# Patient Record
Sex: Female | Born: 1995 | Race: Black or African American | Hispanic: No | Marital: Single | Smoking: Never smoker
Health system: Southern US, Community
[De-identification: ages and names within clinical notes are randomized; demographics above are authoritative.]

## PROBLEM LIST (undated history)

## (undated) DIAGNOSIS — D649 Anemia, unspecified: Secondary | ICD-10-CM

## (undated) DIAGNOSIS — L732 Hidradenitis suppurativa: Secondary | ICD-10-CM

## (undated) DIAGNOSIS — R7303 Prediabetes: Secondary | ICD-10-CM

## (undated) DIAGNOSIS — J45909 Unspecified asthma, uncomplicated: Secondary | ICD-10-CM

## (undated) DIAGNOSIS — R011 Cardiac murmur, unspecified: Secondary | ICD-10-CM

## (undated) DIAGNOSIS — Z789 Other specified health status: Secondary | ICD-10-CM

## (undated) DIAGNOSIS — E7800 Pure hypercholesterolemia, unspecified: Secondary | ICD-10-CM

## (undated) HISTORY — DX: Cardiac murmur, unspecified: R01.1

## (undated) HISTORY — DX: Anemia, unspecified: D64.9

## (undated) HISTORY — PX: TONSILLECTOMY: SUR1361

## (undated) HISTORY — PX: DENTAL SURGERY: SHX609

---
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
## 2014-02-09 ENCOUNTER — Encounter (HOSPITAL_COMMUNITY): Payer: Self-pay | Admitting: *Deleted

## 2014-02-09 ENCOUNTER — Inpatient Hospital Stay (HOSPITAL_COMMUNITY)
Admission: AD | Admit: 2014-02-09 | Discharge: 2014-02-09 | Disposition: A | Discharge status: Home / Self Care | Payer: 59 | Source: Ambulatory Visit | Attending: Obstetrics & Gynecology | Admitting: Obstetrics & Gynecology

## 2014-02-09 DIAGNOSIS — R197 Diarrhea, unspecified: Secondary | ICD-10-CM | POA: Diagnosis not present

## 2014-02-09 DIAGNOSIS — R109 Unspecified abdominal pain: Secondary | ICD-10-CM

## 2014-02-09 DIAGNOSIS — Z87891 Personal history of nicotine dependence: Secondary | ICD-10-CM | POA: Insufficient documentation

## 2014-02-09 HISTORY — DX: Other specified health status: Z78.9

## 2014-02-09 LAB — URINE MICROSCOPIC-ADD ON

## 2014-02-09 LAB — CBC WITH DIFFERENTIAL/PLATELET
BASOS PCT: 0 % (ref 0–1)
Basophils Absolute: 0 10*3/uL (ref 0.0–0.1)
Eosinophils Absolute: 0 10*3/uL (ref 0.0–0.7)
Eosinophils Relative: 0 % (ref 0–5)
HCT: 35.5 % — ABNORMAL LOW (ref 36.0–46.0)
HEMOGLOBIN: 11.3 g/dL — AB (ref 12.0–15.0)
Lymphocytes Relative: 15 % (ref 12–46)
Lymphs Abs: 1.6 10*3/uL (ref 0.7–4.0)
MCH: 22.5 pg — ABNORMAL LOW (ref 26.0–34.0)
MCHC: 31.8 g/dL (ref 30.0–36.0)
MCV: 70.7 fL — ABNORMAL LOW (ref 78.0–100.0)
MONO ABS: 0.7 10*3/uL (ref 0.1–1.0)
MONOS PCT: 6 % (ref 3–12)
NEUTROS ABS: 8.3 10*3/uL — AB (ref 1.7–7.7)
Neutrophils Relative %: 79 % — ABNORMAL HIGH (ref 43–77)
Platelets: 324 10*3/uL (ref 150–400)
RBC: 5.02 MIL/uL (ref 3.87–5.11)
RDW: 16.9 % — ABNORMAL HIGH (ref 11.5–15.5)
WBC: 10.6 10*3/uL — ABNORMAL HIGH (ref 4.0–10.5)

## 2014-02-09 LAB — URINALYSIS, ROUTINE W REFLEX MICROSCOPIC
Bilirubin Urine: NEGATIVE
Glucose, UA: NEGATIVE mg/dL
Ketones, ur: NEGATIVE mg/dL
Nitrite: NEGATIVE
Protein, ur: NEGATIVE mg/dL
Specific Gravity, Urine: 1.025 (ref 1.005–1.030)
Urobilinogen, UA: 0.2 mg/dL (ref 0.0–1.0)
pH: 6 (ref 5.0–8.0)

## 2014-02-09 LAB — POCT PREGNANCY, URINE: Preg Test, Ur: NEGATIVE

## 2014-02-09 MED ORDER — KETOROLAC TROMETHAMINE 60 MG/2ML IM SOLN
60.0000 mg | Freq: Once | INTRAMUSCULAR | Status: AC
  Administered 2014-02-09: 60 mg via INTRAMUSCULAR
  Filled 2014-02-09: qty 2

## 2014-02-09 NOTE — MAU Provider Note (Signed)
History     CSN: [id number]  Arrival date and time: 02/09/14 0707   First Provider Initiated Contact with Patient 02/09/14 0759      Chief Complaint  Patient presents with  . Abdominal Pain   HPI Rebecca Nicholson 18 y.o. Client comes to MAU with abdominal pain since 10 pm yesterday.  Had 2 burgers yesterday and after eating at 9 pm had gas and then vomiting.  Has had continues lower abdominal pain since last night and has had several soft but not watery diarrhea stools.  Is not in pain while sitting up.  Did not sleep last night due to the abdominal pain.  Has had some vaginal spotting this week.  Is current on her Depo.  Denies any urinary urgency, frequency or pain with urination.  Is accompanied by her mother.  OB History   Grav Para Term Preterm Abortions TAB SAB Ect Mult Living                  Past Medical History  Diagnosis Date  . Medical history non-contributory     Past Surgical History  Procedure Laterality Date  . Tonsillectomy      Family History  Problem Relation Age of Onset  . Hypertension Mother   . Diabetes Mother     History  Substance Use Topics  . Smoking status: Former Games developer  . Smokeless tobacco: Never Used  . Alcohol Use: No    Allergies: No Known Allergies  Prescriptions prior to admission  Medication Sig Dispense Refill  . albuterol (PROVENTIL HFA;VENTOLIN HFA) 108 (90 BASE) MCG/ACT inhaler Inhale 2 puffs into the lungs every 6 (six) hours as needed for wheezing.      Marland Kitchen estradiol cypionate (DEPO-ESTRADIOL) 5 MG/ML injection Inject into the muscle every 28 (twenty-eight) days.        Review of Systems  Constitutional: Negative for fever.  Gastrointestinal: Positive for nausea, vomiting, abdominal pain and diarrhea.  Genitourinary: Negative for urgency and frequency.       No vaginal discharge. Small amount of vaginal bleeding. No dysuria.   Physical Exam   Blood pressure 131/71, pulse 85, temperature 99.1 F (37.3 C),  temperature source Oral, resp. rate 18.  Physical Exam  Nursing note and vitals reviewed. Constitutional: She is oriented to person, place, and time. She appears well-developed and well-nourished. No distress.  HENT:  Head: Normocephalic.  Eyes: EOM are normal.  Neck: Neck supple.  GI: Soft. There is no tenderness. There is no rebound and no guarding.  Musculoskeletal: Normal range of motion.  Neurological: She is alert and oriented to person, place, and time.  Skin: Skin is warm and dry.  Psychiatric: She has a normal mood and affect.    MAU Course  Procedures Results for orders placed during the hospital encounter of 02/09/14 (from the past 24 hour(s))  URINALYSIS, ROUTINE W REFLEX MICROSCOPIC     Status: Abnormal   Collection Time    02/09/14  7:15 AM      Result Value Ref Range   Color, Urine YELLOW  YELLOW   APPearance HAZY (*) CLEAR   Specific Gravity, Urine 1.025  1.005 - 1.030   pH 6.0  5.0 - 8.0   Glucose, UA NEGATIVE  NEGATIVE mg/dL   Hgb urine dipstick LARGE (*) NEGATIVE   Bilirubin Urine NEGATIVE  NEGATIVE   Ketones, ur NEGATIVE  NEGATIVE mg/dL   Protein, ur NEGATIVE  NEGATIVE mg/dL   Urobilinogen, UA 0.2  0.0 -  1.0 mg/dL   Nitrite NEGATIVE  NEGATIVE   Leukocytes, UA SMALL (*) NEGATIVE  URINE MICROSCOPIC-ADD ON     Status: Abnormal   Collection Time    02/09/14  7:15 AM      Result Value Ref Range   Squamous Epithelial / LPF MANY (*) RARE   WBC, UA 11-20  <3 WBC/hpf   RBC / HPF 0-2  <3 RBC/hpf   Bacteria, UA FEW (*) RARE   Urine-Other MUCOUS PRESENT    POCT PREGNANCY, URINE     Status: None   Collection Time    02/09/14  7:22 AM      Result Value Ref Range   Preg Test, Ur NEGATIVE  NEGATIVE  CBC WITH DIFFERENTIAL     Status: Abnormal   Collection Time    02/09/14  8:56 AM      Result Value Ref Range   WBC 10.6 (*) 4.0 - 10.5 K/uL   RBC 5.02  3.87 - 5.11 MIL/uL   Hemoglobin 11.3 (*) 12.0 - 15.0 g/dL   HCT 16.1 (*) 09.6 - 04.5 %   MCV 70.7 (*) 78.0 -  100.0 fL   MCH 22.5 (*) 26.0 - 34.0 pg   MCHC 31.8  30.0 - 36.0 g/dL   RDW 40.9 (*) 81.1 - 91.4 %   Platelets 324  150 - 400 K/uL   Neutrophils Relative % 79 (*) 43 - 77 %   Neutro Abs 8.3 (*) 1.7 - 7.7 K/uL   Lymphocytes Relative 15  12 - 46 %   Lymphs Abs 1.6  0.7 - 4.0 K/uL   Monocytes Relative 6  3 - 12 %   Monocytes Absolute 0.7  0.1 - 1.0 K/uL   Eosinophils Relative 0  0 - 5 %   Eosinophils Absolute 0.0  0.0 - 0.7 K/uL   Basophils Relative 0  0 - 1 %   Basophils Absolute 0.0  0.0 - 0.1 K/uL    MDM Discussed at length symptoms of concern - continued vomiting, body aches, fever - and BRAT diet.  Discussed seeking additional medical care with worsening symptoms.  Will give Toradol IM for pain and send client home.  Will not do pelvic today, but if symptoms continue, would then advise pelvic exam.  With no symptoms of UTI will do urine culture based on urinalysis results.    0945  Toradol is helping her pain.  Note for work given.  Assessment and Plan  Abdominal pain  Diarrhea Possible GI viral illness  Plan BRAT diet Keep well hydrated with fluids May try OTC Immodium by the package directions if diarrhea continues. Return if your symptoms worsen or if you develop fever. Urine culture pending.  Rebecca Nicholson 02/09/2014, 9:40 AM

## 2014-02-09 NOTE — MAU Note (Signed)
Pt scanned and medication scanned. Scanner beeped like it accepted medication scanned, however system did not acknowledge med was scanned.

## 2014-02-09 NOTE — MAU Note (Signed)
PT presents to MAU with complaints of pain in her lower abdomen since last night. Denies any vaginal bleeding or discharge. Reports depo shot for birth control

## 2014-02-09 NOTE — Discharge Instructions (Signed)
BRAT diet Keep well hydrated with fluids May try OTC Immodium by the package directions if diarrhea continues. Return if your symptoms worsen or if you develop fever.

## 2014-02-11 LAB — URINE CULTURE: Special Requests: NORMAL

## 2014-04-11 ENCOUNTER — Ambulatory Visit (INDEPENDENT_AMBULATORY_CARE_PROVIDER_SITE_OTHER): Payer: Self-pay | Admitting: Surgery

## 2014-04-11 NOTE — H&P (Signed)
History of Present Illness Wilmon Arms(Aleksia Freiman K. Nhu Glasby MD; 04/11/2014 10:14 AM) Patient words: eval axillary cyst.  The patient is a 18 year old female who presents with a complaint of Mass. Referred by [name], PA at [facility] for evaluation of right axillary hidradenitis This is a healthy 18 yo female who presents with several months of intermitent swelling, tenderness, and spontaneous drainage of purulent fluid from her right axilla. Currently, she can feel a couple of masses in this area, but no inflammation or drainage. She was referred for surgical evaluation. Other Problems [name]([name], [place]; 04/11/2014 9:41 AM) Asthma  Past Surgical History [name]([name], [place]; 04/11/2014 9:41 AM) Tonsillectomy  Diagnostic Studies History [name](Kendall [place], [place]; 04/11/2014 9:41 AM) Colonoscopy never Mammogram never  Allergies [name]([name], [place]; 04/11/2014 9:42 AM) No Known Drug Allergies 04/11/2014  Medication History [name]([name], [place]; 04/11/2014 9:42 AM) MedroxyPROGESTERone Acetate (150MG /ML Suspension, Intramuscular as direct) Active. Albuterol Sulfate (108 (90 Base)MCG/ACT Aero Pow Br Act, Inhalation prn) Active.  Social History [name](Kendall [place], [place]; 04/11/2014 9:41 AM) Caffeine use Carbonated beverages, Tea. No alcohol use No drug use Tobacco use Never smoker.  Family History [name](Kendall [place], [place]; 04/11/2014 9:41 AM) Breast Cancer Family Members In General. Colon Cancer Family Members In General. Diabetes Mellitus Family Members In General, Mother.  Pregnancy / Birth History [name](Kendall [place], [place]; 04/11/2014 9:41 AM) Age at menarche 12 years. Contraceptive History Depo-provera. Gravida 0 Irregular periods Para 0     Review of Systems [name](Kendall [place]; 04/11/2014 9:41 AM) General Not Present- Appetite Loss, Chills, Fatigue, Fever, Night Sweats, Weight Gain and Weight Loss. Skin Present- New Lesions. Not Present- Change in Wart/Mole, Dryness, Hives,  Jaundice, Non-Healing Wounds, Rash and Ulcer. HEENT Not Present- Earache, Hearing Loss, Hoarseness, Nose Bleed, Oral Ulcers, Ringing in the Ears, Seasonal Allergies, Sinus Pain, Sore Throat, Visual Disturbances, Wears glasses/contact lenses and Yellow Eyes. Respiratory Not Present- Bloody sputum, Chronic Cough, Difficulty Breathing, Snoring and Wheezing. Breast Not Present- Breast Mass, Breast Pain, Nipple Discharge and Skin Changes. Cardiovascular Not Present- Chest Pain, Difficulty Breathing Lying Down, Leg Cramps, Palpitations, Rapid Heart Rate, Shortness of Breath and Swelling of Extremities. Gastrointestinal Not Present- Abdominal Pain, Bloating, Bloody Stool, Change in Bowel Habits, Chronic diarrhea, Constipation, Difficulty Swallowing, Excessive gas, Gets full quickly at meals, Hemorrhoids, Indigestion, Nausea, Rectal Pain and Vomiting. Female Genitourinary Not Present- Frequency, Nocturia, Painful Urination, Pelvic Pain and Urgency. Musculoskeletal Not Present- Back Pain, Joint Pain, Joint Stiffness, Muscle Pain, Muscle Weakness and Swelling of Extremities. Neurological Not Present- Decreased Memory, Fainting, Headaches, Numbness, Seizures, Tingling, Tremor, Trouble walking and Weakness. Psychiatric Not Present- Anxiety, Bipolar, Change in Sleep Pattern, Depression, Fearful and Frequent crying. Endocrine Not Present- Cold Intolerance, Excessive Hunger, Hair Changes, Heat Intolerance, Hot flashes and New Diabetes. Hematology Not Present- Easy Bruising, Excessive bleeding, Gland problems, HIV and Persistent Infections.  Vitals [name](Kendall [place]; 04/11/2014 9:42 AM) 04/11/2014 9:42 AM Weight: 227 lb Height: 67in Body Surface Area: 2.21 m Body Mass Index: 35.55 kg/m Temp.: 98.77F(Oral)  Pulse: 60 (Regular)  Resp.: 14 (Unlabored)  BP: 116/78 (Sitting, Left Arm, Standard)     Physical Exam [name](Katoya Amato K. Syncere Eble MD; 04/11/2014 10:15 AM)  The physical exam findings are as  follows: Note:WDWN in NAD HEENT: EOMI, sclera anicteric Neck: No masses, no thyromegaly Right axilla - lipoma vs. accessory breast tissue - right axilla - 4 cm; anterior to this fatty tissue, there are three palpable subcutaneous masses, each about 1 cm. No inflammation or drainage. No tenderness. One of them  shows signs of rupture, and subsequent healing. Lungs: CTA bilaterally; normal respiratory effort CV: Regular rate and rhythm; no murmurs Abd: +bowel sounds, soft, non-tender, no masses Ext: Well-perfused; no edema Skin: Warm, dry; no sign of jaundice    Assessment & Plan [name](Iain Sawchuk K. 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 The surgical procedure has been discussed with the patient. Potential risks, benefits, alternative treatments, and expected outcomes have been explained. All of the patient's questions at this time have been answered. The likelihood of reaching the patient's treatment goal is good. The patient understand the proposed surgical procedure and wishes to proceed. Note:Currently not infected, but palpable cysts in axilla  Wilmon ArmsMatthew K. Corliss Skainssuei, MD, [facility]  General/ Trauma Surgery  04/11/2014 10:16 AM

## 2014-06-04 ENCOUNTER — Encounter (HOSPITAL_COMMUNITY): Payer: Self-pay | Admitting: *Deleted

## 2014-06-10 ENCOUNTER — Ambulatory Visit (HOSPITAL_BASED_OUTPATIENT_CLINIC_OR_DEPARTMENT_OTHER): Payer: 59 | Admitting: Anesthesiology

## 2014-06-10 ENCOUNTER — Ambulatory Visit (HOSPITAL_COMMUNITY)
Admission: RE | Admit: 2014-06-10 | Discharge: 2014-06-10 | Disposition: A | Discharge status: Home / Self Care | Payer: 59 | Source: Ambulatory Visit | Attending: Surgery | Admitting: Surgery

## 2014-06-10 ENCOUNTER — Encounter (HOSPITAL_BASED_OUTPATIENT_CLINIC_OR_DEPARTMENT_OTHER): Payer: Self-pay | Admitting: Anesthesiology

## 2014-06-10 ENCOUNTER — Encounter (HOSPITAL_BASED_OUTPATIENT_CLINIC_OR_DEPARTMENT_OTHER)
Admission: RE | Disposition: A | Discharge status: Home / Self Care | Payer: Self-pay | Source: Ambulatory Visit | Attending: Surgery

## 2014-06-10 DIAGNOSIS — Z79899 Other long term (current) drug therapy: Secondary | ICD-10-CM | POA: Diagnosis not present

## 2014-06-10 DIAGNOSIS — J45909 Unspecified asthma, uncomplicated: Secondary | ICD-10-CM | POA: Diagnosis not present

## 2014-06-10 DIAGNOSIS — L732 Hidradenitis suppurativa: Secondary | ICD-10-CM | POA: Insufficient documentation

## 2014-06-10 DIAGNOSIS — R2231 Localized swelling, mass and lump, right upper limb: Secondary | ICD-10-CM | POA: Diagnosis present

## 2014-06-10 HISTORY — DX: Hidradenitis suppurativa: L73.2

## 2014-06-10 HISTORY — DX: Unspecified asthma, uncomplicated: J45.909

## 2014-06-10 HISTORY — PX: HYDRADENITIS EXCISION: SHX5243

## 2014-06-10 LAB — POCT HEMOGLOBIN-HEMACUE: Hemoglobin: 11.7 g/dL — ABNORMAL LOW (ref 12.0–15.0)

## 2014-06-10 SURGERY — EXCISION, HIDRADENITIS, AXILLA
Anesthesia: General | Site: Axilla | Laterality: Right

## 2014-06-10 MED ORDER — CEFAZOLIN SODIUM-DEXTROSE 2-3 GM-% IV SOLR: 2.0000 g | INTRAVENOUS | Status: DC

## 2014-06-10 MED ORDER — SCOPOLAMINE 1 MG/3DAYS TD PT72
MEDICATED_PATCH | TRANSDERMAL | Status: AC
  Filled 2014-06-10: qty 1

## 2014-06-10 MED ORDER — SCOPOLAMINE 1 MG/3DAYS TD PT72
1.0000 | MEDICATED_PATCH | TRANSDERMAL | Status: DC
  Administered 2014-06-10: 1.5 mg via TRANSDERMAL

## 2014-06-10 MED ORDER — ONDANSETRON HCL 4 MG/2ML IJ SOLN: 4.0000 mg | INTRAMUSCULAR | Status: DC | PRN

## 2014-06-10 MED ORDER — OXYCODONE HCL 5 MG/5ML PO SOLN: 5.0000 mg | ORAL | Status: DC | PRN

## 2014-06-10 MED ORDER — FENTANYL CITRATE 0.05 MG/ML IJ SOLN: 50.0000 ug | INTRAMUSCULAR | Status: DC | PRN

## 2014-06-10 MED ORDER — MORPHINE SULFATE 2 MG/ML IJ SOLN: 2.0000 mg | INTRAMUSCULAR | Status: DC | PRN

## 2014-06-10 MED ORDER — HYDROMORPHONE HCL 1 MG/ML IJ SOLN: 0.2500 mg | INTRAMUSCULAR | Status: DC | PRN

## 2014-06-10 MED ORDER — LACTATED RINGERS IV SOLN
INTRAVENOUS | Status: DC
  Administered 2014-06-10 (×2): via INTRAVENOUS

## 2014-06-10 MED ORDER — CEFAZOLIN SODIUM-DEXTROSE 2-3 GM-% IV SOLR
INTRAVENOUS | Status: DC | PRN
  Administered 2014-06-10: 2 g via INTRAVENOUS

## 2014-06-10 MED ORDER — FENTANYL CITRATE 0.05 MG/ML IJ SOLN
INTRAMUSCULAR | Status: DC | PRN
  Administered 2014-06-10 (×4): 50 ug via INTRAVENOUS

## 2014-06-10 MED ORDER — MIDAZOLAM HCL 5 MG/5ML IJ SOLN
INTRAMUSCULAR | Status: DC | PRN
  Administered 2014-06-10: 2 mg via INTRAVENOUS

## 2014-06-10 MED ORDER — OXYCODONE HCL 5 MG/5ML PO SOLN: 5.0000 mg | Freq: Once | ORAL | Status: DC | PRN

## 2014-06-10 MED ORDER — MIDAZOLAM HCL 2 MG/2ML IJ SOLN: 1.0000 mg | INTRAMUSCULAR | Status: DC | PRN

## 2014-06-10 MED ORDER — CEFAZOLIN SODIUM-DEXTROSE 2-3 GM-% IV SOLR
INTRAVENOUS | Status: AC
  Filled 2014-06-10: qty 50

## 2014-06-10 MED ORDER — LIDOCAINE HCL (CARDIAC) 20 MG/ML IV SOLN
INTRAVENOUS | Status: DC | PRN
  Administered 2014-06-10: 80 mg via INTRAVENOUS

## 2014-06-10 MED ORDER — DEXAMETHASONE SODIUM PHOSPHATE 4 MG/ML IJ SOLN
INTRAMUSCULAR | Status: DC | PRN
  Administered 2014-06-10: 10 mg via INTRAVENOUS

## 2014-06-10 MED ORDER — ONDANSETRON HCL 4 MG/2ML IJ SOLN: 4.0000 mg | Freq: Once | INTRAMUSCULAR | Status: DC | PRN

## 2014-06-10 MED ORDER — OXYCODONE HCL 5 MG PO TABS: 5.0000 mg | ORAL_TABLET | Freq: Once | ORAL | Status: DC | PRN

## 2014-06-10 MED ORDER — CHLORHEXIDINE GLUCONATE 4 % EX LIQD: 1.0000 "application " | Freq: Once | CUTANEOUS | Status: DC

## 2014-06-10 MED ORDER — FENTANYL CITRATE 0.05 MG/ML IJ SOLN
INTRAMUSCULAR | Status: AC
  Filled 2014-06-10: qty 6

## 2014-06-10 MED ORDER — MIDAZOLAM HCL 2 MG/2ML IJ SOLN
INTRAMUSCULAR | Status: AC
  Filled 2014-06-10: qty 2

## 2014-06-10 MED ORDER — HYDROCODONE-ACETAMINOPHEN 7.5-325 MG/15ML PO SOLN: 15.0000 mL | Freq: Four times a day (QID) | ORAL | Status: AC | PRN

## 2014-06-10 MED ORDER — PROPOFOL 10 MG/ML IV EMUL
INTRAVENOUS | Status: AC
  Filled 2014-06-10: qty 50

## 2014-06-10 MED ORDER — PROPOFOL 10 MG/ML IV BOLUS
INTRAVENOUS | Status: AC
  Filled 2014-06-10: qty 80

## 2014-06-10 MED ORDER — BUPIVACAINE-EPINEPHRINE 0.25% -1:200000 IJ SOLN
INTRAMUSCULAR | Status: DC | PRN
  Administered 2014-06-10: 10 mL

## 2014-06-10 MED ORDER — ONDANSETRON HCL 4 MG/2ML IJ SOLN
INTRAMUSCULAR | Status: DC | PRN
  Administered 2014-06-10: 4 mg via INTRAVENOUS

## 2014-06-10 MED ORDER — PROPOFOL 10 MG/ML IV BOLUS
INTRAVENOUS | Status: DC | PRN
  Administered 2014-06-10: 50 mg via INTRAVENOUS
  Administered 2014-06-10: 200 mg via INTRAVENOUS
  Administered 2014-06-10: 50 mg via INTRAVENOUS

## 2014-06-10 SURGICAL SUPPLY — 44 items
BENZOIN TINCTURE PRP APPL 2/3 (GAUZE/BANDAGES/DRESSINGS) ×3 IMPLANT
BLADE HEX COATED 2.75 (ELECTRODE) ×3 IMPLANT
BLADE SURG 15 STRL LF DISP TIS (BLADE) ×1 IMPLANT
BLADE SURG 15 STRL SS (BLADE) ×2
CANISTER SUCT 1200ML W/VALVE (MISCELLANEOUS) ×3 IMPLANT
CHLORAPREP W/TINT 26ML (MISCELLANEOUS) ×3 IMPLANT
CLOSURE WOUND 1/2 X4 (GAUZE/BANDAGES/DRESSINGS) ×1
COVER BACK TABLE 60X90IN (DRAPES) ×3 IMPLANT
COVER MAYO STAND STRL (DRAPES) ×3 IMPLANT
DECANTER SPIKE VIAL GLASS SM (MISCELLANEOUS) IMPLANT
DRAPE PED LAPAROTOMY (DRAPES) ×3 IMPLANT
DRAPE UTILITY XL STRL (DRAPES) ×3 IMPLANT
DRSG TEGADERM 2-3/8X2-3/4 SM (GAUZE/BANDAGES/DRESSINGS) ×3 IMPLANT
DRSG TEGADERM 4X4.75 (GAUZE/BANDAGES/DRESSINGS) ×3 IMPLANT
ELECT REM PT RETURN 9FT ADLT (ELECTROSURGICAL) ×3
ELECTRODE REM PT RTRN 9FT ADLT (ELECTROSURGICAL) ×1 IMPLANT
GAUZE XEROFORM 1X8 LF (GAUZE/BANDAGES/DRESSINGS) IMPLANT
GLOVE BIO SURGEON STRL SZ7 (GLOVE) ×3 IMPLANT
GLOVE BIOGEL PI IND STRL 7.0 (GLOVE) ×1 IMPLANT
GLOVE BIOGEL PI IND STRL 7.5 (GLOVE) ×1 IMPLANT
GLOVE BIOGEL PI INDICATOR 7.0 (GLOVE) ×2
GLOVE BIOGEL PI INDICATOR 7.5 (GLOVE) ×2
GLOVE ECLIPSE 6.5 STRL STRAW (GLOVE) ×3 IMPLANT
GOWN STRL REUS W/ TWL LRG LVL3 (GOWN DISPOSABLE) ×2 IMPLANT
GOWN STRL REUS W/TWL LRG LVL3 (GOWN DISPOSABLE) ×4
NEEDLE HYPO 25X1 1.5 SAFETY (NEEDLE) ×3 IMPLANT
NS IRRIG 1000ML POUR BTL (IV SOLUTION) ×3 IMPLANT
PACK BASIN DAY SURGERY FS (CUSTOM PROCEDURE TRAY) ×3 IMPLANT
PENCIL BUTTON HOLSTER BLD 10FT (ELECTRODE) ×3 IMPLANT
SLEEVE SCD COMPRESS KNEE MED (MISCELLANEOUS) ×3 IMPLANT
SPONGE GAUZE 4X4 12PLY STER LF (GAUZE/BANDAGES/DRESSINGS) ×3 IMPLANT
SPONGE LAP 4X18 X RAY DECT (DISPOSABLE) ×3 IMPLANT
STRIP CLOSURE SKIN 1/2X4 (GAUZE/BANDAGES/DRESSINGS) ×2 IMPLANT
SUT ETHILON 3 0 PS 1 (SUTURE) IMPLANT
SUT MON AB 4-0 PC3 18 (SUTURE) ×3 IMPLANT
SUT VIC AB 3-0 SH 27 (SUTURE) ×2
SUT VIC AB 3-0 SH 27X BRD (SUTURE) ×1 IMPLANT
SYR BULB 3OZ (MISCELLANEOUS) IMPLANT
SYR CONTROL 10ML LL (SYRINGE) ×3 IMPLANT
TOWEL OR 17X24 6PK STRL BLUE (TOWEL DISPOSABLE) ×3 IMPLANT
TOWEL OR NON WOVEN STRL DISP B (DISPOSABLE) IMPLANT
TUBE CONNECTING 20'X1/4 (TUBING) ×1
TUBE CONNECTING 20X1/4 (TUBING) ×2 IMPLANT
YANKAUER SUCT BULB TIP NO VENT (SUCTIONS) ×3 IMPLANT

## 2014-06-10 NOTE — Transfer of Care (Signed)
Immediate Anesthesia Transfer of Care Note  Patient: Rebecca LeitzKristen Imani Drohan  Procedure(s) Performed: Procedure(s): EXCISION OF RIGHT AXILLARY HIDRADENITIS  (Right)  Patient Location: PACU  Anesthesia Type:General  Level of Consciousness: sedated and patient cooperative  Airway & Oxygen Therapy: Patient Spontanous Breathing and Patient connected to face mask oxygen  Post-op Assessment: Report given to PACU RN and Post -op Vital signs reviewed and stable  Post vital signs: Reviewed and stable  Complications: No apparent anesthesia complications

## 2014-06-10 NOTE — Op Note (Signed)
Pre-op Diagnosis:  Hidradenitis - right axilla Post-op Diagnosis:  Same Procedure:  Excision of hidradenitis - right axilla Surgeon:  Donnisha Besecker K. Anesthesia:  Gen - LMA Indications:  19 yo female with intermittent swelling, tenderness, and purulent drainage from her right axilla.  She was examined and several small subcutaneous masses were palpated.  She presents now for excision of this area.  Description of procedure:  The patient was brought to the operating room and placed in a supine position on the operating room table.  After an adequate level of anesthesia was obtained, her right axilla was prepped with Chloraprep and draped in sterile fashion. A timeout was taken to ensure the proper patient and proper procedure. We made an elliptical incision around the chronic scar tissue in the axilla. This resulted in the decision about 3/2 cm long. We excised all of the underlying scar tissue down to normal-appearing fatty tissue. The patient has some excess accessory breast tissue in the axilla that is protruding significantly. We excised some of this to try to make her axilla a little more flat. We inspected carefully for hemostasis. All of the excess tissue was sent for pathologic examination. The wound was closed with multiple deep 3-0 Vicryl sutures. 4 Monocryl was used close the skin. Steri-Strips and clean dressing were applied. The patient was brought to recovery in stable condition. All sponge, initially, and needle counts are correct.   Wilmon ArmsMatthew K. Corliss Skainssuei, MD, [facility]  General/ Trauma Surgery  06/10/2014 1:37 PM

## 2014-06-10 NOTE — H&P (Signed)
History of Present Illness Rebecca Nicholson(Rebecca Nicholson K. Rebecca Slifer MD; 04/11/2014 10:14 AM) Patient words: eval axillary cyst.  The patient is a 19 year old female who presents with a complaint of Mass. Referred by Rebecca LeberElmira Powell, PA at [facility] for evaluation of right axillary hidradenitis This is a healthy 19 yo female who presents with several months of intermitent swelling, tenderness, and spontaneous drainage of purulent fluid from her right axilla. Currently, she can feel a couple of masses in this area, but no inflammation or drainage. She was referred for surgical evaluation. Other Problems Rebecca Nicholson(Rebecca Nicholson, Rebecca Nicholson; 04/11/2014 9:41 AM) Asthma  Past Surgical History Rebecca Nicholson(Rebecca Nicholson, Rebecca Nicholson; 04/11/2014 9:41 AM) Tonsillectomy  Diagnostic Studies History Rebecca Nicholson(Rebecca AshleyWarmath, Rebecca Nicholson; 04/11/2014 9:41 AM) Colonoscopy never Mammogram never  Allergies Rebecca Nicholson(Rebecca Warmath, MA; 04/11/2014 9:42 AM) No Known Drug Allergies 04/11/2014  Medication History Rebecca Nicholson(Rebecca Warmath, MA; 04/11/2014 9:42 AM) MedroxyPROGESTERone Acetate (150MG /ML Suspension, Intramuscular as direct) Active. Albuterol Sulfate (108 (90 Base)MCG/ACT Aero Pow Br Act, Inhalation prn) Active.  Social History Rebecca Nicholson(Rebecca PoloWarmath, Rebecca Nicholson; 04/11/2014 9:41 AM) Caffeine use Carbonated beverages, Tea. No alcohol use No drug use Tobacco use Never smoker.  Family History Rebecca Nicholson(Rebecca ShiroWarmath, Rebecca Nicholson; 04/11/2014 9:41 AM) Breast Cancer Family Members In General. Colon Cancer Family Members In General. Diabetes Mellitus Family Members In General, Mother.  Pregnancy / Birth History Rebecca Nicholson(Rebecca NobleWarmath, Rebecca Nicholson; 04/11/2014 9:41 AM) Age at menarche 12 years. Contraceptive History Depo-provera. Gravida 0 Irregular periods Para 0     Review of Systems Rebecca Nicholson(Rebecca ValmyWarmath MA; 04/11/2014 9:41 AM) General Not Present- Appetite Loss, Chills, Fatigue, Fever, Night Sweats, Weight Gain and Weight Loss. Skin Present- New Lesions. Not Present- Change in Wart/Mole, Dryness, Hives,  Jaundice, Non-Healing Wounds, Rash and Ulcer. HEENT Not Present- Earache, Hearing Loss, Hoarseness, Nose Bleed, Oral Ulcers, Ringing in the Ears, Seasonal Allergies, Sinus Pain, Sore Throat, Visual Disturbances, Wears glasses/contact lenses and Yellow Eyes. Respiratory Not Present- Bloody sputum, Chronic Cough, Difficulty Breathing, Snoring and Wheezing. Breast Not Present- Breast Mass, Breast Pain, Nipple Discharge and Skin Changes. Cardiovascular Not Present- Chest Pain, Difficulty Breathing Lying Down, Leg Cramps, Palpitations, Rapid Heart Rate, Shortness of Breath and Swelling of Extremities. Gastrointestinal Not Present- Abdominal Pain, Bloating, Bloody Stool, Change in Bowel Habits, Chronic diarrhea, Constipation, Difficulty Swallowing, Excessive gas, Gets full quickly at meals, Hemorrhoids, Indigestion, Nausea, Rectal Pain and Vomiting. Female Genitourinary Not Present- Frequency, Nocturia, Painful Urination, Pelvic Pain and Urgency. Musculoskeletal Not Present- Back Pain, Joint Pain, Joint Stiffness, Muscle Pain, Muscle Weakness and Swelling of Extremities. Neurological Not Present- Decreased Memory, Fainting, Headaches, Numbness, Seizures, Tingling, Tremor, Trouble walking and Weakness. Psychiatric Not Present- Anxiety, Bipolar, Change in Sleep Pattern, Depression, Fearful and Frequent crying. Endocrine Not Present- Cold Intolerance, Excessive Hunger, Hair Changes, Heat Intolerance, Hot flashes and New Diabetes. Hematology Not Present- Easy Bruising, Excessive bleeding, Gland problems, HIV and Persistent Infections.  Vitals Rebecca Nicholson(Rebecca North LakesWarmath MA; 04/11/2014 9:42 AM) 04/11/2014 9:42 AM Weight: 227 lb Height: 67in Body Surface Area: 2.21 m Body Mass Index: 35.55 kg/m Temp.: 98.69F(Oral)  Pulse: 60 (Regular)  Resp.: 14 (Unlabored)  BP: 116/78 (Sitting, Left Arm, Standard)     Physical Exam Rebecca Nicholson(Rebecca Nicholson K. Rebecca Kazanjian MD; 04/11/2014 10:15 AM)  The physical exam findings are as  follows: Note:WDWN in NAD HEENT: EOMI, sclera anicteric Neck: No masses, no thyromegaly Right axilla - lipoma vs. accessory breast tissue - right axilla - 4 cm; anterior to this fatty tissue, there are three palpable subcutaneous masses, each about 1 cm. No inflammation or drainage. No tenderness. One of them  shows signs of rupture, and subsequent healing. Lungs: CTA bilaterally; normal respiratory effort CV: Regular rate and rhythm; no murmurs Abd: +bowel sounds, soft, non-tender, no masses Ext: Well-perfused; no edema Skin: Warm, dry; no sign of jaundice    Assessment & Plan Rebecca Nicholson K. 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 The surgical procedure has been discussed with the patient. Potential risks, benefits, alternative treatments, and expected outcomes have been explained. All of the patient's questions at this time have been answered. The likelihood of reaching the patient's treatment goal is good. The patient understand the proposed surgical procedure and wishes to proceed. Note:Currently not infected, but palpable cysts in axilla   Rebecca Nicholson. Rebecca Skains, MD, [facility]  General/ Trauma Surgery  06/10/2014 12:37 PM

## 2014-06-10 NOTE — Anesthesia Procedure Notes (Signed)
Procedure Name: LMA Insertion Date/Time: 06/10/2014 12:48 PM Performed by: Genevieve NorlanderLINKA, Tarquin Welcher L Pre-anesthesia Checklist: Patient identified, Emergency Drugs available, Suction available, Patient being monitored and Timeout performed Patient Re-evaluated:Patient Re-evaluated prior to inductionOxygen Delivery Method: Circle System Utilized Preoxygenation: Pre-oxygenation with 100% oxygen Intubation Type: IV induction Ventilation: Mask ventilation without difficulty LMA: LMA inserted LMA Size: 4.0 Number of attempts: 1 Airway Equipment and Method: bite block Placement Confirmation: positive ETCO2 Tube secured with: Tape Dental Injury: Teeth and Oropharynx as per pre-operative assessment

## 2014-06-10 NOTE — Anesthesia Preprocedure Evaluation (Signed)
Anesthesia Evaluation  Patient identified by MRN, date of birth, ID band Patient awake    Reviewed: Allergy & Precautions, NPO status , Patient's Chart, lab work & pertinent test results  Airway Mallampati: I  TM Distance: >3 FB Neck ROM: Full    Dental  (+) Teeth Intact, Dental Advisory Given   Pulmonary asthma ,  breath sounds clear to auscultation        Cardiovascular Rhythm:Regular Rate:Normal     Neuro/Psych    GI/Hepatic   Endo/Other    Renal/GU      Musculoskeletal   Abdominal   Peds  Hematology   Anesthesia Other Findings   Reproductive/Obstetrics                             Anesthesia Physical Anesthesia Plan  ASA: II  Anesthesia Plan: General   Post-op Pain Management:    Induction: Intravenous  Airway Management Planned: LMA  Additional Equipment:   Intra-op Plan:   Post-operative Plan: Extubation in OR  Informed Consent: I have reviewed the patients History and Physical, chart, labs and discussed the procedure including the risks, benefits and alternatives for the proposed anesthesia with the patient or authorized representative who has indicated his/her understanding and acceptance.   Dental advisory given  Plan Discussed with: CRNA, Anesthesiologist and Surgeon  Anesthesia Plan Comments:         Anesthesia Quick Evaluation  

## 2014-06-10 NOTE — Discharge Instructions (Signed)
[facility] Office Phone Number [phone]   POST OP INSTRUCTIONS  Always review your discharge instruction sheet given to you by the facility where your surgery was performed.  IF YOU HAVE DISABILITY OR FAMILY LEAVE FORMS, YOU MUST BRING THEM TO THE OFFICE FOR PROCESSING.  DO NOT GIVE THEM TO YOUR DOCTOR.  1. A prescription for pain medication may be given to you upon discharge.  Take your pain medication as prescribed, if needed.  If narcotic pain medicine is not needed, then you may take acetaminophen (Tylenol) or ibuprofen (Advil) as needed. 2. Take your usually prescribed medications unless otherwise directed 3. If you need a refill on your pain medication, please contact your pharmacy.  They will contact our office to request authorization.  Prescriptions will not be filled after 5pm or on week-ends. 4. You should eat very light the first 24 hours after surgery, such as soup, crackers, pudding, etc.  Resume your normal diet the day after surgery. 5. Most patients will experience some swelling and bruising in the axilla.  Ice packs will help.  Swelling and bruising can take several days to resolve.  6. It is common to experience some constipation if taking pain medication after surgery.  Increasing fluid intake and taking a stool softener will usually help or prevent this problem from occurring.  A mild laxative (Milk of Magnesia or Miralax) should be taken according to package directions if there are no bowel movements after 48 hours. 7. Unless discharge instructions indicate otherwise, you may remove your bandages 24-48 hours after surgery, and you may shower at that time.  You may have steri-strips (small skin tapes) in place directly over the incision.  These strips should be left on the skin for 7-10 days.  If your surgeon used skin glue on the incision, you may shower in 24 hours.  The glue will flake off over the next 2-3 weeks.  Any sutures or staples will be removed at  the office during your follow-up visit. 8. ACTIVITIES:  You may resume regular daily activities (gradually increasing) beginning the next day.   a. You may drive when you no longer are taking prescription pain medication, you can comfortably wear a seatbelt, and you can safely maneuver your car and apply brakes. b. RETURN TO WORK:  ______________________________________________________________________________________ 9. You should see your doctor in the office for a follow-up appointment approximately two weeks after your surgery.  Your doctors nurse will typically make your follow-up appointment when she calls you with your pathology report.  Expect your pathology report 2-3 business days after your surgery.  You may call to check if you do not hear from us after three days. 10. OTHER INSTRUCTIONS: _______________________________________________________________________________________________ _____________________________________________________________________________________________________________________________________ _____________________________________________________________________________________________________________________________________ _____________________________________________________________________________________________________________________________________  WHEN TO CALL YOUR DOCTOR: 1. Fever over 101.0 2. Nausea and/or vomiting. 3. Extreme swelling or bruising. 4. Continued bleeding from incision. 5. Increased pain, redness, or drainage from the incision.  The clinic staff is available to answer your questions during regular business hours.  Please dont hesitate to call and ask to speak to one of the nurses for clinical concerns.  If you have a medical emergency, go to the nearest emergency room or call 911.  [facility] from [facility] is always on call at the hospital.  For further questions, please visit [email or web address]     Post  Anesthesia Home Care Instructions  Activity: Get plenty of rest for the remainder of the day. A responsible adult should stay with you for 24  hours following the procedure.  For the next 24 hours, DO NOT: -Drive a car -Paediatric nurse -Drink alcoholic beverages -Take any medication unless instructed by your physician -Make any legal decisions or sign important papers.  Meals: Start with liquid foods such as gelatin or soup. Progress to regular foods as tolerated. Avoid greasy, spicy, heavy foods. If nausea and/or vomiting occur, drink only clear liquids until the nausea and/or vomiting subsides. Call your physician if vomiting continues.  Special Instructions/Symptoms: Your throat may feel dry or sore from the anesthesia or the breathing tube placed in your throat during surgery. If this causes discomfort, gargle with warm salt water. The discomfort should disappear within 24 hours.

## 2014-06-10 NOTE — Anesthesia Postprocedure Evaluation (Signed)
  Anesthesia Post-op Note  Patient: Rebecca LeitzKristen Imani Nicholson  Procedure(s) Performed: Procedure(s): EXCISION OF RIGHT AXILLARY HIDRADENITIS  (Right)  Patient Location: PACU  Anesthesia Type: General   Level of Consciousness: awake, alert  and oriented  Airway and Oxygen Therapy: Patient Spontanous Breathing  Post-op Pain: none  Post-op Assessment: Post-op Vital signs reviewed  Post-op Vital Signs: Reviewed  Last Vitals:  Filed Vitals:   06/10/14 1430  BP: 126/81  Pulse: 74  Temp:   Resp: 17    Complications: No apparent anesthesia complications

## 2014-06-11 ENCOUNTER — Encounter (HOSPITAL_BASED_OUTPATIENT_CLINIC_OR_DEPARTMENT_OTHER): Payer: Self-pay | Admitting: Surgery

## 2014-10-16 ENCOUNTER — Other Ambulatory Visit (HOSPITAL_COMMUNITY)
Admission: RE | Admit: 2014-10-16 | Discharge: 2014-10-16 | Disposition: A | Discharge status: Home / Self Care | Payer: 59 | Source: Ambulatory Visit | Attending: Emergency Medicine | Admitting: Emergency Medicine

## 2014-10-16 ENCOUNTER — Emergency Department (HOSPITAL_COMMUNITY)
Admission: EM | Admit: 2014-10-16 | Discharge: 2014-10-16 | Disposition: A | Discharge status: Home / Self Care | Payer: 59 | Source: Home / Self Care | Attending: Emergency Medicine | Admitting: Emergency Medicine

## 2014-10-16 ENCOUNTER — Encounter (HOSPITAL_COMMUNITY): Payer: Self-pay | Admitting: Emergency Medicine

## 2014-10-16 DIAGNOSIS — N760 Acute vaginitis: Secondary | ICD-10-CM | POA: Insufficient documentation

## 2014-10-16 DIAGNOSIS — L298 Other pruritus: Secondary | ICD-10-CM

## 2014-10-16 DIAGNOSIS — N898 Other specified noninflammatory disorders of vagina: Secondary | ICD-10-CM

## 2014-10-16 LAB — POCT URINALYSIS DIP (DEVICE)
Bilirubin Urine: NEGATIVE
Glucose, UA: NEGATIVE mg/dL
Ketones, ur: NEGATIVE mg/dL
Nitrite: NEGATIVE
PH: 7 (ref 5.0–8.0)
PROTEIN: 30 mg/dL — AB
Specific Gravity, Urine: 1.02 (ref 1.005–1.030)
Urobilinogen, UA: 1 mg/dL (ref 0.0–1.0)

## 2014-10-16 LAB — POCT PREGNANCY, URINE: Preg Test, Ur: NEGATIVE

## 2014-10-16 MED ORDER — FLUCONAZOLE 40 MG/ML PO SUSR: 150.0000 mg | Freq: Once | ORAL | Status: DC

## 2014-10-16 NOTE — Discharge Instructions (Signed)
You likely have a yeast infection. Take the Diflucan dose today. Repeat the dose in 3 days if you are still having itching. If no improvement after 2 doses, follow-up with your primary care doctor.

## 2014-10-16 NOTE — ED Provider Notes (Addendum)
CSN: [id number]     Arrival date & time 10/16/14  1714 History   First MD Initiated Contact with Patient 10/16/14 1754     Chief Complaint  Patient presents with  . Vaginal Itching   (Consider location/radiation/quality/duration/timing/severity/associated sxs/prior Treatment) HPI  She is a 19 year old girl here for evaluation of vaginal itching. She states this started 3-4 days ago. She reports itching and feeling irritated. She denies any discharge. No dysuria or abdominal pain. No fevers or chills. No new sexual partners. She has been using an over-the-counter cream without improvement.  Past Medical History  Diagnosis Date  . Medical history non-contributory   . Asthma     as child  . Hidradenitis suppurativa of right axilla    Past Surgical History  Procedure Laterality Date  . Tonsillectomy    . Hydradenitis excision Right 06/10/2014    Procedure: EXCISION OF RIGHT AXILLARY HIDRADENITIS ;  Surgeon: Manus RuddMatthew Tsuei, MD;  Location: [facility];  Service: General;  Laterality: Right;   Family History  Problem Relation Age of Onset  . Hypertension Mother   . Diabetes Mother    History  Substance Use Topics  . Smoking status: Never Smoker   . Smokeless tobacco: Never Used  . Alcohol Use: No   OB History    No data available     Review of Systems As in history of present illness Allergies  Review of patient's allergies indicates no known allergies.  Home Medications   Prior to Admission medications   Medication Sig Start Date End Date Taking? Authorizing Provider  estradiol cypionate (DEPO-ESTRADIOL) 5 MG/ML injection Inject into the muscle every 28 (twenty-eight) days.   Yes Historical Provider, MD  albuterol (PROVENTIL HFA;VENTOLIN HFA) 108 (90 BASE) MCG/ACT inhaler Inhale 2 puffs into the lungs every 6 (six) hours as needed for wheezing.    Historical Provider, MD  fluconazole (DIFLUCAN) 40 MG/ML suspension Take 3.8 mLs (152 mg total) by mouth once.  10/16/14   Charm RingsErin J Tamyra Fojtik, MD  HYDROcodone-acetaminophen (HYCET) 7.5-325 mg/15 ml solution Take 15 mLs by mouth 4 (four) times daily as needed for moderate pain. 06/10/14 06/10/15  Manus RuddMatthew Tsuei, MD   BP 125/68 mmHg  Pulse 77  Temp(Src) 98.9 F (37.2 C) (Oral)  Resp 14  SpO2 100% Physical Exam  Constitutional: She is oriented to person, place, and time. She appears well-developed and well-nourished. No distress.  Pulmonary/Chest: Effort normal.  Genitourinary:  Introitus is erythematous. No discharge seen. Blind swabs collected.  Neurological: She is alert and oriented to person, place, and time.    ED Course  Procedures (including critical care time) Labs Review Labs Reviewed  POCT URINALYSIS DIP (DEVICE) - Abnormal; Notable for the following:    Hgb urine dipstick MODERATE (*)    Protein, ur 30 (*)    Leukocytes, UA LARGE (*)    All other components within normal limits  URINE CULTURE  POCT PREGNANCY, URINE  CERVICOVAGINAL ANCILLARY ONLY    Imaging Review No results found.   MDM   1. Vaginal itching    We'll treat with Diflucan for likely yeast infection. Follow-up as needed.    Charm RingsErin J Larwence Tu, MD 10/16/14 1830  Labs came back positive for Candida and BV. Given her lack of discharge, will not treat for BV. She was adequately treated with Diflucan.  Charm RingsErin J Savir Blanke, MD 10/21/14 [id number]

## 2014-10-16 NOTE — ED Notes (Signed)
Pt states she has been having vaginal itching and burning for 2 or 3 days.  She denies any other symptoms at this time, no fevers, no pain with urination, and no discharge.

## 2014-10-17 LAB — CERVICOVAGINAL ANCILLARY ONLY: Wet Prep (BD Affirm): POSITIVE — AB

## 2014-10-17 NOTE — ED Notes (Signed)
Candida and Gardnerella pos.,  Urine culture: pending.  Pt. adequately treated with Diflucan.  mMsssage sent to Dr. Piedad ClimesHonig. 10/17/2014

## 2014-10-18 LAB — URINE CULTURE
COLONY COUNT: NO GROWTH
Culture: NO GROWTH

## 2015-02-03 ENCOUNTER — Encounter (HOSPITAL_COMMUNITY): Payer: Self-pay | Admitting: *Deleted

## 2015-02-03 ENCOUNTER — Emergency Department (HOSPITAL_COMMUNITY)
Admission: EM | Admit: 2015-02-03 | Discharge: 2015-02-03 | Disposition: A | Discharge status: Home / Self Care | Payer: 59 | Attending: Emergency Medicine | Admitting: Emergency Medicine

## 2015-02-03 DIAGNOSIS — J45909 Unspecified asthma, uncomplicated: Secondary | ICD-10-CM | POA: Diagnosis not present

## 2015-02-03 DIAGNOSIS — Z3202 Encounter for pregnancy test, result negative: Secondary | ICD-10-CM | POA: Insufficient documentation

## 2015-02-03 DIAGNOSIS — R45851 Suicidal ideations: Secondary | ICD-10-CM | POA: Diagnosis present

## 2015-02-03 DIAGNOSIS — Z7289 Other problems related to lifestyle: Secondary | ICD-10-CM

## 2015-02-03 DIAGNOSIS — W272XXA Contact with scissors, initial encounter: Secondary | ICD-10-CM | POA: Insufficient documentation

## 2015-02-03 DIAGNOSIS — Y9289 Other specified places as the place of occurrence of the external cause: Secondary | ICD-10-CM | POA: Diagnosis not present

## 2015-02-03 DIAGNOSIS — IMO0002 Reserved for concepts with insufficient information to code with codable children: Secondary | ICD-10-CM

## 2015-02-03 DIAGNOSIS — T1491 Suicide attempt: Secondary | ICD-10-CM | POA: Diagnosis not present

## 2015-02-03 DIAGNOSIS — Y9389 Activity, other specified: Secondary | ICD-10-CM | POA: Diagnosis not present

## 2015-02-03 DIAGNOSIS — Y998 Other external cause status: Secondary | ICD-10-CM | POA: Diagnosis not present

## 2015-02-03 DIAGNOSIS — Z79899 Other long term (current) drug therapy: Secondary | ICD-10-CM | POA: Insufficient documentation

## 2015-02-03 LAB — HCG, QUANTITATIVE, PREGNANCY: HCG, BETA CHAIN, QUANT, S: 1 m[IU]/mL (ref <5)

## 2015-02-03 LAB — ACETAMINOPHEN LEVEL: Acetaminophen (Tylenol), Serum: <10 ug/mL — ABNORMAL LOW (ref 10–30)

## 2015-02-03 LAB — RAPID URINE DRUG SCREEN, HOSP PERFORMED
Amphetamines: NOT DETECTED
BARBITURATES: NOT DETECTED
Benzodiazepines: NOT DETECTED
Cocaine: NOT DETECTED
Opiates: NOT DETECTED
TETRAHYDROCANNABINOL: NOT DETECTED

## 2015-02-03 LAB — COMPREHENSIVE METABOLIC PANEL
ALBUMIN: 4 g/dL (ref 3.5–5.0)
ALK PHOS: 57 U/L (ref 38–126)
ALT: 15 U/L (ref 14–54)
AST: 15 U/L (ref 15–41)
Anion gap: 9 (ref 5–15)
BILIRUBIN TOTAL: 0.3 mg/dL (ref 0.3–1.2)
BUN: 13 mg/dL (ref 6–20)
CALCIUM: 9.8 mg/dL (ref 8.9–10.3)
CO2: 25 mmol/L (ref 22–32)
Chloride: 104 mmol/L (ref 101–111)
Creatinine, Ser: 0.68 mg/dL (ref 0.44–1.00)
GFR calc Af Amer: >60 mL/min (ref >60)
GFR calc non Af Amer: >60 mL/min (ref >60)
GLUCOSE: 110 mg/dL — AB (ref 65–99)
Potassium: 3.4 mmol/L — ABNORMAL LOW (ref 3.5–5.1)
Sodium: 138 mmol/L (ref 135–145)
TOTAL PROTEIN: 7.2 g/dL (ref 6.5–8.1)

## 2015-02-03 LAB — CBC
HEMATOCRIT: 37.3 % (ref 36.0–46.0)
Hemoglobin: 11.6 g/dL — ABNORMAL LOW (ref 12.0–15.0)
MCH: 21.8 pg — ABNORMAL LOW (ref 26.0–34.0)
MCHC: 31.1 g/dL (ref 30.0–36.0)
MCV: 70 fL — ABNORMAL LOW (ref 78.0–100.0)
Platelets: 350 10*3/uL (ref 150–400)
RBC: 5.33 MIL/uL — ABNORMAL HIGH (ref 3.87–5.11)
RDW: 17.1 % — AB (ref 11.5–15.5)
WBC: 8.2 10*3/uL (ref 4.0–10.5)

## 2015-02-03 LAB — ETHANOL: Alcohol, Ethyl (B): <5 mg/dL (ref <5)

## 2015-02-03 LAB — SALICYLATE LEVEL: Salicylate Lvl: <4 mg/dL (ref 2.8–30.0)

## 2015-02-03 NOTE — ED Notes (Signed)
Pt denies any SI/HI at this time.

## 2015-02-03 NOTE — ED Notes (Signed)
Pt went home with family. Pt and family have all belongings.

## 2015-02-03 NOTE — ED Notes (Signed)
Pt is here with family and friend and reports she tried to hurt herself by cutting her left arm.  There are no lacerations or break in skin.  Pt states she does not have a lot of thoughts of hurting herself.  Her friend reports she had a panic attack last nite and was hyperventilating,  Says she has been having thoughts for a couple of days.  No hallucination or visions

## 2015-02-03 NOTE — Discharge Instructions (Signed)
Suicidal Feelings, How to Help Yourself °Everyone feels sad or unhappy at times, but depressing thoughts and feelings of hopelessness can lead to thoughts of suicide. It can seem as if life is too tough to handle. If you feel as though you have reached the point where suicide is the only answer, it is time to let someone know immediately.  °HOW TO COPE AND PREVENT SUICIDE °· Let family, friends, teachers, or counselors know. Get help. Try not to isolate yourself from those who care about you. Even though you may not feel sociable, talk with someone every day. It is best if it is face-to-face. Remember, they will want to help you. °· Eat a regularly spaced and well-balanced diet. °· Get plenty of rest. °· Avoid alcohol and drugs because they will only make you feel worse and may also lower your inhibitions. Remove them from the home. If you are thinking of taking an overdose of your prescribed medicines, give your medicines to someone who can give them to you one day at a time. If you are on antidepressants, let your caregiver know of your feelings so he or she can provide a safer medicine, if that is a concern. °· Remove weapons or poisons from your home. °· Try to stick to routines. Follow a schedule and remind yourself that you have to keep that schedule every day. °· Set some realistic goals and achieve them. Make a list and cross things off as you go. Accomplishments give a sense of worth. Wait until you are feeling better before doing things you find difficult or unpleasant to do. °· If you are able, try to start exercising. Even half-hour periods of exercise each day will make you feel better. Getting out in the sun or into nature helps you recover from depression faster. If you have a favorite place to walk, take advantage of that. °· Increase safe activities that have always given you pleasure. This may include playing your favorite music, reading a good book, painting a picture, or playing your favorite  instrument. Do whatever takes your mind off your depression. °· Keep your living space well-lighted. °GET HELP °Contact a suicide hotline, crisis center, or local suicide prevention center for help right away. Local centers may include a hospital, clinic, community service organization, social service provider, or health department. °· Call your local emergency services (911 in the [place]). °· Call a suicide hotline: °¨ 1-800-273-TALK (1-800-273-8255) in the [place]. °¨ 1-800-SUICIDE (1-800-784-2433) in the [place]. °¨ 1-888-628-9454 in the [place] for Spanish-speaking counselors. °¨ 1-800-799-4TTY (1-800-799-4889) in the [place] for TTY users. °· Visit the following websites for information and help: °¨ National Suicide Prevention Lifeline: www.suicidepreventionlifeline.org °¨ Hopeline: www.hopeline.com °¨ American Foundation for Suicide Prevention: www.afsp.org °· For lesbian, gay, bisexual, transgender, or questioning youth, contact The [employer]: °¨ 1-866-4-U-TREVOR (1-866-488-7386) in the [place]. °¨ www.thetrevorproject.org °· In [place], treatment resources are listed in each province with listings available under [employer] or similar titles. Another source for Crisis Centres by Province is located at http://www.suicideprevention.[place]/in-crisis-now/find-a-crisis-centre-now/crisis-centres °Document Released: 11/20/2002 Document Revised: 08/08/2011 Document Reviewed: 09/10/2013 °ExitCare® Patient Information ©2015 [employer]. This information is not intended to replace advice given to you by your health care provider. Make sure you discuss any questions you have with your health care provider. ° °

## 2015-02-03 NOTE — ED Provider Notes (Signed)
CSN: [id number]     Arrival date & time 02/03/15  1552 History   First MD Initiated Contact with Patient 02/03/15 1723     Chief Complaint  Patient presents with  . Suicidal     (Consider location/radiation/quality/duration/timing/severity/associated sxs/prior Treatment) Patient is a 19 y.o. female presenting with mental health disorder.  Mental Health Problem Presenting symptoms: self mutilation and suicidal thoughts   Presenting symptoms: no hallucinations   Patient accompanied by:  Family member Degree of incapacity (severity):  Moderate Onset quality:  Gradual Duration:  2 weeks Timing:  Constant Progression:  Worsening Chronicity:  New Context: stressful life event   Treatment compliance:  Untreated Relieved by:  None tried Worsened by:  Family interactions Ineffective treatments:  None tried Associated symptoms: appetite change   Associated symptoms: no abdominal pain, no chest pain, no feelings of worthlessness, no headaches, no hypersomnia, no insomnia and no school problems   Associated symptoms comment:  Tearfulness Risk factors: no family hx of mental illness, no hx of mental illness and no hx of suicide attempts     Past Medical History  Diagnosis Date  . Medical history non-contributory   . Asthma     as child  . Hidradenitis suppurativa of right axilla    Past Surgical History  Procedure Laterality Date  . Tonsillectomy    . Hydradenitis excision Right 06/10/2014    Procedure: EXCISION OF RIGHT AXILLARY HIDRADENITIS ;  Surgeon: Manus Rudd, MD;  Location: [facility];  Service: General;  Laterality: Right;   Family History  Problem Relation Age of Onset  . Hypertension Mother   . Diabetes Mother    Social History  Substance Use Topics  . Smoking status: Never Smoker   . Smokeless tobacco: Never Used  . Alcohol Use: No   OB History    No data available     Review of Systems  Constitutional: Positive for appetite change. Negative  for fever and chills.  HENT: Negative for congestion and sore throat.   Eyes: Negative for visual disturbance.  Respiratory: Negative for cough, shortness of breath and wheezing.   Cardiovascular: Negative for chest pain.  Gastrointestinal: Negative for nausea, vomiting, abdominal pain, diarrhea and constipation.  Genitourinary: Negative for dysuria, difficulty urinating and vaginal pain.  Musculoskeletal: Negative for myalgias and arthralgias.  Skin: Negative for rash.  Neurological: Negative for syncope and headaches.  Psychiatric/Behavioral: Positive for suicidal ideas and self-injury. Negative for hallucinations and behavioral problems. The patient does not have insomnia.   All other systems reviewed and are negative.     Allergies  Review of patient's allergies indicates no known allergies.  Home Medications   Prior to Admission medications   Medication Sig Start Date End Date Taking? Authorizing Provider  albuterol (PROVENTIL HFA;VENTOLIN HFA) 108 (90 BASE) MCG/ACT inhaler Inhale 2 puffs into the lungs every 6 (six) hours as needed for wheezing.   Yes Historical Provider, MD  Aspirin-Acetaminophen-Caffeine (GOODYS EXTRA STRENGTH) 520-260-32.5 MG PACK Take 1 Package by mouth daily as needed (pain).   Yes Historical Provider, MD  medroxyPROGESTERone (DEPO-PROVERA) 150 MG/ML injection Inject 150 mg into the muscle every 3 (three) months.   Yes Historical Provider, MD  fluconazole (DIFLUCAN) 40 MG/ML suspension Take 3.8 mLs (152 mg total) by mouth once. Patient not taking: Reported on 02/03/2015 10/16/14   Charm Rings, MD  HYDROcodone-acetaminophen (HYCET) 7.5-325 mg/15 ml solution Take 15 mLs by mouth 4 (four) times daily as needed for moderate pain. Patient not taking:  Reported on 02/03/2015 06/10/14 06/10/15  Manus Rudd, MD   BP 120/73 mmHg  Pulse 82  Temp(Src) 98.9 F (37.2 C) (Oral)  Resp 18  SpO2 100% Physical Exam  Constitutional: She is oriented to person, place, and  time. She appears well-developed and well-nourished. No distress.  HENT:  Head: Normocephalic and atraumatic.  Eyes: EOM are normal.  Neck: Normal range of motion.  Cardiovascular: Normal rate, regular rhythm and normal heart sounds.   No murmur heard. Pulmonary/Chest: Effort normal and breath sounds normal. No respiratory distress. She has no wheezes.  Abdominal: Soft. There is no tenderness.  Musculoskeletal: She exhibits no edema.  Neurological: She is alert and oriented to person, place, and time.  Skin: She is not diaphoretic.  Psychiatric: She has a normal mood and affect. Her speech is normal and behavior is normal. She expresses no homicidal and no suicidal ideation. She expresses no suicidal plans and no homicidal plans.    ED Course  Procedures (including critical care time) Labs Review Labs Reviewed  COMPREHENSIVE METABOLIC PANEL - Abnormal; Notable for the following:    Potassium 3.4 (*)    Glucose, Bld 110 (*)    All other components within normal limits  ACETAMINOPHEN LEVEL - Abnormal; Notable for the following:    Acetaminophen (Tylenol), Serum <10 (*)    All other components within normal limits  CBC - Abnormal; Notable for the following:    RBC 5.33 (*)    Hemoglobin 11.6 (*)    MCV 70.0 (*)    MCH 21.8 (*)    RDW 17.1 (*)    All other components within normal limits  ETHANOL  SALICYLATE LEVEL  HCG, QUANTITATIVE, PREGNANCY  URINE RAPID DRUG SCREEN, HOSP PERFORMED    Imaging Review No results found. I have personally reviewed and evaluated these images and lab results as part of my medical decision-making.   EKG Interpretation None      MDM   Final diagnoses:  Self-inflicted injury     Patient is a 19 year old female that presents with self-harm. 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 Patient denies any suicidal thought. Patient took scissors today and try to  cut her left wrist however there is no outward signs of trauma. Patient states that she felt this way today however is been very tearful and lost her appetite over the past 2 weeks. Patient has no history of mental illness and no family history mental illness. The patient's sister and mother were in the room. Patient denies SI or HI or hallucinations. Given the patient's contract for safety and the family's willingness to watch her and is felt that at this time she is able to follow up as an outpatient and was given psychiatric resources.    Beverely Risen, MD 02/03/15 1806  Laurence Spates, MD 02/04/15 [phone]

## 2015-07-25 IMAGING — CR DG LUMBAR SPINE COMPLETE 4+V
5 series · 5 of 5 positions shown · non-contrast
Comparison: None.

CLINICAL DATA: Pain post trauma

EXAM:
LUMBAR SPINE - COMPLETE 4+ VIEW

[view not recorded (1 of 5)]
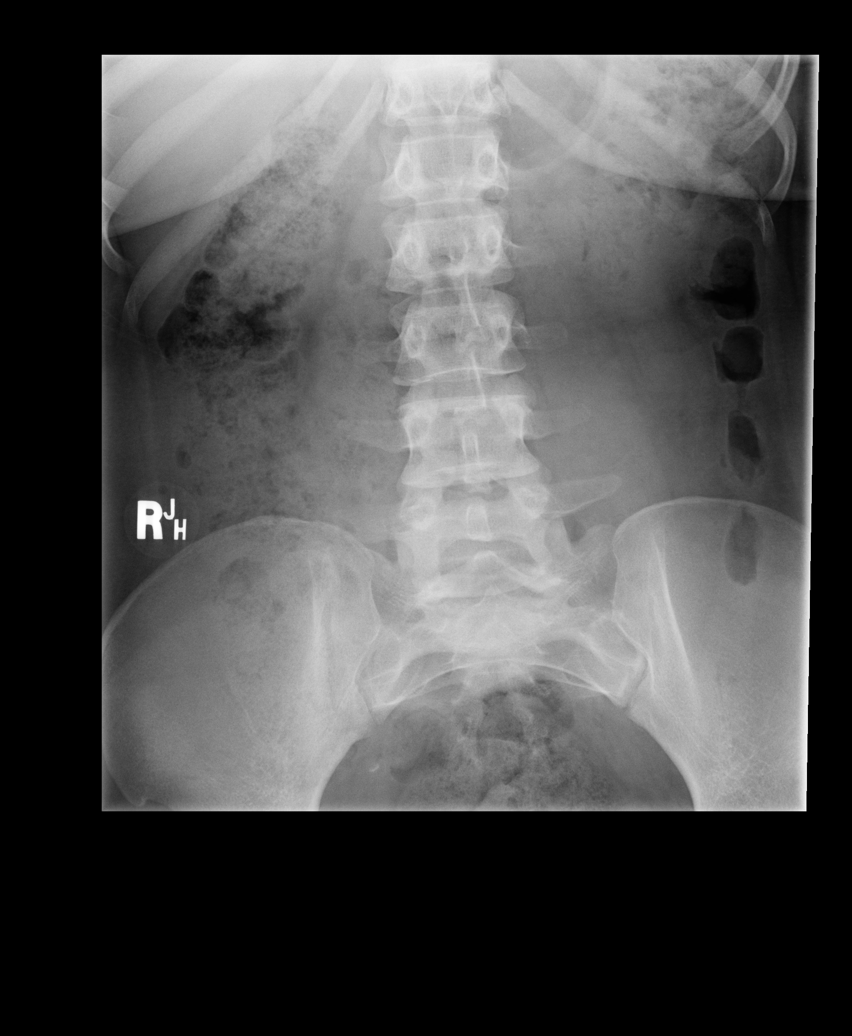

[view not recorded (2 of 5)]
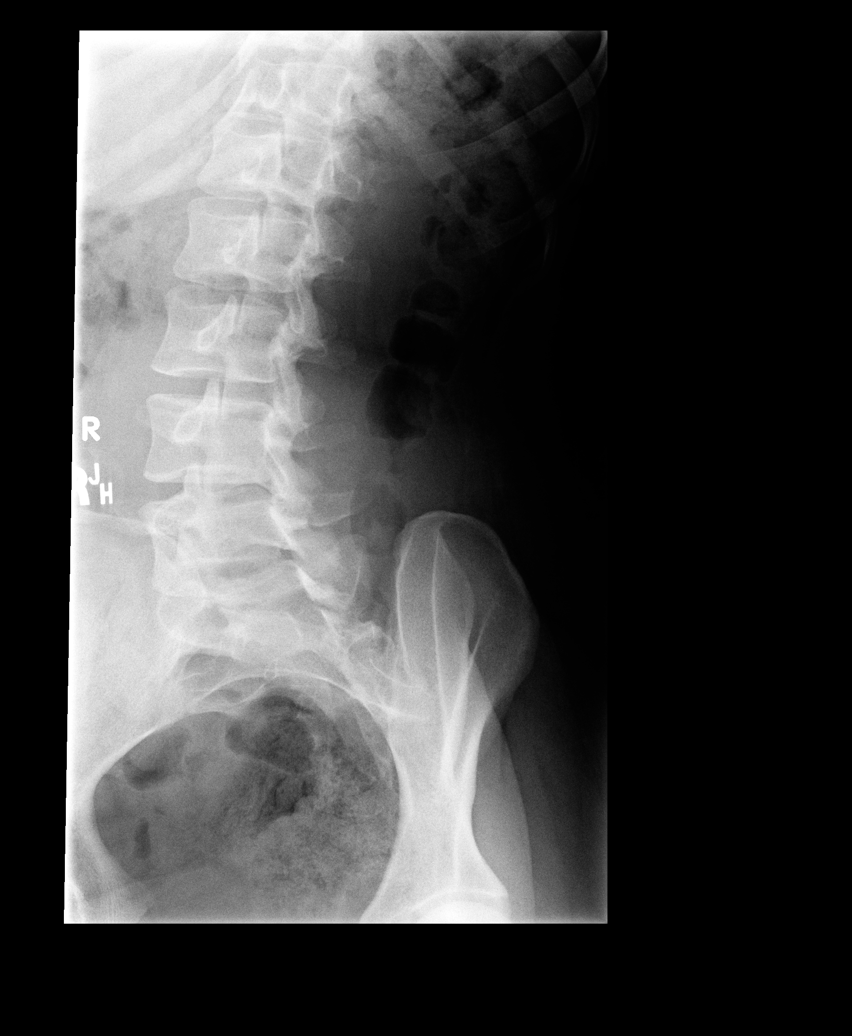

[view not recorded (3 of 5)]
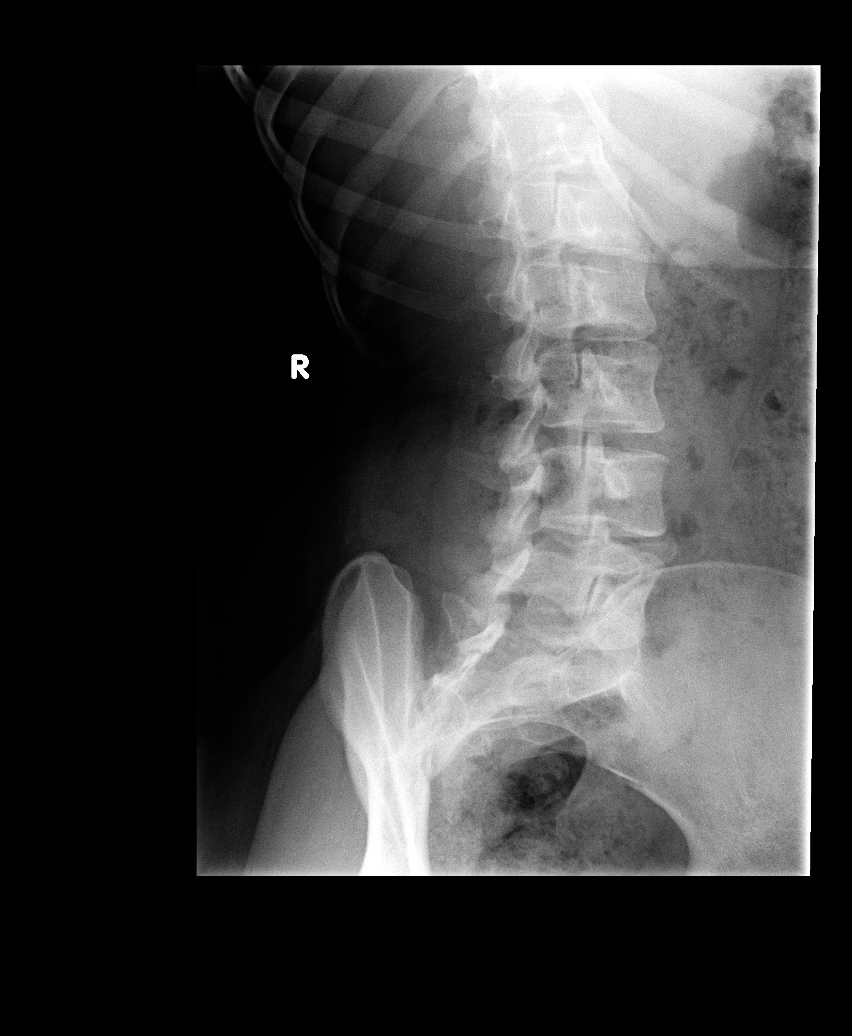

[view not recorded (4 of 5)]
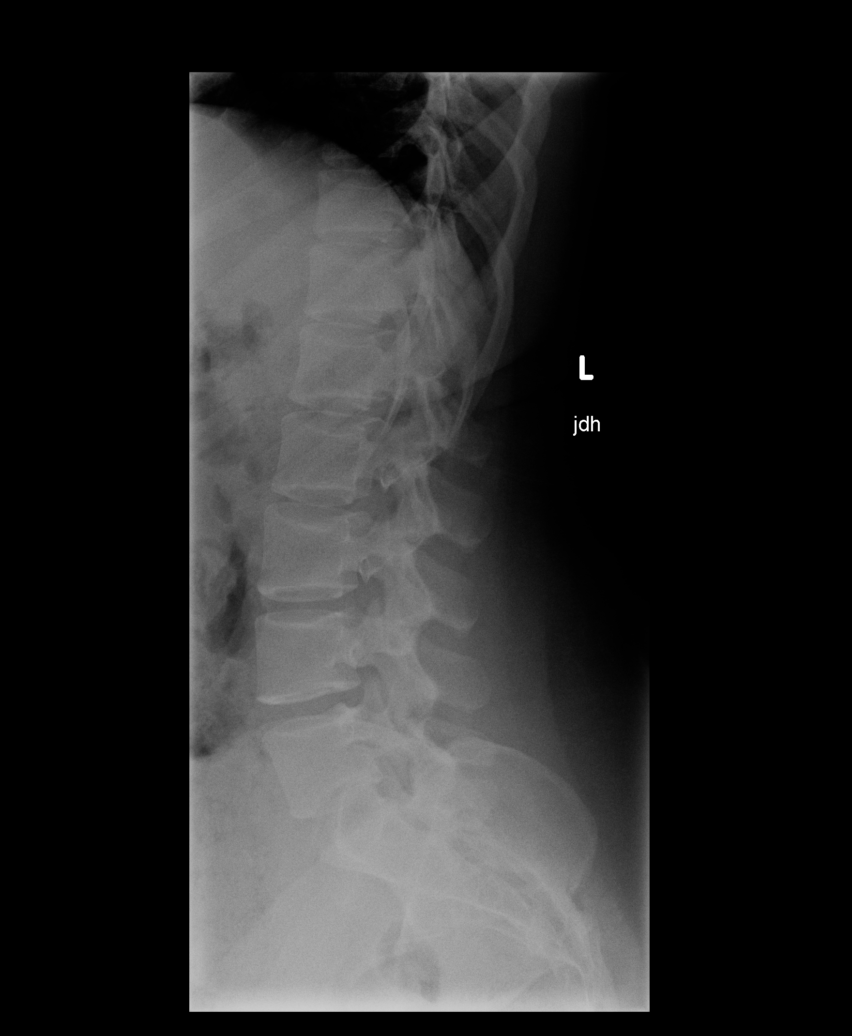

[view not recorded (5 of 5)]
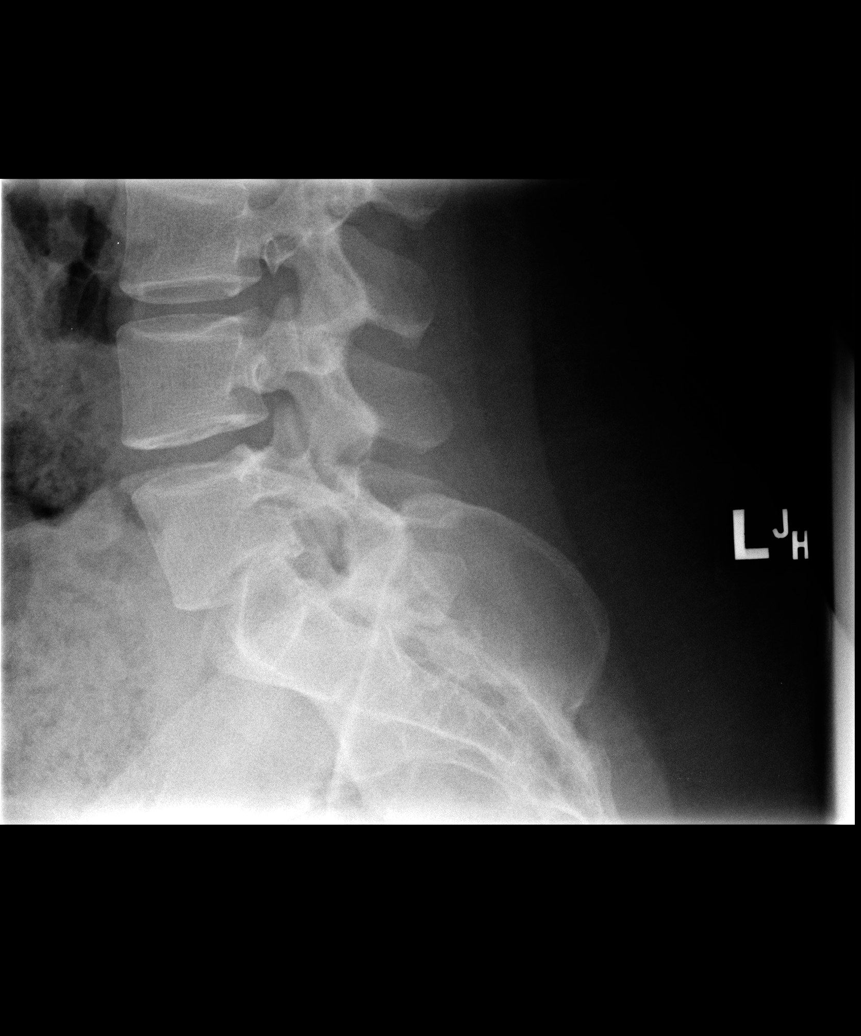

[5 of 5 positions shown; findings below may reference images not displayed]

FINDINGS: Frontal, lateral, spot lumbosacral lateral, and bilateral oblique
views were obtained. There are 5 non-rib-bearing lumbar type
vertebral bodies. There is no fracture or spondylolisthesis. Disk
spaces appear intact. There is no appreciable facet arthropathy.
IMPRESSION: No fracture or spondylolisthesis. No appreciable arthropathy.

## 2016-02-14 ENCOUNTER — Encounter (HOSPITAL_COMMUNITY): Payer: Self-pay

## 2016-02-14 DIAGNOSIS — J45909 Unspecified asthma, uncomplicated: Secondary | ICD-10-CM | POA: Diagnosis not present

## 2016-02-14 DIAGNOSIS — Z7982 Long term (current) use of aspirin: Secondary | ICD-10-CM | POA: Diagnosis not present

## 2016-02-14 DIAGNOSIS — R509 Fever, unspecified: Secondary | ICD-10-CM | POA: Diagnosis present

## 2016-02-14 DIAGNOSIS — R000 Tachycardia, unspecified: Secondary | ICD-10-CM | POA: Insufficient documentation

## 2016-02-14 DIAGNOSIS — B349 Viral infection, unspecified: Secondary | ICD-10-CM | POA: Diagnosis not present

## 2016-02-14 LAB — CBC WITH DIFFERENTIAL/PLATELET
BASOS ABS: 0 10*3/uL (ref 0.0–0.1)
BASOS PCT: 0 %
EOS ABS: 0 10*3/uL (ref 0.0–0.7)
Eosinophils Relative: 0 %
HCT: 38.1 % (ref 36.0–46.0)
HEMOGLOBIN: 12 g/dL (ref 12.0–15.0)
Lymphocytes Relative: 13 %
Lymphs Abs: 1.1 10*3/uL (ref 0.7–4.0)
MCH: 22.3 pg — ABNORMAL LOW (ref 26.0–34.0)
MCHC: 31.5 g/dL (ref 30.0–36.0)
MCV: 70.7 fL — ABNORMAL LOW (ref 78.0–100.0)
MONO ABS: 0.5 10*3/uL (ref 0.1–1.0)
MONOS PCT: 6 %
NEUTROS PCT: 81 %
Neutro Abs: 6.5 10*3/uL (ref 1.7–7.7)
Platelets: 343 10*3/uL (ref 150–400)
RBC: 5.39 MIL/uL — ABNORMAL HIGH (ref 3.87–5.11)
RDW: 16.5 % — AB (ref 11.5–15.5)
WBC: 8.1 10*3/uL (ref 4.0–10.5)

## 2016-02-14 LAB — COMPREHENSIVE METABOLIC PANEL
ALT: 16 U/L (ref 14–54)
ANION GAP: 9 (ref 5–15)
AST: 17 U/L (ref 15–41)
Albumin: 3.9 g/dL (ref 3.5–5.0)
Alkaline Phosphatase: 52 U/L (ref 38–126)
BILIRUBIN TOTAL: 0.2 mg/dL — AB (ref 0.3–1.2)
BUN: 9 mg/dL (ref 6–20)
CHLORIDE: 103 mmol/L (ref 101–111)
CO2: 21 mmol/L — ABNORMAL LOW (ref 22–32)
CREATININE: 0.75 mg/dL (ref 0.44–1.00)
Calcium: 9.5 mg/dL (ref 8.9–10.3)
Glucose, Bld: 137 mg/dL — ABNORMAL HIGH (ref 65–99)
Potassium: 3.4 mmol/L — ABNORMAL LOW (ref 3.5–5.1)
Sodium: 133 mmol/L — ABNORMAL LOW (ref 135–145)
Total Protein: 7.1 g/dL (ref 6.5–8.1)

## 2016-02-14 LAB — URINALYSIS, ROUTINE W REFLEX MICROSCOPIC
BILIRUBIN URINE: NEGATIVE
Glucose, UA: 500 mg/dL — AB
Hgb urine dipstick: NEGATIVE
KETONES UR: NEGATIVE mg/dL
Leukocytes, UA: NEGATIVE
NITRITE: NEGATIVE
Protein, ur: NEGATIVE mg/dL
Specific Gravity, Urine: 1.029 (ref 1.005–1.030)
pH: 7.5 (ref 5.0–8.0)

## 2016-02-14 LAB — I-STAT BETA HCG BLOOD, ED (MC, WL, AP ONLY)

## 2016-02-14 LAB — I-STAT CG4 LACTIC ACID, ED: LACTIC ACID, VENOUS: 1.7 mmol/L (ref 0.5–1.9)

## 2016-02-14 MED ORDER — ACETAMINOPHEN 325 MG PO TABS
650.0000 mg | ORAL_TABLET | Freq: Once | ORAL | Status: AC
  Administered 2016-02-14: 650 mg via ORAL

## 2016-02-14 MED ORDER — ONDANSETRON 4 MG PO TBDP
4.0000 mg | ORAL_TABLET | Freq: Once | ORAL | Status: DC
  Filled 2016-02-14: qty 1

## 2016-02-14 MED ORDER — ACETAMINOPHEN 160 MG/5ML PO SOLN
ORAL | Status: AC
  Filled 2016-02-14: qty 20.3

## 2016-02-14 NOTE — ED Triage Notes (Signed)
Patient complains of generalized body aches, fever, chills and headache since am. States that she vomited x 1 after taking tylenol early this afternoon. Alert and oriented, NAD

## 2016-02-15 ENCOUNTER — Emergency Department (HOSPITAL_COMMUNITY)
Admission: EM | Admit: 2016-02-15 | Discharge: 2016-02-15 | Disposition: A | Discharge status: Home / Self Care | Payer: Commercial Managed Care - HMO | Attending: Emergency Medicine | Admitting: Emergency Medicine

## 2016-02-15 DIAGNOSIS — B349 Viral infection, unspecified: Secondary | ICD-10-CM

## 2016-02-15 DIAGNOSIS — R509 Fever, unspecified: Secondary | ICD-10-CM

## 2016-02-15 DIAGNOSIS — R000 Tachycardia, unspecified: Secondary | ICD-10-CM

## 2016-02-15 LAB — MONONUCLEOSIS SCREEN: MONO SCREEN: NEGATIVE

## 2016-02-15 MED ORDER — SODIUM CHLORIDE 0.9 % IV BOLUS (SEPSIS)
2000.0000 mL | Freq: Once | INTRAVENOUS | Status: AC
  Administered 2016-02-15: 2000 mL via INTRAVENOUS

## 2016-02-15 MED ORDER — SODIUM CHLORIDE 0.9 % IV BOLUS (SEPSIS)
500.0000 mL | INTRAVENOUS | Status: AC
  Administered 2016-02-15: 500 mL via INTRAVENOUS

## 2016-02-15 MED ORDER — IBUPROFEN 100 MG/5ML PO SUSP
600.0000 mg | Freq: Once | ORAL | Status: AC
  Administered 2016-02-15: 600 mg via ORAL
  Filled 2016-02-15: qty 30

## 2016-02-15 NOTE — ED Provider Notes (Signed)
[facility]-EM[facility]ENCY DEPT Provider Note   CSN: [id number] Arrival date & time: 02/14/16  2230     History   Chief Complaint Chief Complaint  Patient presents with  . Fever  . Generalized Body Aches  . Chills    HPI Rebecca Nicholson is a 20 y.o. female with a hx of asthma, hidradenitis suppurative presents to the Emergency Department complaining of gradual, persistent, progressively worsening fever to 103, body aches, emesis x1 onset 8am. Associated symptoms include chills.  Pt reports taking tylenol prior to arrival, but this did not help as she vomited it.  Pt reports emesis was NBNB.  She denies abd pain.  No aggravating or alleviating factors.  Pt denies cough, congestion, otalgia, CP, SOB, abd pain, dysuria, hematuria.  Denies travel outside the country.  No tick bites.  Patient denies flare of hidradenitis.      The history is provided by the patient and medical records. No language interpreter was used.    Past Medical History:  Diagnosis Date  . Asthma    as child  . Hidradenitis suppurativa of right axilla   . Medical history non-contributory     There are no active problems to display for this patient.   Past Surgical History:  Procedure Laterality Date  . HYDRADENITIS EXCISION Right 06/10/2014   Procedure: EXCISION OF RIGHT AXILLARY HIDRADENITIS ;  Surgeon: Manus Rudd, MD;  Location: [facility];  Service: General;  Laterality: Right;  . TONSILLECTOMY      OB History    No data available       Home Medications    Prior to Admission medications   Medication Sig Start Date End Date Taking? Authorizing Provider  albuterol (PROVENTIL HFA;VENTOLIN HFA) 108 (90 BASE) MCG/ACT inhaler Inhale 2 puffs into the lungs every 6 (six) hours as needed for wheezing.    Historical Provider, MD  Aspirin-Acetaminophen-Caffeine (GOODYS EXTRA STRENGTH) 520-260-32.5 MG PACK Take 1 Package by mouth daily as needed (pain).    Historical Provider, MD  fluconazole  (DIFLUCAN) 40 MG/ML suspension Take 3.8 mLs (152 mg total) by mouth once. Patient not taking: Reported on 02/03/2015 10/16/14   Charm Rings, MD  medroxyPROGESTERone (DEPO-PROVERA) 150 MG/ML injection Inject 150 mg into the muscle every 3 (three) months.    Historical Provider, MD    Family History Family History  Problem Relation Age of Onset  . Hypertension Mother   . Diabetes Mother     Social History Social History  Substance Use Topics  . Smoking status: Never Smoker  . Smokeless tobacco: Never Used  . Alcohol use No     Allergies   Review of patient's allergies indicates no known allergies.   Review of Systems Review of Systems  Constitutional: Positive for fever.  HENT: Positive for congestion and sore throat.   Respiratory: Negative for cough, chest tightness, shortness of breath and wheezing.   Cardiovascular: Negative for chest pain.  Gastrointestinal: Positive for nausea and vomiting. Negative for abdominal pain and diarrhea.  Endocrine: Negative for polydipsia, polyphagia and polyuria.  Genitourinary: Negative for difficulty urinating, flank pain, frequency and urgency.  Musculoskeletal: Positive for myalgias.  Skin: Negative for rash.  Neurological: Positive for light-headedness ( resolved).  All other systems reviewed and are negative.    Physical Exam Updated Vital Signs BP 121/74   Pulse 97   Temp 99.1 F (37.3 C) (Oral)   Resp 12   SpO2 97%   Physical Exam  Constitutional: She appears well-developed  and well-nourished. No distress.  Awake, alert, nontoxic appearance  HENT:  Head: Normocephalic and atraumatic.  Mouth/Throat: Oropharynx is clear and moist. No oropharyngeal exudate.  Eyes: Conjunctivae are normal. No scleral icterus.  Neck: Normal range of motion. Neck supple. No spinous process tenderness and no muscular tenderness present. No neck rigidity. Normal range of motion present.  Cardiovascular: Regular rhythm and intact distal pulses.   Tachycardia present.   Pulses:      Radial pulses are 2+ on the right side, and 2+ on the left side.       Dorsalis pedis pulses are 2+ on the right side, and 2+ on the left side.  Pulmonary/Chest: Effort normal and breath sounds normal. No respiratory distress. She has no wheezes.  Equal chest expansion No focal breath sounds.  Abdominal: Soft. Bowel sounds are normal. She exhibits no mass. There is no tenderness. There is no rebound and no guarding.  Soft and nontender  Musculoskeletal: Normal range of motion. She exhibits no edema.  Neurological: She is alert.  Speech is clear and goal oriented Moves extremities without ataxia  Skin: Skin is warm and dry. No rash noted. She is not diaphoretic.  Psychiatric: She has a normal mood and affect.  Nursing note and vitals reviewed.    ED Treatments / Results  Labs (all labs ordered are listed, but only abnormal results are displayed) Labs Reviewed  COMPREHENSIVE METABOLIC PANEL - Abnormal; Notable for the following:       Result Value   Sodium 133 (*)    Potassium 3.4 (*)    CO2 21 (*)    Glucose, Bld 137 (*)    Total Bilirubin 0.2 (*)    All other components within normal limits  CBC WITH DIFFERENTIAL/PLATELET - Abnormal; Notable for the following:    RBC 5.39 (*)    MCV 70.7 (*)    MCH 22.3 (*)    RDW 16.5 (*)    All other components within normal limits  URINALYSIS, ROUTINE W REFLEX MICROSCOPIC (NOT AT [facility]) - Abnormal; Notable for the following:    Glucose, UA 500 (*)    All other components within normal limits  I-STAT BETA HCG BLOOD, ED ([facility], [facility], [facility] ONLY)  I-STAT CG4 LACTIC ACID, ED    Radiology No results found.  Procedures Procedures (including critical care time)  Medications Ordered in ED Medications  ondansetron (ZOFRAN-ODT) disintegrating tablet 4 mg ( Oral Canceled Entry 02/15/16 0142)  acetaminophen (TYLENOL) 160 MG/5ML solution (  Canceled Entry 02/15/16 0142)  acetaminophen (TYLENOL) tablet 650 mg (650  mg Oral Given by Other 02/14/16 2243)     Initial Impression / Assessment and Plan / ED Course  I have reviewed the triage vital signs and the nursing notes.  Pertinent labs & imaging results that were available during my care of the patient were reviewed by me and considered in my medical decision making (see chart for details).  Clinical Course  Value Comment By Time  WBC: 8.1 No leukocytosis or anemia Dierdre Forth, PA-C 09/18 0243  Potassium: (!) 3.4 Mild hypokalemia Dierdre Forth, PA-C 09/18 0244  Anion gap: 9 WNL Lateria Alderman, PA-C 09/18 0244  I-stat hCG, quantitative: <5.0 neg Dierdre Forth, PA-C 09/18 0244  Lactic Acid, Venous: 1.70 WNL Dierdre Forth, PA-C 09/18 0244  Leukocytes, UA: NEGATIVE No UTI Dierdre Forth, PA-C 09/18 0244  Mono Screen: NEGATIVE Monoscreen negative. Dahlia Client Bryann Mcnealy, PA-C 09/18 0418  BP: 127/80 Improving vital signs. Dahlia Client Celene Pippins, PA-C 09/18 [phone]  Fever 101.3. Ibuprofen ordered. Will give some additional fluid. Patient is tolerating by mouth and appears well. Persistent tachycardia is likely secondary to persistent fever. Dahlia ClientHannah Chinedu Agustin, PA-C 09/18 0555    Pt with persistent tachycardia; likely 2/2 persistent fever.  Concern for potential influenza. Patient is without risk factors for DVT. No clinical signs of DVT. The same, estrogen usage. 3 mobilization. She has no chest pain or shortness of breath.  And is without petechiae or purpura, nuchal rigidity or headache to suggest meningitis. She is well appearing and tolerating fluids without difficulty. Her lactic acid is within normal limits. She has been given fluids here in the emergency department. Long conversation with patient and mother in regards to fluid rehydration and fever control with alternating Tylenol and Motrin.  No open wounds or areas of cellulitis.    Final Clinical Impressions(s) / ED Diagnoses   Final diagnoses:  None    New  Prescriptions New Prescriptions   No medications on file     Dierdre ForthHannah Malkie Wille, PA-C 02/15/16 0745    Layla MawKristen N Ward, DO 02/15/16 [id number]

## 2016-02-15 NOTE — Discharge Instructions (Signed)
1. Medications: usual home medications 2. Treatment: rest, drink plenty of fluids, alternate tylenol and ibuprofen for fever control 3. Follow Up: Please followup with your primary doctor in 1 days for discussion of your diagnoses and further evaluation after today's visit; if you do not have a primary care doctor use the resource guide provided to find one; Please return to the ER for her worsening symptoms, progressing dehydration, neck pain, headaches or other concerns.

## 2016-03-16 ENCOUNTER — Encounter: Payer: Self-pay | Admitting: Hematology

## 2016-03-16 ENCOUNTER — Telehealth: Payer: Self-pay | Admitting: Hematology

## 2016-03-16 NOTE — Telephone Encounter (Signed)
Pt confirmed appt., completed intake, mailed pt letter, fax referring provider date/time

## 2016-04-07 ENCOUNTER — Ambulatory Visit (HOSPITAL_BASED_OUTPATIENT_CLINIC_OR_DEPARTMENT_OTHER): Payer: 59

## 2016-04-07 ENCOUNTER — Ambulatory Visit (HOSPITAL_BASED_OUTPATIENT_CLINIC_OR_DEPARTMENT_OTHER): Payer: 59 | Admitting: Hematology

## 2016-04-07 ENCOUNTER — Encounter: Payer: Self-pay | Admitting: Hematology

## 2016-04-07 ENCOUNTER — Telehealth: Payer: Self-pay | Admitting: Hematology

## 2016-04-07 VITALS — BP 123/68 | HR 78 | Temp 98.6°F | Resp 19 | Wt 231.3 lb

## 2016-04-07 DIAGNOSIS — D696 Thrombocytopenia, unspecified: Secondary | ICD-10-CM

## 2016-04-07 DIAGNOSIS — D509 Iron deficiency anemia, unspecified: Secondary | ICD-10-CM

## 2016-04-07 DIAGNOSIS — Z806 Family history of leukemia: Secondary | ICD-10-CM | POA: Diagnosis not present

## 2016-04-07 LAB — CHCC SMEAR

## 2016-04-07 LAB — COMPREHENSIVE METABOLIC PANEL
ALBUMIN: 3.7 g/dL (ref 3.5–5.0)
ALK PHOS: 69 U/L (ref 40–150)
ALT: 11 U/L (ref 0–55)
AST: 11 U/L (ref 5–34)
Anion Gap: 9 mEq/L (ref 3–11)
BUN: 19.6 mg/dL (ref 7.0–26.0)
CHLORIDE: 106 meq/L (ref 98–109)
CO2: 25 meq/L (ref 22–29)
Calcium: 10.2 mg/dL (ref 8.4–10.4)
Creatinine: 0.9 mg/dL (ref 0.6–1.1)
EGFR: >90 mL/min/{1.73_m2} (ref >90)
GLUCOSE: 96 mg/dL (ref 70–140)
POTASSIUM: 3.6 meq/L (ref 3.5–5.1)
SODIUM: 140 meq/L (ref 136–145)
Total Bilirubin: <0.22 mg/dL (ref 0.20–1.20)
Total Protein: 7.9 g/dL (ref 6.4–8.3)

## 2016-04-07 LAB — CBC & DIFF AND RETIC
BASO%: 0.1 % (ref 0.0–2.0)
BASOS ABS: 0 10*3/uL (ref 0.0–0.1)
EOS%: 0.7 % (ref 0.0–7.0)
Eosinophils Absolute: 0.1 10*3/uL (ref 0.0–0.5)
HEMATOCRIT: 37.6 % (ref 34.8–46.6)
HGB: 12 g/dL (ref 11.6–15.9)
IMMATURE RETIC FRACT: 6.9 % (ref 1.60–10.00)
LYMPH#: 2.6 10*3/uL (ref 0.9–3.3)
LYMPH%: 34.9 % (ref 14.0–49.7)
MCH: 22.4 pg — ABNORMAL LOW (ref 25.1–34.0)
MCHC: 31.9 g/dL (ref 31.5–36.0)
MCV: 70.3 fL — AB (ref 79.5–101.0)
MONO#: 0.4 10*3/uL (ref 0.1–0.9)
MONO%: 5.8 % (ref 0.0–14.0)
NEUT#: 4.4 10*3/uL (ref 1.5–6.5)
NEUT%: 58.5 % (ref 38.4–76.8)
PLATELETS: 353 10*3/uL (ref 145–400)
RBC: 5.35 10*6/uL (ref 3.70–5.45)
RDW: 17 % — ABNORMAL HIGH (ref 11.2–14.5)
RETIC CT ABS: 57.78 10*3/uL (ref 33.70–90.70)
Retic %: 1.08 % (ref 0.70–2.10)
WBC: 7.5 10*3/uL (ref 3.9–10.3)

## 2016-04-07 LAB — LACTATE DEHYDROGENASE: LDH: 156 U/L (ref 125–245)

## 2016-04-07 NOTE — Progress Notes (Signed)
HEMATOLOGY/ONCOLOGY CONSULTATION NOTE  Date of Service: 04/07/2016  Patient Care Team: Dorothyann Peng, MD as PCP - General (Internal Medicine)  CHIEF COMPLAINTS/PURPOSE OF CONSULTATION:  Thrombocytosis  HISTORY OF PRESENTING ILLNESS:   Rebecca Nicholson is a wonderful 20 y.o. female who has been referred to [place] by Dr .[name], MD  for evaluation and management of thrombocytosis.  Patient has a history of asthma, obesity, vitamin D deficiency, prediabetes and on routine labs on 02/24/2016 with her primary care physician was noted to have thrombocytosis with platelet counts of 450k. She was also noted and mild anemia with a hemoglobin of 11.4 with an MCV of 70 and normal WBC count 9.1k. Iron profile suggested some mild iron deficiency. Patient notes that she previously had heavy periods but has been using that for shots and now does not have any significant menstrual bleeding. No other evidence of bleeding. No history of venous or arterial thrombosis.  She was in the emergency room recently on 02/15/2016 with high-grade fevers body aches and nausea vomiting with dehydration thought to be related to viral illness.  Labs done today show no anemia with a hemoglobin of 12 but with persistent microcytosis and relative polycythemia. Her platelet counts are normal at 353k. Normal WBC count at 7.5k.  No fevers no chills or night sweats currently he's had no abdominal pain.    MEDICAL HISTORY:  Past Medical History:  Diagnosis Date  . Asthma    as child  . Hidradenitis suppurativa of right axilla   . Medical history non-contributory   Obesity Vitamin D deficiency Prediabetes  SURGICAL HISTORY: Past Surgical History:  Procedure Laterality Date  . HYDRADENITIS EXCISION Right 06/10/2014   Procedure: EXCISION OF RIGHT AXILLARY HIDRADENITIS ;  Surgeon: Manus Rudd, MD;  [facility];  Service: General;  Laterality: Right;  . TONSILLECTOMY       SOCIAL HISTORY: Social History   Social History  . Marital status: Single    Spouse name: N/A  . Number of children: N/A  . Years of education: N/A   Occupational History  . Not on file.   Social History Main Topics  . Smoking status: Never Smoker  . Smokeless tobacco: Never Used  . Alcohol use No  . Drug use: No  . Sexual activity: Yes    Birth control/ protection: Injection   Other Topics Concern  . Not on file   Social History Narrative  . No narrative on file    FAMILY HISTORY: Family History  Problem Relation Age of Onset  . Hypertension Mother   . Diabetes Mother   Father had some Leukemia , sickle cell trait. Brother rind deficiency anemia  ALLERGIES:  has No Known Allergies.  MEDICATIONS:  Current Outpatient Prescriptions  Medication Sig Dispense Refill  . albuterol (PROVENTIL HFA;VENTOLIN HFA) 108 (90 BASE) MCG/ACT inhaler Inhale 2 puffs into the lungs every 6 (six) hours as needed for wheezing.    . Aspirin-Acetaminophen-Caffeine (GOODYS EXTRA STRENGTH) 520-260-32.5 MG PACK Take 1 Package by mouth daily as needed (pain).    . medroxyPROGESTERone (DEPO-PROVERA) 150 MG/ML injection Inject 150 mg into the muscle every 3 (three) months.     No current facility-administered medications for this visit.     REVIEW OF SYSTEMS:    10 Point review of Systems was done is negative except as noted above.  PHYSICAL EXAMINATION: ECOG PERFORMANCE STATUS: 0 - Asymptomatic  . Vitals:   04/07/16 1101  BP: 123/68  Pulse:  78  Resp: 19  Temp: 98.6 F (37 C)   Filed Weights   04/07/16 1101  Weight: 231 lb 4.8 oz (104.9 kg)   .Body mass index is 36.23 kg/m.  GENERAL:alert, in no acute distress and comfortable SKIN: skin color, texture, turgor are normal, no rashes or significant lesions EYES: normal, conjunctiva are pink and non-injected, sclera clear OROPHARYNX:no exudate, no erythema and lips, buccal mucosa, and tongue normal  NECK: supple, no JVD,  thyroid normal size, non-tender, without nodularity LYMPH:  no palpable lymphadenopathy in the cervical, axillary or inguinal LUNGS: clear to auscultation with normal respiratory effort HEART: regular rate & rhythm,  no murmurs and no lower extremity edema ABDOMEN: abdomen soft, non-tender, normoactive bowel sounds , No palpable hepatosplenomegaly. Patient notes she feels well overall and has no acute new concerns Musculoskeletal: no cyanosis of digits and no clubbing  PSYCH: alert & oriented x 3 with fluent speech NEURO: no focal motor/sensory deficits  LABORATORY DATA:  I have reviewed the data as listed  . CBC Latest Ref Rng & Units 04/07/2016 02/14/2016 02/03/2015  WBC 3.9 - 10.3 10e3/uL 7.5 8.1 8.2  Hemoglobin 11.6 - 15.9 g/dL 14.712.0 82.912.0 11.6(L)  Hematocrit 34.8 - 46.6 % 37.6 38.1 37.3  Platelets 145 - 400 10e3/uL 353 343 350    . CMP Latest Ref Rng & Units 04/07/2016 02/14/2016 02/03/2015  Glucose 70 - 140 mg/dl 96 562(Z137(H) 308(M110(H)  BUN 7.0 - 26.0 mg/dL 57.$[id number]$$[id number]$19.6 9 13   Creatinine 0.6 - 1.1 mg/dL 0.9 6.070.75 3.710.68  Sodium 062136 - 145 mEq/L 140 133(L) 138  Potassium 3.5 - 5.1 mEq/L 3.6 3.4(L) 3.4(L)  Chloride 101 - 111 mmol/L - 103 104  CO2 22 - 29 mEq/L 25 21(L) 25  Calcium 8.4 - 10.4 mg/dL 69.410.2 9.5 9.8  Total Protein 6.4 - 8.3 g/dL 7.9 7.1 7.2  Total Bilirubin 0.20 - 1.20 mg/dL <8.54<0.22 6.2(V0.2(L) 0.3  Alkaline Phos 40 - 150 U/L 69 52 57  AST 5 - 34 U/L 11 17 15   ALT 0 - 55 U/L 11 16 15    . Lab Results  Component Value Date   IRON 19 (L) 04/07/2016   TIBC 313 04/07/2016   IRONPCTSAT 6 (L) 04/07/2016   (Iron and TIBC)  Lab Results  Component Value Date   FERRITIN 39 04/07/2016     RADIOGRAPHIC STUDIES:   I have personally reviewed the radiological images as listed and agreed with the findings in the report. No results found.  ASSESSMENT & PLAN:   20 year old African-American female with  #1 Thrombocytosis. This is now resolved. Could be related to iron deficiency anemia causing  reactive thrombocytosis. Could have been associated with intercurrent illness or infection. Given her young age the possibility of myeloproliferative neoplasm such as essential thrombocytosis would be unlikely. Also her thrombocytosis has not resolved. She has no evidence of a myeloproliferative neoplasm. No hepatosplenomegaly. Plan -No additional workup for thrombocytosis is recommended at this time. -Would replace her iron deficiency appropriately orally . -Low suspicion for ET/MPN  #2 RBC microcytosis without significant anemia . Patient has mild iron deficiency with a ferritin of 39 and iron saturation of 6% . Iron deficiency does not explain her degree of microcytosis . She had a hemoglobin electrophoresis in May 2017 that showed normal adult hemoglobin . 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 Plan -Would recommend replacement of iron with iron polysaccharide 150 mg by mouth daily to ferritin of more  than 50 and iron saturation of more than 20%  -I suspect that the patient has always been microcytic and this is likely related to alpha thalassemia trait . Could get alpha thalassemia gene deletion studies if desire though this often does not pick up all the variants.  Continue follow-up with primary care physician .we will inform the patient about her lab results . Follow-up on an as-needed basis if any new questions or concerns arise .  All of the patients questions were answered with apparent satisfaction. The patient knows to call the clinic with any problems, questions or concerns.  I spent 45 minutes counseling the patient face to face. The total time spent in the appointment was 60 minutes and more than 50% was on counseling and direct patient cares.    Wyvonnia LoraGautam Houston Zapien MD MS AAHIVMS [facility] Hematology/Oncology Physician [facility]  (Office):       [phone] (Work cell):  [phone] (Fax):            [phone]  04/07/2016 11:13 AM

## 2016-04-07 NOTE — Telephone Encounter (Signed)
Appointments scheduled per 11/9 LOS. Patient given AVS report and calendars with future scheduled appointments.    Patient requested to have lab addon scheduled for later in the afternoon per her work schedule. [facility] 4 PM today 11/9.

## 2016-04-08 LAB — SEDIMENTATION RATE: Sedimentation Rate-Westergren: 40 mm/hr — ABNORMAL HIGH (ref 0–32)

## 2016-04-08 LAB — IRON AND TIBC
%SAT: 6 % — ABNORMAL LOW (ref 21–57)
Iron: 19 ug/dL — ABNORMAL LOW (ref 41–142)
TIBC: 313 ug/dL (ref 236–444)
UIBC: 294 ug/dL (ref 120–384)

## 2016-04-08 LAB — FERRITIN: FERRITIN: 39 ng/mL (ref 9–269)

## 2016-04-11 ENCOUNTER — Telehealth: Payer: Self-pay | Admitting: *Deleted

## 2016-04-11 NOTE — Telephone Encounter (Signed)
Informed patient and she verbalized understanding.  

## 2016-04-11 NOTE — Telephone Encounter (Signed)
-----   Message from Johney MaineGautam Kishore Kale, MD sent at 04/10/2016 11:41 PM EST ----- Regarding: Lab results [name], Please let the patient know that her platelet counts have normalized and we do not suspect a bone marrow disorder. Her red blood cell size being small appears to be a combination of mild iron deficiency and likely alpha thalassemia trait. Would recommend oral iron replacement with iron polysaccharide 150 mg by mouth daily and further follow-up with primary care physician.thanks  GK

## 2016-06-27 DIAGNOSIS — Z01419 Encounter for gynecological examination (general) (routine) without abnormal findings: Secondary | ICD-10-CM | POA: Diagnosis not present

## 2016-07-07 DIAGNOSIS — M65839 Other synovitis and tenosynovitis, unspecified forearm: Secondary | ICD-10-CM | POA: Diagnosis not present

## 2016-08-25 DIAGNOSIS — N390 Urinary tract infection, site not specified: Secondary | ICD-10-CM | POA: Diagnosis not present

## 2016-09-08 DIAGNOSIS — R197 Diarrhea, unspecified: Secondary | ICD-10-CM | POA: Diagnosis not present

## 2016-09-08 DIAGNOSIS — R3121 Asymptomatic microscopic hematuria: Secondary | ICD-10-CM | POA: Diagnosis not present

## 2016-09-08 DIAGNOSIS — J309 Allergic rhinitis, unspecified: Secondary | ICD-10-CM | POA: Diagnosis not present

## 2016-11-06 DIAGNOSIS — J029 Acute pharyngitis, unspecified: Secondary | ICD-10-CM | POA: Diagnosis not present

## 2016-11-06 DIAGNOSIS — J069 Acute upper respiratory infection, unspecified: Secondary | ICD-10-CM | POA: Diagnosis not present

## 2016-11-09 DIAGNOSIS — J301 Allergic rhinitis due to pollen: Secondary | ICD-10-CM | POA: Diagnosis not present

## 2016-11-15 DIAGNOSIS — Z1389 Encounter for screening for other disorder: Secondary | ICD-10-CM | POA: Diagnosis not present

## 2016-11-15 DIAGNOSIS — J309 Allergic rhinitis, unspecified: Secondary | ICD-10-CM | POA: Diagnosis not present

## 2016-11-15 DIAGNOSIS — J45909 Unspecified asthma, uncomplicated: Secondary | ICD-10-CM | POA: Diagnosis not present

## 2016-12-27 DIAGNOSIS — J45909 Unspecified asthma, uncomplicated: Secondary | ICD-10-CM | POA: Diagnosis not present

## 2016-12-27 DIAGNOSIS — R197 Diarrhea, unspecified: Secondary | ICD-10-CM | POA: Diagnosis not present

## 2017-02-06 DIAGNOSIS — Z0000 Encounter for general adult medical examination without abnormal findings: Secondary | ICD-10-CM | POA: Diagnosis not present

## 2017-03-20 DIAGNOSIS — Z23 Encounter for immunization: Secondary | ICD-10-CM | POA: Diagnosis not present

## 2017-03-23 DIAGNOSIS — D649 Anemia, unspecified: Secondary | ICD-10-CM | POA: Diagnosis not present

## 2017-03-23 DIAGNOSIS — Z0000 Encounter for general adult medical examination without abnormal findings: Secondary | ICD-10-CM | POA: Diagnosis not present

## 2017-06-29 DIAGNOSIS — Z124 Encounter for screening for malignant neoplasm of cervix: Secondary | ICD-10-CM | POA: Diagnosis not present

## 2017-06-29 DIAGNOSIS — R87610 Atypical squamous cells of undetermined significance on cytologic smear of cervix (ASC-US): Secondary | ICD-10-CM | POA: Diagnosis not present

## 2017-06-29 DIAGNOSIS — Z6841 Body Mass Index (BMI) 40.0 and over, adult: Secondary | ICD-10-CM | POA: Diagnosis not present

## 2017-06-29 DIAGNOSIS — Z01419 Encounter for gynecological examination (general) (routine) without abnormal findings: Secondary | ICD-10-CM | POA: Diagnosis not present

## 2017-11-08 DIAGNOSIS — Z3009 Encounter for other general counseling and advice on contraception: Secondary | ICD-10-CM | POA: Diagnosis not present

## 2017-12-14 DIAGNOSIS — R197 Diarrhea, unspecified: Secondary | ICD-10-CM | POA: Diagnosis not present

## 2017-12-14 DIAGNOSIS — R109 Unspecified abdominal pain: Secondary | ICD-10-CM | POA: Diagnosis not present

## 2017-12-19 ENCOUNTER — Encounter: Payer: Self-pay | Admitting: Gastroenterology

## 2018-02-09 ENCOUNTER — Encounter: Payer: Self-pay | Admitting: *Deleted

## 2018-02-14 ENCOUNTER — Encounter: Payer: Self-pay | Admitting: Gastroenterology

## 2018-02-14 ENCOUNTER — Ambulatory Visit: Payer: 59 | Admitting: Gastroenterology

## 2018-02-14 ENCOUNTER — Encounter (INDEPENDENT_AMBULATORY_CARE_PROVIDER_SITE_OTHER): Payer: Self-pay

## 2018-02-14 VITALS — BP 120/80 | HR 60 | Ht 67.0 in | Wt 234.0 lb

## 2018-02-14 DIAGNOSIS — Z91018 Allergy to other foods: Secondary | ICD-10-CM | POA: Diagnosis not present

## 2018-02-14 DIAGNOSIS — K5909 Other constipation: Secondary | ICD-10-CM | POA: Diagnosis not present

## 2018-02-14 DIAGNOSIS — K588 Other irritable bowel syndrome: Secondary | ICD-10-CM

## 2018-02-14 NOTE — Progress Notes (Signed)
Rebecca Nicholson    [id number]    [birth year]  Primary Care Physician:Sanders, Melina Schools, MD  Referring Physician: Dorothyann Peng, MD 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  Patient complains of generalized abdominal pain, worse after a meal usually associated with change in bowel habits with diarrhea followed by constipation.  She has had these symptoms for past 2 to 3 years but since she has been abstaining from red meat in the past 2 months she has not had any further episodes of irregular bowel habits or abdominal pain.  Patient thinks her symptoms started after she had an episode of food poisoning in 2014 and since then whenever she consumes red meat she has abdominal pain and diarrhea. Denies any nausea, vomiting, loss of appetite or weight loss.  No melena or blood in stool. Maternal grandmother had breast cancer and colon cancer.  No family history of IBD or celiac disease    Outpatient Encounter Medications as of 02/14/2018  Medication Sig  . albuterol (PROVENTIL HFA;VENTOLIN HFA) 108 (90 BASE) MCG/ACT inhaler Inhale 2 puffs into the lungs every 6 (six) hours as needed for wheezing.  . medroxyPROGESTERone (DEPO-PROVERA) 150 MG/ML injection Inject 150 mg into the muscle every 3 (three) months.   No facility-administered encounter medications on file as of 02/14/2018.     Allergies as of 02/14/2018  . (No Known Allergies)    Past Medical History:  Diagnosis Date  . Asthma    as child  . Hidradenitis suppurativa of right axilla   . Medical history non-contributory     Past Surgical History:  Procedure Laterality Date  . HYDRADENITIS EXCISION Right 06/10/2014   Procedure: EXCISION OF RIGHT AXILLARY HIDRADENITIS ;  Surgeon: Manus Rudd, MD;  Location:  [facility];  Service:  General;  Laterality: Right;  . TONSILLECTOMY      Family History  Problem Relation Age of Onset  . Hypertension Mother   . Diabetes Mother   . Diabetes Maternal Aunt   . Colon cancer Maternal Grandmother   . Breast cancer Maternal Grandmother   . Diabetes Maternal Grandmother     Social History   Socioeconomic History  . Marital status: Single    Spouse name: Not on file  . Number of children: Not on file  . Years of education: Not on file  . Highest education level: Not on file  Occupational History  . Not on file  Social Needs  . Financial resource strain: Not on file  . Food insecurity:    Worry: Not on file    Inability: Not on file  . Transportation needs:    Medical: Not on file    Non-medical: Not on file  Tobacco Use  . Smoking status: Never Smoker  . Smokeless tobacco: Never Used  Substance and Sexual Activity  . Alcohol use: No  . Drug use: No  . Sexual activity: Yes    Birth control/protection: Injection  Lifestyle  . Physical activity:    Days per week: Not on file    Minutes per session: Not on file  . Stress: Not on file  Relationships  . Social connections:    Talks on phone: Not on file    Gets together: Not on file    Attends religious service: Not on file  Active member of club or organization: Not on file    Attends meetings of clubs or organizations: Not on file    Relationship status: Not on file  . Intimate partner violence:    Fear of current or ex partner: Not on file    Emotionally abused: Not on file    Physically abused: Not on file    Forced sexual activity: Not on file  Other Topics Concern  . Not on file  Social History Narrative  . Not on file      Review of systems: Review of Systems  Constitutional: Negative for fever and chills.  HENT: Positive for allergy, sinus trouble.   Eyes: Negative for blurred vision.  Respiratory: Negative for cough, shortness of breath and wheezing.   Cardiovascular: Negative for  chest pain and palpitations.  Gastrointestinal: as per HPI Genitourinary: Negative for dysuria, urgency, frequency and hematuria.  Musculoskeletal: Negative for myalgias, back pain and joint pain.  Skin: Negative for itching and rash.  Neurological: Negative for dizziness, tremors, focal weakness, seizures and loss of consciousness.  Endo/Heme/Allergies: Positive for seasonal allergies.  Psychiatric/Behavioral: Negative for depression, suicidal ideas and hallucinations.  All other systems reviewed and are negative.   Physical Exam: Vitals:   02/14/18 0937  BP: 120/80  Pulse: 60   Body mass index is 36.65 kg/m. Gen:      No acute distress HEENT:  EOMI, sclera anicteric Neck:     No masses; no thyromegaly Lungs:    Clear to auscultation bilaterally; normal respiratory effort CV:         Regular rate and rhythm; no murmurs Abd:      + bowel sounds; soft, non-tender; no palpable masses, no distension Ext:    No edema; adequate peripheral perfusion Skin:      Warm and dry; no rash Neuro: alert and oriented x 3 Psych: normal mood and affect  Data Reviewed:  Reviewed labs, radiology imaging, old records and pertinent past GI work up   Assessment and Plan/Recommendations:  22 year old African-American female with history of obesity, asthma here for new patient visit with complaints of intermittent generalized abdominal pain and irregular bowel habits whenever she consumes red meat She has not had any episodes of generalized abdominal pain since she stopped eating red meat 2 months ago Will refer to allergy and immunology testing to exclude alpha gal syndrome Fiberchoice 1 to 2 tablets daily Increase dietary fiber and fluid intake to improve constipation IBgard 1 capsule up to 3 times daily as needed for IBS symptoms   K. Scherry RanVeena Nandigam , MD [phone]    CC: Dorothyann PengSanders, Robyn, MD

## 2018-02-14 NOTE — Patient Instructions (Addendum)
Take IBgard 1 capsule three times a day as needed  Take Fiberchoice 1-2 tablets daily  We will do a referral for allergy testing for you and contact you with that appointment when it becomes available   Follow up in 3 months  If you are age 23 or older, your body mass index should be between 23-30. Your Body mass index is 36.65 kg/m. If this is out of the aforementioned range listed, please consider follow up with your Primary Care Provider.  If you are age 23 or younger, your body mass index should be between 19-25. Your Body mass index is 36.65 kg/m. If this is out of the aformentioned range listed, please consider follow up with your Primary Care Provider.    Thank you for choosing [facility]  Philbert RiserKavitha Nandigam,MD

## 2018-03-14 ENCOUNTER — Encounter: Payer: Self-pay | Admitting: Allergy

## 2018-03-14 ENCOUNTER — Ambulatory Visit (INDEPENDENT_AMBULATORY_CARE_PROVIDER_SITE_OTHER): Payer: 59 | Admitting: Allergy

## 2018-03-14 VITALS — BP 122/76 | HR 78 | Temp 98.7°F | Resp 18 | Ht 67.0 in | Wt 238.4 lb

## 2018-03-14 DIAGNOSIS — T781XXD Other adverse food reactions, not elsewhere classified, subsequent encounter: Secondary | ICD-10-CM

## 2018-03-14 DIAGNOSIS — J310 Chronic rhinitis: Secondary | ICD-10-CM | POA: Diagnosis not present

## 2018-03-14 DIAGNOSIS — J4540 Moderate persistent asthma, uncomplicated: Secondary | ICD-10-CM

## 2018-03-14 DIAGNOSIS — T781XXA Other adverse food reactions, not elsewhere classified, initial encounter: Secondary | ICD-10-CM | POA: Insufficient documentation

## 2018-03-14 HISTORY — DX: Chronic rhinitis: J31.0

## 2018-03-14 HISTORY — DX: Moderate persistent asthma, uncomplicated: J45.40

## 2018-03-14 NOTE — Patient Instructions (Addendum)
Adverse food reaction Abdominal pain with nausea, diarrhea for the past 1 year after eating beef and pork. Improves after taking Pepto Bismol. No history of tick bites. Patient was evaluated by GI as well.  Today's skin testing was negative to beef and pork. Food allergen skin testing has excellent negative predictive value however there is still a 5% chance that the allergy exists. Therefore, we will investigate further with bloodwork as below including alpha gal.  Until the food allergy has been definitively ruled out, the patient is to continue meticulous avoidance of red meat including beef and pork.  Get bloodwork as below. Will make additional recommendations based on results.  Lab Orders     Allergens w/Total IgE Area 2     Alpha-Gal Panel  Chronic rhinitis Rhinitis symptoms for the past 20 years mainly during the summer only.  Using Claritin as needed with good benefit.  Today's skin testing was negative to environmental allergens. Get bloodwork as below.  May use over the counter antihistamines such as Zyrtec (cetirizine), Claritin (loratadine), Allegra (fexofenadine), or Xyzal (levocetirizine) daily as needed.  Moderate persistent asthma without complication Diagnosed with asthma 19 years ago. Currently on Symbicort 80 2 puffs twice a day. Today's spirometry showed mild restriction.  Continue Symbicort 80 2 puffs twice a day and rinse mouth afterwards.  Demonstrated proper use.  May use albuterol rescue inhaler 2 puffs or nebulizer every 4 to 6 hours as needed for shortness of breath, chest tightness, coughing, and wheezing. May use albuterol rescue inhaler 2 puffs 5 to 15 minutes prior to strenuous physical activities.  Monitor symptoms.  Return in about 6 months (around 09/13/2018).

## 2018-03-14 NOTE — Assessment & Plan Note (Addendum)
Abdominal pain with nausea, diarrhea for the past 1 year after eating beef and pork. Improves after taking Pepto Bismol. No history of tick bites. Patient was evaluated by GI as well.  Today's skin testing was negative to beef and pork. Food allergen skin testing has excellent negative predictive value however there is still a 5% chance that the allergy exists. Therefore, we will investigate further with bloodwork as below including alpha gal.  Until the food allergy has been definitively ruled out, the patient is to continue meticulous avoidance of red meat including beef and pork.  Get bloodwork as below. Will make additional recommendations based on results.  Lab Orders     Allergens w/Total IgE Area 2     Alpha-Gal Panel

## 2018-03-14 NOTE — Progress Notes (Signed)
New Patient Note  RE: Rebecca Nicholson MRN: [id number] DOB: [birth year] Date of Office Visit: 03/14/2018  Referring provider: Dr. Gerrie Nordmann (GI) Primary care provider: Dorothyann Peng, MD  Chief Complaint: Diarrhea  History of Present Illness: I had the pleasure of seeing Rebecca Nicholson for initial evaluation at the [facility] on 03/14/2018. She is a 22 y.o. female, who is referred here by Dorothyann Peng, MD for the evaluation of alpha-gal. She is accompanied today by her sister who provided/contributed to the history.   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 The reaction started to occur about 1 year ago. Patient noticed changes in her bowels since she had a bad case of food poisoning a few years ago. The issue with the beef and pork can happen within minutes of ingestion or the following day. Mainly experiences abdominal pain, nausea, diarrhea. Symptoms can last for a few hours after pepto bismol which helps. Denies any other associated symptoms. Previously tolerated beef and pork with no issues.  She stopped eating red meat the past 2 months and doing better.  She was not evaluated in ED. Patient was seen by GI but no further testing was done. No known tick bite exposure per patient report.   Past work up includes: none Dietary History: patient has been eating other foods including milk, eggs, peanut, treenuts, sesame, shellfish, seafood, soy, wheat, chicken, fruits and vegetables.  She reports reading labels and avoiding beef, pork in diet completely.  Asthma:  She reports symptoms of chest tightness, shortness of breath, coughing, wheezing for 19 years. Current medications include Symbicort 2 puffs QD x 1 yr, albuterol prn which help. She reports not using aerochamber with asthma inhalers. She tried the following inhalers: none. Main asthma triggers are exercise. In the last month, frequency of asthma symptoms: 0x/week. Frequency of nocturnal symptoms:  0x/month. Frequency of SABA use: 0x/week. Interference with physical activity: yes. Sleep is undisturbed. In the last 12 months, emergency room visits/urgent care visits/doctor office visits or hospitalizations due to asthma: no. In the last 12 months, oral steroids courses: no Lifetime history of hospitalization for asthma: no. Prior intubations: no. History of pneumonia: no. She was not evaluated by allergist/pulmonologist in the past. Smoking exposure: no. Up to date with flu vaccine: not yet.  Rhinitis: She reports symptoms of sneezing. Symptoms have been going on for 20 years. The symptoms are present during the summer only. Anosmia: no. Headache: no.She has used claritin with fair improvement in symptoms. Sinus infections: no. Previous work up includes: no.  Assessment and Plan: Rebecca Nicholson is a 22 y.o. female with: Adverse food reaction Abdominal pain with nausea, diarrhea for the past 1 year after eating beef and pork. Improves after taking Pepto Bismol. No history of tick bites. Patient was evaluated by GI as well.  Today's skin testing was negative to beef and pork. Food allergen skin testing has excellent negative predictive value however there is still a 5% chance that the allergy exists. Therefore, we will investigate further with bloodwork as below including alpha gal.  Until the food allergy has been definitively ruled out, the patient is to continue meticulous avoidance of red meat including beef and pork.  Get bloodwork as below. Will make additional recommendations based on results.  Lab Orders     Allergens w/Total IgE Area 2     Alpha-Gal Panel  Chronic rhinitis Rhinitis symptoms for the past 20 years mainly during the summer only.  Using Claritin  as needed with good benefit.  Today's skin testing was negative to environmental allergens. Get bloodwork as below.  May use over the counter antihistamines such as Zyrtec (cetirizine), Claritin (loratadine), Allegra (fexofenadine),  or Xyzal (levocetirizine) daily as needed.  Moderate persistent asthma without complication Diagnosed with asthma 19 years ago. Currently on Symbicort 80 2 puffs twice a day. Today's spirometry showed mild restriction.  Continue Symbicort 80 2 puffs twice a day and rinse mouth afterwards.  Demonstrated proper use.  May use albuterol rescue inhaler 2 puffs or nebulizer every 4 to 6 hours as needed for shortness of breath, chest tightness, coughing, and wheezing. May use albuterol rescue inhaler 2 puffs 5 to 15 minutes prior to strenuous physical activities.  Monitor symptoms.  Return in about 6 months (around 09/13/2018).  Other allergy screening: Asthma: yes Rhino conjunctivitis: yes Food allergy: yes Medication allergy: no Hymenoptera allergy: no Urticaria: no Eczema:no History of recurrent infections suggestive of immunodeficency: no  Diagnostics: Spirometry:  Tracings reviewed. Her effort: Good reproducible efforts. FVC: 2.85L FEV1: 2.34L, 75% predicted FEV1/FVC ratio: 82% Interpretation: spirometry showed mild restriction.  Please see scanned spirometry results for details.  Skin Testing: environmental, beef, pork and lamb. Negative test to: environmental allergens, beef, pork and lamb.  Results discussed with patient/family. Airborne Adult Perc - 03/14/18 0958    Time Antigen Placed  0954    Allergen Manufacturer  Waynette Buttery    Location  Back    Number of Test  59    Panel 1  Select    1. Control-Buffer 50% Glycerol  Negative    2. Control-Histamine 1 mg/ml  2+    3. Albumin saline  Negative    4. Bahia  Negative    5. French Southern Territories  Negative    6. Johnson  Negative    7. [place] Blue  Negative    8. Meadow Fescue  Negative    9. Perennial Rye  Negative    10. Sweet Vernal  Negative    11. Timothy  Negative    12. Cocklebur  Negative    13. Burweed Marshelder  Negative    14. Ragweed, short  Negative    15. Ragweed, Giant  Negative    16. Plantain,  English   Negative    17. Lamb's Quarters  Negative    18. Sheep Sorrell  Negative    19. Rough Pigweed  Negative    20. Marsh Elder, Rough  Negative    21. Mugwort, Common  Negative    22. Ash mix  Negative    23. Birch mix  Negative    24. Beech American  Negative    25. Box, Elder  Negative    26. Cedar, red  Negative    27. [place], Guinea[place]Bissau  Negative    28. Elm mix  Negative    29. Hickory mix  Negative    30. Maple mix  Negative    31. Oak, Guinea-Bissau mix  Negative    32. Pecan Pollen  Negative    33. Pine mix  Negative    34. Sycamore Eastern  Negative    35. Walnut, Black Pollen  Negative    36. Alternaria alternata  Negative    37. Cladosporium Herbarum  Negative    38. Aspergillus mix  Negative    39. Penicillium mix  Negative    40. Bipolaris sorokiniana (Helminthosporium)  Negative    41. Drechslera spicifera (Curvularia)  Negative    42. Mucor plumbeus  Negative    43. Fusarium moniliforme  Negative    44. Aureobasidium pullulans (pullulara)  Negative    45. Rhizopus oryzae  Negative    46. Botrytis cinera  Negative    47. Epicoccum nigrum  Negative    48. Phoma betae  Negative    49. Candida Albicans  Negative    50. Trichophyton mentagrophytes  Negative    51. Mite, D Farinae  5,000 AU/ml  Negative    52. Mite, D Pteronyssinus  5,000 AU/ml  Negative    53. Cat Hair 10,000 BAU/ml  Negative    54.  Dog Epithelia  Negative    55. Mixed Feathers  Negative    56. Horse Epithelia  Negative    57. Cockroach, German  Negative    58. Mouse  Negative    59. Tobacco Leaf  Negative     Food Adult Perc - 03/14/18 0900    Time Antigen Placed  1610    Allergen Manufacturer  Waynette Buttery    Location  Back    Number of allergen test  3    Control-Histamine 1 mg/ml  Omitted    37. Pork  Negative    40. Beef  Negative    41. Lamb  Negative       Past Medical History: Patient Active Problem List   Diagnosis Date Noted  . Adverse food reaction 03/14/2018  . Chronic rhinitis  03/14/2018  . Moderate persistent asthma without complication 03/14/2018   Past Medical History:  Diagnosis Date  . Asthma    as child  . Hidradenitis suppurativa of right axilla   . Medical history non-contributory    Past Surgical History: Past Surgical History:  Procedure Laterality Date  . DENTAL SURGERY    . HYDRADENITIS EXCISION Right 06/10/2014   Procedure: EXCISION OF RIGHT AXILLARY HIDRADENITIS ;  Surgeon: Manus Rudd, MD;  Location[facility];  Service: General;  Laterality: Right;  . TONSILLECTOMY     Medication List:  Current Outpatient Medications  Medication Sig Dispense Refill  . albuterol (PROVENTIL HFA;VENTOLIN HFA) 108 (90 BASE) MCG/ACT inhaler Inhale 2 puffs into the lungs every 6 (six) hours as needed for wheezing.    . budesonide-formoterol (SYMBICORT) 80-4.5 MCG/ACT inhaler Inhale 2 puffs into the lungs 2 (two) times daily.    . medroxyPROGESTERone (DEPO-PROVERA) 150 MG/ML injection Inject 150 mg into the muscle every 3 (three) months.     No current facility-administered medications for this visit.    Allergies: No Known Allergies Social History: Social History   Socioeconomic History  . Marital status: Single    Spouse name: Not on file  . Number of children: Not on file  . Years of education: Not on file  . Highest education level: Not on file  Occupational History  . Not on file  Social Needs  . Financial resource strain: Not on file  . Food insecurity:    Worry: Not on file    Inability: Not on file  . Transportation needs:    Medical: Not on file    Non-medical: Not on file  Tobacco Use  . Smoking status: Never Smoker  . Smokeless tobacco: Never Used  Substance and Sexual Activity  . Alcohol use: No  . Drug use: No  . Sexual activity: Yes    Birth control/protection: Injection  Lifestyle  . Physical activity:    Days per week: Not on file    Minutes per session: Not on file  .  Stress: Not on file  Relationships    . Social connections:    Talks on phone: Not on file    Gets together: Not on file    Attends religious service: Not on file    Active member of club or organization: Not on file    Attends meetings of clubs or organizations: Not on file    Relationship status: Not on file  Other Topics Concern  . Not on file  Social History Narrative  . Not on file   Lives in an apartment. Smoking: denies Occupation: Runner, broadcasting/film/video at [employer] History: Water Damage/mildew in the house: no Carpet in the family room: yes Carpet in the bedroom: yes Pet: no  Family History: Family History  Problem Relation Age of Onset  . Hypertension Mother   . Diabetes Mother   . Diabetes Maternal Aunt   . Colon cancer Maternal Grandmother   . Breast cancer Maternal Grandmother   . Diabetes Maternal Grandmother    Problem                               Relation Asthma                                   Sister  Eczema                                Sister  Food allergy                          No  Allergic rhino conjunctivitis     Sister   Review of Systems  Constitutional: Negative for appetite change, chills, fever and unexpected weight change.  HENT: Negative for congestion and rhinorrhea.   Eyes: Negative for itching.  Respiratory: Negative for cough, chest tightness, shortness of breath and wheezing.   Cardiovascular: Negative for chest pain.  Gastrointestinal: Positive for abdominal pain.  Genitourinary: Negative for difficulty urinating.  Skin: Negative for rash.  Allergic/Immunologic: Positive for food allergies. Negative for environmental allergies.  Neurological: Negative for headaches.   Objective: BP 122/76 (BP Location: Right Arm, Patient Position: Sitting, Cuff Size: Normal)   Pulse 78   Temp 98.7 F (37.1 C) (Oral)   Resp 18   Ht 5\' 7"  (1.702 m)   Wt 238 lb 6.4 oz (108.1 kg)   SpO2 99%   BMI 37.34 kg/m  Body mass index is 37.34 kg/m. Physical Exam  Constitutional:  She is oriented to person, place, and time. She appears well-developed and well-nourished.  HENT:  Head: Normocephalic and atraumatic.  Right Ear: External ear normal.  Left Ear: External ear normal.  Nose: Nose normal.  Mouth/Throat: Oropharynx is clear and moist.  Eyes: Conjunctivae and EOM are normal.  Neck: Neck supple.  Cardiovascular: Normal rate, regular rhythm and normal heart sounds. Exam reveals no gallop and no friction rub.  No murmur heard. Pulmonary/Chest: Effort normal and breath sounds normal. She has no wheezes. She has no rales.  Abdominal: Soft. Bowel sounds are normal. There is no tenderness.  Lymphadenopathy:    She has no cervical adenopathy.  Neurological: She is alert and oriented to person, place, and time.  Skin: Skin is warm. No rash noted.  Psychiatric: She has a normal mood and  affect. Her behavior is normal.  Nursing note and vitals reviewed.  The plan was reviewed with the patient/family, and all questions/concerned were addressed.  It was my pleasure to see Burnette today and participate in her care. Please feel free to contact me with any questions or concerns.  Sincerely,  Wyline Mood, DO [facility]

## 2018-03-14 NOTE — Assessment & Plan Note (Addendum)
Rhinitis symptoms for the past 20 years mainly during the summer only.  Using Claritin as needed with good benefit.  Today's skin testing was negative to environmental allergens. Get bloodwork as below.  May use over the counter antihistamines such as Zyrtec (cetirizine), Claritin (loratadine), Allegra (fexofenadine), or Xyzal (levocetirizine) daily as needed.

## 2018-03-14 NOTE — Assessment & Plan Note (Addendum)
Diagnosed with asthma 19 years ago. Currently on Symbicort 80 2 puffs twice a day. Today's spirometry showed mild restriction.  Continue Symbicort 80 2 puffs twice a day and rinse mouth afterwards.  Demonstrated proper use.  May use albuterol rescue inhaler 2 puffs or nebulizer every 4 to 6 hours as needed for shortness of breath, chest tightness, coughing, and wheezing. May use albuterol rescue inhaler 2 puffs 5 to 15 minutes prior to strenuous physical activities.  Monitor symptoms.

## 2018-03-19 ENCOUNTER — Encounter: Payer: Self-pay | Admitting: Allergy

## 2018-03-19 LAB — ALLERGENS W/TOTAL IGE AREA 2
Alternaria Alternata IgE: <0.1 kU/L
Bermuda Grass IgE: <0.1 kU/L
Cat Dander IgE: <0.1 kU/L
Cockroach, German IgE: <0.1 kU/L
Common Silver Birch IgE: <0.1 kU/L
Cottonwood IgE: <0.1 kU/L
D Farinae IgE: <0.1 kU/L
Dog Dander IgE: <0.1 kU/L
Elm, American IgE: <0.1 kU/L
IgE (Immunoglobulin E), Serum: 7 IU/mL (ref 6–495)
Oak, White IgE: <0.1 kU/L
Pecan, Hickory IgE: <0.1 kU/L
Penicillium Chrysogen IgE: <0.1 kU/L
Timothy Grass IgE: <0.1 kU/L

## 2018-03-19 LAB — ALPHA-GAL PANEL
Alpha Gal IgE*: <0.1 kU/L (ref <0.10)
BEEF CLASS INTERPRETATION: 0
Class Interpretation: 0
Class Interpretation: 0
Lamb/Mutton (Ovis spp) IgE: <0.1 kU/L (ref <0.35)
Pork (Sus spp) IgE: <0.1 kU/L (ref <0.35)

## 2018-04-16 DIAGNOSIS — Z3009 Encounter for other general counseling and advice on contraception: Secondary | ICD-10-CM | POA: Diagnosis not present

## 2018-05-09 ENCOUNTER — Encounter: Payer: Self-pay | Admitting: Internal Medicine

## 2018-05-09 ENCOUNTER — Ambulatory Visit: Payer: 59 | Admitting: Internal Medicine

## 2018-05-09 VITALS — BP 110/76 | HR 68 | Temp 98.3°F | Ht 66.5 in | Wt 237.0 lb

## 2018-05-09 DIAGNOSIS — Z0000 Encounter for general adult medical examination without abnormal findings: Secondary | ICD-10-CM

## 2018-05-09 DIAGNOSIS — Z23 Encounter for immunization: Secondary | ICD-10-CM

## 2018-05-09 LAB — LIPID PANEL
Chol/HDL Ratio: 3.8 ratio (ref 0.0–4.4)
Cholesterol, Total: 166 mg/dL (ref 100–199)
HDL: 44 mg/dL (ref >39)
LDL Calculated: 115 mg/dL — ABNORMAL HIGH (ref 0–99)
TRIGLYCERIDES: 36 mg/dL (ref 0–149)
VLDL Cholesterol Cal: 7 mg/dL (ref 5–40)

## 2018-05-09 LAB — POCT URINALYSIS DIPSTICK
Bilirubin, UA: NEGATIVE
Glucose, UA: NEGATIVE
Ketones, UA: NEGATIVE
Nitrite, UA: NEGATIVE
Protein, UA: NEGATIVE
Spec Grav, UA: 1.025 (ref 1.010–1.025)
UROBILINOGEN UA: 0.2 U/dL
pH, UA: 6 (ref 5.0–8.0)

## 2018-05-09 LAB — CBC
HEMATOCRIT: 35.3 % (ref 34.0–46.6)
Hemoglobin: 11.6 g/dL (ref 11.1–15.9)
MCH: 22.7 pg — ABNORMAL LOW (ref 26.6–33.0)
MCHC: 32.9 g/dL (ref 31.5–35.7)
MCV: 69 fL — AB (ref 79–97)
Platelets: 405 10*3/uL (ref 150–450)
RBC: 5.1 x10E6/uL (ref 3.77–5.28)
RDW: 15.8 % — ABNORMAL HIGH (ref 12.3–15.4)
WBC: 6.5 10*3/uL (ref 3.4–10.8)

## 2018-05-09 LAB — CMP14+EGFR
A/G RATIO: 1.6 (ref 1.2–2.2)
ALT: 12 IU/L (ref 0–32)
AST: 12 IU/L (ref 0–40)
Albumin: 4.4 g/dL (ref 3.5–5.5)
Alkaline Phosphatase: 61 IU/L (ref 39–117)
BUN/Creatinine Ratio: 22 (ref 9–23)
BUN: 15 mg/dL (ref 6–20)
Bilirubin Total: 0.3 mg/dL (ref 0.0–1.2)
CALCIUM: 10 mg/dL (ref 8.7–10.2)
CHLORIDE: 104 mmol/L (ref 96–106)
CO2: 21 mmol/L (ref 20–29)
CREATININE: 0.68 mg/dL (ref 0.57–1.00)
GFR calc Af Amer: 143 mL/min/{1.73_m2} (ref >59)
GFR, EST NON AFRICAN AMERICAN: 124 mL/min/{1.73_m2} (ref >59)
GLUCOSE: 82 mg/dL (ref 65–99)
Globulin, Total: 2.8 g/dL (ref 1.5–4.5)
Potassium: 4.3 mmol/L (ref 3.5–5.2)
SODIUM: 140 mmol/L (ref 134–144)
Total Protein: 7.2 g/dL (ref 6.0–8.5)

## 2018-05-09 LAB — HEMOGLOBIN A1C
Est. average glucose Bld gHb Est-mCnc: 123 mg/dL
Hgb A1c MFr Bld: 5.9 % — ABNORMAL HIGH (ref 4.8–5.6)

## 2018-05-09 NOTE — Patient Instructions (Signed)

## 2018-05-10 NOTE — Progress Notes (Signed)
Here are your lab results:  Your liver and kidney function are normal.  Your blood count is low normal. I will add on iron levels. Your LDL, bad cholesterol, is 115. Ideally, this should be less than 100. Please avoid fried foods and incorporate more exercise into your daily routine. Your a1c is 5.9, this is in prediabetes range.   Happy holidays to you and your family!  Sincerely,    Sederick Jacobsen N. Allyne GeeSanders, MD

## 2018-05-12 ENCOUNTER — Encounter: Payer: Self-pay | Admitting: Internal Medicine

## 2018-05-12 NOTE — Progress Notes (Signed)
Subjective:     Patient ID: Rebecca Nicholson , female    DOB: [birth year] , 22 y.o.   MRN: [id number]   Chief Complaint  Patient presents with  . Annual Exam    HPI  She is here today for a full physical examination. She is followed by Earnstine Regal, PA at [facility] for her pap smears. She reports that she is seen yearly. She is not currently sexually active.     Past Medical History:  Diagnosis Date  . Asthma    as child  . Hidradenitis suppurativa of right axilla   . Medical history non-contributory      Family History  Problem Relation Age of Onset  . Hypertension Mother   . Diabetes Mother   . Diabetes Maternal Aunt   . Colon cancer Maternal Grandmother   . Breast cancer Maternal Grandmother   . Diabetes Maternal Grandmother   . Cancer Father      Current Outpatient Medications:  .  albuterol (PROVENTIL HFA;VENTOLIN HFA) 108 (90 BASE) MCG/ACT inhaler, Inhale 2 puffs into the lungs every 6 (six) hours as needed for wheezing., Disp: , Rfl:  .  budesonide-formoterol (SYMBICORT) 80-4.5 MCG/ACT inhaler, Inhale 2 puffs into the lungs 2 (two) times daily., Disp: , Rfl:  .  medroxyPROGESTERone (DEPO-PROVERA) 150 MG/ML injection, Inject 150 mg into the muscle every 3 (three) months., Disp: , Rfl:    No Known Allergies   Review of Systems  Constitutional: Negative.   HENT: Negative.   Eyes: Negative.   Respiratory: Negative.   Cardiovascular: Negative.   Gastrointestinal: Negative.   Endocrine: Negative.   Genitourinary: Negative.   Musculoskeletal: Negative.   Skin: Negative.   Allergic/Immunologic: Negative.   Neurological: Negative.   Hematological: Negative.   Psychiatric/Behavioral: Negative.      Today's Vitals   05/09/18 0943  BP: 110/76  Pulse: 68  Temp: 98.3 F (36.8 C)  TempSrc: Oral  Weight: 237 lb (107.5 kg)  Height: 5' 6.5" (1.689 m)   Body mass index is 37.68 kg/m.   Objective:  Physical Exam Vitals signs and nursing  note reviewed.  Constitutional:      Appearance: Normal appearance. She is obese.  HENT:     Head: Normocephalic and atraumatic.     Right Ear: Tympanic membrane, ear canal and external ear normal.     Left Ear: Tympanic membrane, ear canal and external ear normal.     Nose: Nose normal.     Mouth/Throat:     Mouth: Mucous membranes are moist.     Pharynx: Oropharynx is clear. No oropharyngeal exudate or posterior oropharyngeal erythema.  Eyes:     Extraocular Movements: Extraocular movements intact.     Conjunctiva/sclera: Conjunctivae normal.     Pupils: Pupils are equal, round, and reactive to light.  Cardiovascular:     Rate and Rhythm: Normal rate and regular rhythm.     Heart sounds: Normal heart sounds.  Pulmonary:     Effort: Pulmonary effort is normal.     Breath sounds: Normal breath sounds.  Chest:     Breasts:        Right: Normal. No swelling, bleeding, inverted nipple, mass or nipple discharge.        Left: Normal. No swelling, bleeding, inverted nipple, mass or nipple discharge.  Abdominal:     General: Bowel sounds are normal.     Palpations: Abdomen is soft.     Comments: obese  Genitourinary:  Comments: deferred Musculoskeletal: Normal range of motion.  Skin:    General: Skin is warm and dry.     Capillary Refill: Capillary refill takes less than 2 seconds.  Neurological:     General: No focal deficit present.     Mental Status: She is alert and oriented to person, place, and time.  Psychiatric:        Mood and Affect: Mood normal.         Assessment And Plan:     1. Routine general medical examination at health care facility  A full exam was performed. Importance of monthly self breast exams was discussed with the patient.  PATIENT HAS BEEN ADVISED TO GET 30-45 MINUTES REGULAR EXERCISE NO LESS THAN FOUR TO FIVE DAYS PER WEEK - BOTH WEIGHTBEARING EXERCISES AND AEROBIC ARE RECOMMENDED.  SHE IS ADVISED TO FOLLOW A HEALTHY DIET WITH AT LEAST SIX  FRUITS/VEGGIES PER DAY, DECREASE INTAKE OF RED MEAT, AND TO INCREASE FISH INTAKE TO TWO DAYS PER WEEK.  MEATS/FISH SHOULD NOT BE FRIED, BAKED OR BROILED IS PREFERABLE.  I SUGGEST WEARING SPF 50 SUNSCREEN ON EXPOSED PARTS AND ESPECIALLY WHEN IN THE DIRECT SUNLIGHT FOR AN EXTENDED PERIOD OF TIME.  PLEASE AVOID FAST FOOD RESTAURANTS AND INCREASE YOUR WATER INTAKE.  - CMP14+EGFR - CBC - Lipid panel - Hemoglobin A1c - POCT Urinalysis Dipstick (81002)  2. Need for vaccination  - Flu Vaccine QUAD 6+ mos PF IM (Fluarix Quad PF)        Maximino Greenland, MD

## 2018-06-01 LAB — FERRITIN: Ferritin: 59 ng/mL (ref 15–150)

## 2018-06-01 LAB — IRON AND TIBC
Iron Saturation: 10 % — ABNORMAL LOW (ref 15–55)
Iron: 33 ug/dL (ref 27–159)
Total Iron Binding Capacity: 324 ug/dL (ref 250–450)
UIBC: 291 ug/dL (ref 131–425)

## 2018-06-01 LAB — SPECIMEN STATUS REPORT

## 2018-06-08 NOTE — Telephone Encounter (Signed)
Called and spoke with patient-patient scheduled to see Dr. Lavon PaganiniNandigam on Monday 06/11/18 arrival at 2:45 pm for 3:00 pm appt; Patient verbalized understanding of information/instructions; Patient was advised to call back if questions/concerns arise;

## 2018-06-11 ENCOUNTER — Other Ambulatory Visit (INDEPENDENT_AMBULATORY_CARE_PROVIDER_SITE_OTHER): Payer: 59

## 2018-06-11 ENCOUNTER — Ambulatory Visit: Payer: 59 | Admitting: Gastroenterology

## 2018-06-11 ENCOUNTER — Encounter: Payer: Self-pay | Admitting: Gastroenterology

## 2018-06-11 VITALS — BP 123/60 | HR 70 | Ht 67.0 in | Wt 244.0 lb

## 2018-06-11 DIAGNOSIS — R1013 Epigastric pain: Secondary | ICD-10-CM

## 2018-06-11 DIAGNOSIS — R140 Abdominal distension (gaseous): Secondary | ICD-10-CM | POA: Diagnosis not present

## 2018-06-11 DIAGNOSIS — R109 Unspecified abdominal pain: Secondary | ICD-10-CM | POA: Diagnosis not present

## 2018-06-11 LAB — IGA: IgA: 145 mg/dL (ref 68–378)

## 2018-06-11 MED ORDER — HYOSCYAMINE SULFATE 0.125 MG SL SUBL: 0.1250 mg | SUBLINGUAL_TABLET | Freq: Two times a day (BID) | SUBLINGUAL | 1 refills | Status: DC

## 2018-06-11 NOTE — Progress Notes (Signed)
Rebecca Nicholson    [id number]    1995/08/22  Primary Care Physician:Sanders, Melina Schools, MD  Referring Physician: Dorothyann Peng, MD [address] [place], [place] [address]  Chief complaint: Abdominal pain, bloating  HPI: 23 year old female here for follow-up visit accompanied by her mother.  She was last seen in office February 14, 2018.  Continues to have intermittent generalized abdominal pain associated with excessive bloating and flatulence.  Abdominal discomfort is sometimes relieved when she passes gas.  Her symptoms are mostly triggered when she eats pork or beef.  Comprehensive allergy testing negative for alpha gal allergy. Denies any vomiting, nausea, weight loss or decreased appetite.  No blood in stool or melena.  No family history of IBD, celiac disease.  Maternal grandmother had colon cancer.   Outpatient Encounter Medications as of 06/11/2018  Medication Sig  . albuterol (PROVENTIL HFA;VENTOLIN HFA) 108 (90 BASE) MCG/ACT inhaler Inhale 2 puffs into the lungs every 6 (six) hours as needed for wheezing.  . budesonide-formoterol (SYMBICORT) 80-4.5 MCG/ACT inhaler Inhale 2 puffs into the lungs 2 (two) times daily.  . medroxyPROGESTERone (DEPO-PROVERA) 150 MG/ML injection Inject 150 mg into the muscle every 3 (three) months.   No facility-administered encounter medications on file as of 06/11/2018.     Allergies as of 06/11/2018  . (No Known Allergies)    Past Medical History:  Diagnosis Date  . Asthma    as child  . Hidradenitis suppurativa of right axilla   . Medical history non-contributory     Past Surgical History:  Procedure Laterality Date  . DENTAL SURGERY    . HYDRADENITIS EXCISION Right 06/10/2014   Procedure: EXCISION OF RIGHT AXILLARY HIDRADENITIS ;  Surgeon: Manus Rudd, MD;  Location: [facility];  Service: General;  Laterality: Right;  . TONSILLECTOMY      Family History  Problem Relation Age of Onset  .  Hypertension Mother   . Diabetes Mother   . Diabetes Maternal Aunt   . Colon cancer Maternal Grandmother   . Breast cancer Maternal Grandmother   . Diabetes Maternal Grandmother   . Cancer Father     Social History   Socioeconomic History  . Marital status: Single    Spouse name: Not on file  . Number of children: Not on file  . Years of education: Not on file  . Highest education level: Not on file  Occupational History  . Not on file  Social Needs  . Financial resource strain: Not on file  . Food insecurity:    Worry: Not on file    Inability: Not on file  . Transportation needs:    Medical: Not on file    Non-medical: Not on file  Tobacco Use  . Smoking status: Never Smoker  . Smokeless tobacco: Never Used  Substance and Sexual Activity  . Alcohol use: No  . Drug use: No  . Sexual activity: Yes    Birth control/protection: Injection  Lifestyle  . Physical activity:    Days per week: Not on file    Minutes per session: Not on file  . Stress: Not on file  Relationships  . Social connections:    Talks on phone: Not on file    Gets together: Not on file    Attends religious service: Not on file    Active member of club or organization: Not on file    Attends meetings of clubs or organizations: Not  on file    Relationship status: Not on file  . Intimate partner violence:    Fear of current or ex partner: Not on file    Emotionally abused: Not on file    Physically abused: Not on file    Forced sexual activity: Not on file  Other Topics Concern  . Not on file  Social History Narrative  . Not on file      Review of systems: Review of Systems  Constitutional: Negative for fever and chills.  HENT: Negative.   Eyes: Negative for blurred vision.  Respiratory: Negative for cough, shortness of breath and wheezing.   Cardiovascular: Negative for chest pain and palpitations.  Gastrointestinal: as per HPI Genitourinary: Negative for dysuria, urgency, frequency  and hematuria.  Musculoskeletal: Negative for myalgias, back pain and joint pain.  Skin: Negative for itching and rash.  Neurological: Negative for dizziness, tremors, focal weakness, seizures and loss of consciousness.  Endo/Heme/Allergies: Positive for seasonal allergies.  Psychiatric/Behavioral: Negative for depression, suicidal ideas and hallucinations.  All other systems reviewed and are negative.   Physical Exam: Vitals:   06/11/18 1456  BP: 123/60  Pulse: 70  SpO2: 98%   Body mass index is 38.22 kg/m. Gen:      No acute distress HEENT:  EOMI, sclera anicteric Neck:     No masses; no thyromegaly Lungs:    Clear to auscultation bilaterally; normal respiratory effort CV:         Regular rate and rhythm; no murmurs Abd:      + bowel sounds; soft, non-tender; no palpable masses, no distension Ext:    No edema; adequate peripheral perfusion Skin:      Warm and dry; no rash Neuro: alert and oriented x 3 Psych: normal mood and affect  Data Reviewed:  Reviewed labs, radiology imaging, old records and pertinent past GI work up   Assessment and Plan/Recommendations:  23 year old female with history of dyspepsia, generalized abdominal discomfort and bloating 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 pylori gastritis, peptic ulcer disease Abdominal ultrasound to exclude gallbladder disease  Continue to use simethicone 5 cc liquid up to 3 times daily as needed for excessive bloating and flatulence  Levsin 0.125 mg twice daily as needed for abdominal cramping and discomfort  The risks and benefits as well as alternatives of endoscopic procedure(s) have been discussed and reviewed. All questions answered. The patient agrees to proceed.    Rebecca Nicholson ,  MD [phone]    CC: Dorothyann Peng, MD

## 2018-06-11 NOTE — Patient Instructions (Addendum)
You have been scheduled for an abdominal ultrasound at [facility] (1st floor of hospital) on 06/18/2018 at 8:30am. Please arrive 15 minutes prior to your appointment for registration. Make certain not to have anything to eat or drink 6 hours prior to your appointment. Should you need to reschedule your appointment, please contact radiology at [phone]. This test typically takes about 30 minutes to perform.  You have been scheduled for an endoscopy. Please follow written instructions given to you at your visit today. If you use inhalers (even only as needed), please bring them with you on the day of your procedure.   Go to the basement today for labs  We have sent Levsin to your pharmacy  Use Simethicone liquid 5 cc three times a day as needed  Thank you for choosing [facility]  Kavitha Nandigam,MD

## 2018-06-12 ENCOUNTER — Encounter: Payer: Self-pay | Admitting: Gastroenterology

## 2018-06-12 LAB — TISSUE TRANSGLUTAMINASE, IGA: (tTG) Ab, IgA: 1 U/mL

## 2018-06-14 ENCOUNTER — Encounter: Payer: Self-pay | Admitting: Gastroenterology

## 2018-06-14 ENCOUNTER — Ambulatory Visit (AMBULATORY_SURGERY_CENTER): Payer: 59 | Admitting: Gastroenterology

## 2018-06-14 VITALS — BP 121/73 | HR 89 | Temp 98.0°F | Resp 26 | Ht 67.0 in | Wt 244.0 lb

## 2018-06-14 DIAGNOSIS — R109 Unspecified abdominal pain: Secondary | ICD-10-CM | POA: Diagnosis not present

## 2018-06-14 DIAGNOSIS — R1310 Dysphagia, unspecified: Secondary | ICD-10-CM | POA: Diagnosis not present

## 2018-06-14 DIAGNOSIS — R1013 Epigastric pain: Secondary | ICD-10-CM

## 2018-06-14 DIAGNOSIS — K30 Functional dyspepsia: Secondary | ICD-10-CM

## 2018-06-14 MED ORDER — SODIUM CHLORIDE 0.9 % IV SOLN: 500.0000 mL | Freq: Once | INTRAVENOUS | Status: DC

## 2018-06-14 NOTE — Op Note (Signed)
[facility] Patient Name: Rebecca MercyKristen Spicher Procedure Date: 06/14/2018 10:54 AM MRN: [id number] Endoscopist: Napoleon FormKavitha V. Nandigam , MD Age: [age] Referring MD:  Date of Birth: Sep 15, 1995 Gender: Female Account #: [id number] Procedure:                Upper GI endoscopy Indications:              Epigastric abdominal pain, Dyspepsia Medicines:                Monitored Anesthesia Care Procedure:                Pre-Anesthesia Assessment:                           - Prior to the procedure, a History and Physical                            was performed, and patient medications and                            allergies were reviewed. The patient's tolerance of                            previous anesthesia was also reviewed. The risks                            and benefits of the procedure and the sedation                            options and risks were discussed with the patient.                            All questions were answered, and informed consent                            was obtained. Prior Anticoagulants: The patient has                            taken no previous anticoagulant or antiplatelet                            agents. ASA Grade Assessment: II - A patient with                            mild systemic disease. After reviewing the risks                            and benefits, the patient was deemed in                            satisfactory condition to undergo the procedure.                           After obtaining informed consent, the endoscope was  passed under direct vision. Throughout the                            procedure, the patient's blood pressure, pulse, and                            oxygen saturations were monitored continuously. The                            Endoscope was introduced through the mouth, and                            advanced to the second part of duodenum. The upper                            GI  endoscopy was accomplished without difficulty.                            The patient tolerated the procedure well. Scope In: Scope Out: Findings:                 The Z-line was regular and was found 35 cm from the                            incisors.                           The examined esophagus was normal.                           The stomach was normal.                           The cardia and gastric fundus were normal on                            retroflexion.                           The examined duodenum was normal. Complications:            No immediate complications. Estimated Blood Loss:     Estimated blood loss was minimal. Impression:               - Z-line regular, 35 cm from the incisors.                           - Normal esophagus.                           - Normal stomach.                           - Normal examined duodenum.                           - No specimens collected. Recommendation:           -  Patient has a contact number available for                            emergencies. The signs and symptoms of potential                            delayed complications were discussed with the                            patient. Return to normal activities tomorrow.                            Written discharge instructions were provided to the                            patient.                           - Resume previous diet.                           - Continue present medications.                           - Await pathology results. Napoleon Form, MD 06/14/2018 11:07:33 AM This report has been signed electronically.

## 2018-06-14 NOTE — Patient Instructions (Signed)
Continue present medications.   YOU HAD AN ENDOSCOPIC PROCEDURE TODAY AT THE  ENDOSCOPY CENTER:   Refer to the procedure report that was given to you for any specific questions about what was found during the examination.  If the procedure report does not answer your questions, please call your gastroenterologist to clarify.  If you requested that your care partner not be given the details of your procedure findings, then the procedure report has been included in a sealed envelope for you to review at your convenience later.  YOU SHOULD EXPECT: Some feelings of bloating in the abdomen. Passage of more gas than usual.  Walking can help get rid of the air that was put into your GI tract during the procedure and reduce the bloating. If you had a lower endoscopy (such as a colonoscopy or flexible sigmoidoscopy) you may notice spotting of blood in your stool or on the toilet paper. If you underwent a bowel prep for your procedure, you may not have a normal bowel movement for a few days.  Please Note:  You might notice some irritation and congestion in your nose or some drainage.  This is from the oxygen used during your procedure.  There is no need for concern and it should clear up in a day or so.  SYMPTOMS TO REPORT IMMEDIATELY:     Following upper endoscopy (EGD)  Vomiting of blood or coffee ground material  New chest pain or pain under the shoulder blades  Painful or persistently difficult swallowing  New shortness of breath  Fever of 100F or higher  Black, tarry-looking stools  For urgent or emergent issues, a gastroenterologist can be reached at any hour by calling [phone].   DIET:  We do recommend a small meal at first, but then you may proceed to your regular diet.  Drink plenty of fluids but you should avoid alcoholic beverages for 24 hours.  ACTIVITY:  You should plan to take it easy for the rest of today and you should NOT DRIVE or use heavy machinery until tomorrow  (because of the sedation medicines used during the test).    FOLLOW UP: Our staff will call the number listed on your records the next business day following your procedure to check on you and address any questions or concerns that you may have regarding the information given to you following your procedure. If we do not reach you, we will leave a message.  However, if you are feeling well and you are not experiencing any problems, there is no need to return our call.  We will assume that you have returned to your regular daily activities without incident.  If any biopsies were taken you will be contacted by phone or by letter within the next 1-3 weeks.  Please call us at [phone] if you have not heard about the biopsies in 3 weeks.    SIGNATURES/CONFIDENTIALITY: You and/or your care partner have signed paperwork which will be entered into your electronic medical record.  These signatures attest to the fact that that the information above on your After Visit Summary has been reviewed and is understood.  Full responsibility of the confidentiality of this discharge information lies with you and/or your care-partner. 

## 2018-06-14 NOTE — Progress Notes (Signed)
Report to PACU, RN, vss, BBS= Clear.  

## 2018-06-15 ENCOUNTER — Telehealth: Payer: Self-pay

## 2018-06-15 NOTE — Telephone Encounter (Signed)
  Follow up Call-  Call back number 06/14/2018  Post procedure Call Back phone  # [phone]  Permission to leave phone message Yes  Some recent data might be hidden     Patient questions:  Do you have a fever, pain , or abdominal swelling? No. Pain Score  0 *  Have you tolerated food without any problems? Yes.    Have you been able to return to your normal activities? Yes.    Do you have any questions about your discharge instructions: Diet   No. Medications  No. Follow up visit  No.  Do you have questions or concerns about your Care? Pt. Had a question about her upcoming ultrasound Monday.  I told pt. To call the facility where it is scheduled to inquire there for the most accurate information for a successful procedure.  Actions: * If pain score is 4 or above: No action needed, pain <4.

## 2018-06-18 ENCOUNTER — Ambulatory Visit (HOSPITAL_COMMUNITY)
Admission: RE | Admit: 2018-06-18 | Discharge: 2018-06-18 | Disposition: A | Discharge status: Home / Self Care | Payer: 59 | Source: Ambulatory Visit | Attending: Gastroenterology | Admitting: Gastroenterology

## 2018-06-18 DIAGNOSIS — R1013 Epigastric pain: Secondary | ICD-10-CM | POA: Insufficient documentation

## 2018-06-18 DIAGNOSIS — R109 Unspecified abdominal pain: Secondary | ICD-10-CM | POA: Diagnosis not present

## 2018-06-18 DIAGNOSIS — R140 Abdominal distension (gaseous): Secondary | ICD-10-CM | POA: Diagnosis not present

## 2018-07-09 DIAGNOSIS — Z01419 Encounter for gynecological examination (general) (routine) without abnormal findings: Secondary | ICD-10-CM | POA: Diagnosis not present

## 2018-07-09 DIAGNOSIS — Z3042 Encounter for surveillance of injectable contraceptive: Secondary | ICD-10-CM | POA: Diagnosis not present

## 2018-07-09 DIAGNOSIS — Z6838 Body mass index (BMI) 38.0-38.9, adult: Secondary | ICD-10-CM | POA: Diagnosis not present

## 2018-08-27 DIAGNOSIS — R2232 Localized swelling, mass and lump, left upper limb: Secondary | ICD-10-CM | POA: Diagnosis not present

## 2018-08-27 DIAGNOSIS — R2231 Localized swelling, mass and lump, right upper limb: Secondary | ICD-10-CM | POA: Diagnosis not present

## 2018-08-28 ENCOUNTER — Telehealth: Payer: Self-pay

## 2018-08-28 NOTE — Telephone Encounter (Signed)
Voicemail full.  I returned the pt's call because she wanted to know who did surgery on her in 2016 because she was having the same issues again.  The pt didn't say what the problem was.

## 2018-10-01 DIAGNOSIS — Z3009 Encounter for other general counseling and advice on contraception: Secondary | ICD-10-CM | POA: Diagnosis not present

## 2018-11-08 ENCOUNTER — Other Ambulatory Visit: Payer: Self-pay

## 2018-11-08 ENCOUNTER — Encounter: Payer: Self-pay | Admitting: Internal Medicine

## 2018-11-08 ENCOUNTER — Telehealth: Payer: Self-pay | Admitting: General Practice

## 2018-11-08 ENCOUNTER — Encounter (INDEPENDENT_AMBULATORY_CARE_PROVIDER_SITE_OTHER): Payer: Self-pay

## 2018-11-08 ENCOUNTER — Ambulatory Visit (INDEPENDENT_AMBULATORY_CARE_PROVIDER_SITE_OTHER): Payer: 59 | Admitting: Internal Medicine

## 2018-11-08 ENCOUNTER — Other Ambulatory Visit: Payer: 59

## 2018-11-08 ENCOUNTER — Telehealth: Payer: Self-pay

## 2018-11-08 VITALS — Temp 98.0°F | Ht 67.0 in

## 2018-11-08 DIAGNOSIS — R509 Fever, unspecified: Secondary | ICD-10-CM

## 2018-11-08 DIAGNOSIS — Z20822 Contact with and (suspected) exposure to covid-19: Secondary | ICD-10-CM

## 2018-11-08 DIAGNOSIS — J4541 Moderate persistent asthma with (acute) exacerbation: Secondary | ICD-10-CM | POA: Diagnosis not present

## 2018-11-08 DIAGNOSIS — R6889 Other general symptoms and signs: Secondary | ICD-10-CM | POA: Diagnosis not present

## 2018-11-08 MED ORDER — AZITHROMYCIN 250 MG PO TABS: ORAL_TABLET | ORAL | 0 refills | Status: AC

## 2018-11-08 NOTE — Telephone Encounter (Signed)
-----   Message from Michelle Nasuti, [place] sent at 11/08/2018  9:18 AM EDT ----- Good morning,  The patient had a virtual appointment and Dr. Baird Cancer would like the pt to be contacted for  Covid testing.  Thank you.

## 2018-11-08 NOTE — Patient Instructions (Signed)
Asthma, Adult    Asthma is a long-term (chronic) condition in which the airways get tight and narrow. The airways are the breathing passages that lead from the nose and mouth down into the lungs. A person with asthma will have times when symptoms get worse. These are called asthma attacks. They can cause coughing, whistling sounds when you breathe (wheezing), shortness of breath, and chest pain. They can make it hard to breathe. There is no cure for asthma, but medicines and lifestyle changes can help control it.  There are many things that can bring on an asthma attack or make asthma symptoms worse (triggers). Common triggers include:  · Mold.  · Dust.  · Cigarette smoke.  · Cockroaches.  · Things that can cause allergy symptoms (allergens). These include animal skin flakes (dander) and pollen from trees or grass.  · Things that pollute the air. These may include household cleaners, wood smoke, smog, or chemical odors.  · Cold air, weather changes, and wind.  · Crying or laughing hard.  · Stress.  · Certain medicines or drugs.  · Certain foods such as dried fruit, potato chips, and grape juice.  · Infections, such as a cold or the flu.  · Certain medical conditions or diseases.  · Exercise or tiring activities.  Asthma may be treated with medicines and by staying away from the things that cause asthma attacks. Types of medicines may include:  · Controller medicines. These help prevent asthma symptoms. They are usually taken every day.  · Fast-acting reliever or rescue medicines. These quickly relieve asthma symptoms. They are used as needed and provide short-term relief.  · Allergy medicines if your attacks are brought on by allergens.  · Medicines to help control the body's defense (immune) system.  Follow these instructions at home:  Avoiding triggers in your home  · Change your heating and air conditioning filter often.  · Limit your use of fireplaces and wood stoves.  · Get rid of pests (such as roaches and  mice) and their droppings.  · Throw away plants if you see mold on them.  · Clean your floors. Dust regularly. Use cleaning products that do not smell.  · Have someone vacuum when you are not home. Use a vacuum cleaner with a HEPA filter if possible.  · Replace carpet with wood, tile, or vinyl flooring. Carpet can trap animal skin flakes and dust.  · Use allergy-proof pillows, mattress covers, and box spring covers.  · Wash bed sheets and blankets every week in hot water. Dry them in a dryer.  · Keep your bedroom free of any triggers.   · Avoid pets and keep windows closed when things that cause allergy symptoms are in the air.  · Use blankets that are made of polyester or cotton.  · Clean bathrooms and kitchens with bleach. If possible, have someone repaint the walls in these rooms with mold-resistant paint. Keep out of the rooms that are being cleaned and painted.  · Wash your hands often with soap and water. If soap and water are not available, use hand sanitizer.  · Do not allow anyone to smoke in your home.  General instructions  · Take over-the-counter and prescription medicines only as told by your doctor.  ? Talk with your doctor if you have questions about how or when to take your medicines.  ? Make note if you need to use your medicines more often than usual.  · Do not use any products that   contain nicotine or tobacco, such as cigarettes and e-cigarettes. If you need help quitting, ask your doctor.  · Stay away from secondhand smoke.  · Avoid doing things outdoors when allergen counts are high and when air quality is low.  · Wear a ski mask when doing outdoor activities in the winter. The mask should cover your nose and mouth. Exercise indoors on cold days if you can.  · Warm up before you exercise. Take time to cool down after exercise.  · Use a peak flow meter as told by your doctor. A peak flow meter is a tool that measures how well the lungs are working.  · Keep track of the peak flow meter's readings.  Write them down.  · Follow your asthma action plan. This is a written plan for taking care of your asthma and treating your attacks.  · Make sure you get all the shots (vaccines) that your doctor recommends. Ask your doctor about a flu shot and a pneumonia shot.  · Keep all follow-up visits as told by your doctor. This is important.  Contact a doctor if:  · You have wheezing, shortness of breath, or a cough even while taking medicine to prevent attacks.  · The mucus you cough up (sputum) is thicker than usual.  · The mucus you cough up changes from clear or white to yellow, green, gray, or bloody.  · You have problems from the medicine you are taking, such as:  ? A rash.  ? Itching.  ? Swelling.  ? Trouble breathing.  · You need reliever medicines more than 2-3 times a week.  · Your peak flow reading is still at 50-79% of your personal best after following the action plan for 1 hour.  · You have a fever.  Get help right away if:  · You seem to be worse and are not responding to medicine during an asthma attack.  · You are short of breath even at rest.  · You get short of breath when doing very little activity.  · You have trouble eating, drinking, or talking.  · You have chest pain or tightness.  · You have a fast heartbeat.  · Your lips or fingernails start to turn blue.  · You are light-headed or dizzy, or you faint.  · Your peak flow is less than 50% of your personal best.  · You feel too tired to breathe normally.  Summary  · Asthma is a long-term (chronic) condition in which the airways get tight and narrow. An asthma attack can make it hard to breathe.  · Asthma cannot be cured, but medicines and lifestyle changes can help control it.  · Make sure you understand how to avoid triggers and how and when to use your medicines.  This information is not intended to replace advice given to you by your health care provider. Make sure you discuss any questions you have with your health care provider.  Document  Released: 11/02/2007 Document Revised: 06/20/2016 Document Reviewed: 06/20/2016  Elsevier Interactive Patient Education © 2019 [employer]

## 2018-11-08 NOTE — Addendum Note (Signed)
Addended by: Denman George on: 11/08/2018 10:03 AM   Modules accepted: Orders

## 2018-11-08 NOTE — Progress Notes (Signed)
Virtual Visit via Video   This visit type was conducted due to national recommendations for restrictions regarding the COVID-19 Pandemic (e.g. social distancing) in an effort to limit this patient's exposure and mitigate transmission in our community.  Due to her co-morbid illnesses, this patient is at least at moderate risk for complications without adequate follow up.  This format is felt to be most appropriate for this patient at this time.  All issues noted in this document were discussed and addressed.  A limited physical exam was performed with this format.    This visit type was conducted due to national recommendations for restrictions regarding the COVID-19 Pandemic (e.g. social distancing) in an effort to limit this patient's exposure and mitigate transmission in our community.  Patients identity confirmed using two different identifiers.  This format is felt to be most appropriate for this patient at this time.  All issues noted in this document were discussed and addressed.  No physical exam was performed (except for noted visual exam findings with Video Visits).    Date:  11/10/2018   ID:  Rebecca Nicholson, DOB 07-06-1995, MRN [id number]  Patient Location:  Home  Provider location:   Office    Chief Complaint:  Asthma f/u  History of Present Illness:    Rebecca Nicholson is a 23 y.o. female who presents via video conferencing for a telehealth visit today.    The patient does have symptoms concerning for COVID-19 infection (fever, chills, cough, or new shortness of breath).   She presents today for virtual visit. She prefers this method of contact due to COVID-19 pandemic.  She was seen at [facility] on 6/9 for further evaluation of cough, shortness of breath and fever. She was advised to get Coronavirus testing, yet this has not been done yet. She reports her fever went up to 102. She started feeling bad this past Sunday. She is feeling better today and plans to  return to work tomorrow.   Asthma She complains of cough. This is a chronic problem. The current episode started more than 1 year ago. The problem occurs intermittently. The problem has been gradually improving. The cough is non-productive. Associated symptoms include a fever. She reports moderate improvement on treatment. Her past medical history is significant for asthma.     Past Medical History:  Diagnosis Date  . Anemia   . Asthma    as child  . Heart murmur   . Hidradenitis suppurativa of right axilla   . Medical history non-contributory   . Moderate persistent asthma without complication 09/73/5329   Past Surgical History:  Procedure Laterality Date  . DENTAL SURGERY    . HYDRADENITIS EXCISION Right 06/10/2014   Procedure: EXCISION OF RIGHT AXILLARY HIDRADENITIS ;  Surgeon: Donnie Mesa, MD;  Location: [facility];  Service: General;  Laterality: Right;  . TONSILLECTOMY       Current Meds  Medication Sig  . albuterol (VENTOLIN HFA) 108 (90 Base) MCG/ACT inhaler Inhale 2 puffs into the lungs every 6 (six) hours as needed for wheezing.  . budesonide-formoterol (SYMBICORT) 80-4.5 MCG/ACT inhaler Inhale 2 puffs into the lungs 2 (two) times daily.  . hyoscyamine (LEVSIN SL) 0.125 MG SL tablet Place 1 tablet (0.125 mg total) under the tongue 2 (two) times daily.  . medroxyPROGESTERone (DEPO-PROVERA) 150 MG/ML injection Inject 150 mg into the muscle every 3 (three) months.  . [DISCONTINUED] albuterol (PROVENTIL HFA;VENTOLIN HFA) 108 (90 BASE) MCG/ACT inhaler Inhale 2 puffs  into the lungs every 6 (six) hours as needed for wheezing.  . [DISCONTINUED] budesonide-formoterol (SYMBICORT) 80-4.5 MCG/ACT inhaler Inhale 2 puffs into the lungs 2 (two) times daily.     Allergies:   Patient has no known allergies.   Social History   Tobacco Use  . Smoking status: Never Smoker  . Smokeless tobacco: Never Used  Substance Use Topics  . Alcohol use: No  . Drug use: No      Family Hx: The patient's family history includes Breast cancer in her maternal grandmother; Cancer in her father; Colon cancer in her maternal grandmother; Diabetes in her maternal aunt and mother; Hypertension in her mother. There is no history of Esophageal cancer, Rectal cancer, or Stomach cancer.  ROS:   Please see the history of present illness.    Review of Systems  Constitutional: Positive for fever.  Respiratory: Positive for cough.   Cardiovascular: Negative.   Gastrointestinal: Negative.   Neurological: Negative.   Psychiatric/Behavioral: Negative.     All other systems reviewed and are negative.   Labs/Other Tests and Data Reviewed:    Recent Labs: 05/09/2018: ALT 12; BUN 15; Creatinine, Ser 0.68; Hemoglobin 11.6; Platelets 405; Potassium 4.3; Sodium 140   Recent Lipid Panel Lab Results  Component Value Date/Time   CHOL 166 05/09/2018 10:23 AM   TRIG 36 05/09/2018 10:23 AM   HDL 44 05/09/2018 10:23 AM   CHOLHDL 3.8 05/09/2018 10:23 AM   LDLCALC 115 (H) 05/09/2018 10:23 AM    Wt Readings from Last 3 Encounters:  06/14/18 244 lb (110.7 kg)  06/11/18 244 lb (110.7 kg)  05/09/18 237 lb (107.5 kg)     Exam:    Vital Signs:  Temp 98 F (36.7 C) (Oral) Comment: pt provided  Ht 5\' 7"  (1.702 m)   LMP  (LMP Unknown)   BMI 38.22 kg/m     Physical Exam  Constitutional: She is oriented to person, place, and time and well-developed, well-nourished, and in no distress.  HENT:  Head: Normocephalic and atraumatic.  Neck: Normal range of motion.  Pulmonary/Chest: Effort normal.  Neurological: She is alert and oriented to person, place, and time.  Psychiatric: Affect normal.  Nursing note and vitals reviewed. She is able to speak in full sentences.   ASSESSMENT & PLAN:    1. Moderate persistent asthma with acute exacerbation  Chronic. She will continue with current inhalers. I will send rx in for Zpack. She is encouraged to complete full abx course.   2.  Suspected Covid-19 Virus Infection  She denies known exposure. I will refer her to [facility] to schedule testing. She is encouraged to not return to work until her test results are available. Pt reminded that her h/o asthma increases her risk of complications should she get the virus, she is encouraged to wear mask in public.   3. Febrile illness  She agrees to COVID-19 testing. She will take temps daily and use Tylenol as needed.  - Temperature monitoring; Future  COVID-19 Education: The signs and symptoms of COVID-19 were discussed with the patient and how to seek care for testing (follow up with PCP or arrange E-visit).  The importance of social distancing was discussed today.  Patient Risk:   After full review of this patients clinical status, I feel that they are at least moderate risk at this time.  Time:   Today, I have spent 13 minutes/ 30 seconds with the patient with telehealth technology discussing above diagnoses.  This is a  failed virtual visit - I could see her; however, I was unable to hear her. Remainder of call was done via phone.    Medication Adjustments/Labs and Tests Ordered: Current medicines are reviewed at length with the patient today.  Concerns regarding medicines are outlined above.   Tests Ordered: No orders of the defined types were placed in this encounter.   Medication Changes: Meds ordered this encounter  Medications  . azithromycin (ZITHROMAX Z-PAK) 250 MG tablet    Sig: Take 2 tablets (500 mg) on  Day 1,  followed by 1 tablet (250 mg) once daily on Days 2 through 5.    Dispense:  6 each    Refill:  0  . budesonide-formoterol (SYMBICORT) 80-4.5 MCG/ACT inhaler    Sig: Inhale 2 puffs into the lungs 2 (two) times daily.    Dispense:  1 Inhaler    Refill:  11  . albuterol (VENTOLIN HFA) 108 (90 Base) MCG/ACT inhaler    Sig: Inhale 2 puffs into the lungs every 6 (six) hours as needed for wheezing.    Dispense:  1 Inhaler    Refill:  11    Disposition:   Follow up prn  Signed, Gwynneth Alimentobyn N Ferdinand Revoir, MD

## 2018-11-08 NOTE — Telephone Encounter (Signed)
Pt has been scheduled for covid testing.  Pt was referred by: Glendale Chard, MD

## 2018-11-08 NOTE — Telephone Encounter (Signed)
The pt was notified that she would have to have her appt virtually or a curbside visit because she has a cough.  The pt said she is fine with doing virtual or curbside.  The pt was told that she would have a virtual appt.

## 2018-11-09 ENCOUNTER — Encounter (INDEPENDENT_AMBULATORY_CARE_PROVIDER_SITE_OTHER): Payer: Self-pay

## 2018-11-10 ENCOUNTER — Encounter (INDEPENDENT_AMBULATORY_CARE_PROVIDER_SITE_OTHER): Payer: Self-pay

## 2018-11-10 ENCOUNTER — Encounter: Payer: Self-pay | Admitting: Internal Medicine

## 2018-11-10 LAB — NOVEL CORONAVIRUS, NAA: SARS-CoV-2, NAA: NOT DETECTED

## 2018-11-10 MED ORDER — ALBUTEROL SULFATE HFA 108 (90 BASE) MCG/ACT IN AERS: 2.0000 | INHALATION_SPRAY | Freq: Four times a day (QID) | RESPIRATORY_TRACT | 11 refills | Status: DC | PRN

## 2018-11-10 MED ORDER — BUDESONIDE-FORMOTEROL FUMARATE 80-4.5 MCG/ACT IN AERO: 2.0000 | INHALATION_SPRAY | Freq: Two times a day (BID) | RESPIRATORY_TRACT | 11 refills | Status: DC

## 2018-11-12 ENCOUNTER — Encounter (INDEPENDENT_AMBULATORY_CARE_PROVIDER_SITE_OTHER): Payer: Self-pay

## 2018-11-12 ENCOUNTER — Encounter: Payer: Self-pay | Admitting: Internal Medicine

## 2019-01-01 ENCOUNTER — Telehealth: Payer: Self-pay

## 2019-01-01 NOTE — Telephone Encounter (Signed)
I returned the pt's call and notified her that her physical office notes are ready for pickup.

## 2019-01-02 ENCOUNTER — Encounter: Payer: Self-pay | Admitting: Internal Medicine

## 2019-01-10 ENCOUNTER — Ambulatory Visit: Payer: Self-pay | Admitting: Internal Medicine

## 2019-03-05 ENCOUNTER — Encounter (HOSPITAL_COMMUNITY): Payer: Self-pay

## 2019-03-05 ENCOUNTER — Other Ambulatory Visit: Payer: Self-pay

## 2019-03-05 ENCOUNTER — Ambulatory Visit (HOSPITAL_COMMUNITY)
Admission: EM | Admit: 2019-03-05 | Discharge: 2019-03-05 | Disposition: A | Discharge status: Home / Self Care | Payer: 59 | Attending: Family Medicine | Admitting: Family Medicine

## 2019-03-05 ENCOUNTER — Telehealth: Payer: Self-pay

## 2019-03-05 DIAGNOSIS — R21 Rash and other nonspecific skin eruption: Secondary | ICD-10-CM

## 2019-03-05 DIAGNOSIS — L299 Pruritus, unspecified: Secondary | ICD-10-CM

## 2019-03-05 MED ORDER — PREDNISONE 10 MG (21) PO TBPK: ORAL_TABLET | Freq: Every day | ORAL | 0 refills | Status: DC

## 2019-03-05 NOTE — Telephone Encounter (Signed)
PT MOTHER LVM REQ APPT FOR PT STATING THAT SHE HAS SKIN RASH.Marland KitchenUNABLE TO LVM DUE TO MAILBOX FULL.Marland Kitchen ATT TO CONTACT MOTHER NUMBER THAT WAS ALSO LEFT FOR RETURN CALL, PHONE RINGS ONCE THING HANGS UP UNABLE TO LVM

## 2019-03-05 NOTE — ED Provider Notes (Signed)
[facility]   [id number] 03/05/19 Arrival Time: 1534  ASSESSMENT & PLAN:  1. Rash and nonspecific skin eruption   2. Pruritus     Question chigger bites. Discussed.  For itching: Meds ordered this encounter  Medications  . predniSONE (STERAPRED UNI-PAK 21 TAB) 10 MG (21) TBPK tablet    Sig: Take by mouth daily. Take as directed.    Dispense:  21 tablet    Refill:  0   OTC Benadryl if needed.  Follow-up Information    Dorothyann Peng, MD.   Specialty: Internal Medicine Why: As needed. Contact information: [address] [place] [address] [phone]        [facility].   Specialty: Urgent Care Why: If worsening or failing to improve as anticipated. Contact information: [address] [place] [address] [phone]          Reviewed expectations re: course of current medical issues. Questions answered. Outlined signs and symptoms indicating need for more acute intervention. Patient verbalized understanding. After Visit Summary given.   SUBJECTIVE:  Rebecca Nicholson is a 22 y.o. female who presents with a skin complaint.   Location: trunk and extremities Onset: gradual Duration: first noted 4-5 d ago Associated pruritis? moderate Associated pain? none Progression: stable  Drainage? none Known trigger? No  New soaps/lotions/topicals/detergents? No  Environmental exposures? No  Contacts with similar? No  Recent travel? No  Other associated symptoms: none Therapies tried thus far: topical hydrocortisone cream without much relief Arthralgia or myalgia? none Recent illness? none Fever? none New medications? none No specific aggravating or alleviating factors reported.  ROS: As per HPI.  OBJECTIVE: Vitals:   03/05/19 1547  BP: 130/74  Pulse: 79  Resp: 16  Temp: 98.9 F (37.2 C)  TempSrc: Oral  SpO2: 99%    General appearance: alert; no distress HEENT: [place];  AT Neck: supple with FROM Lungs: clear to auscultation bilaterally Heart: regular rate and rhythm Extremities: no edema; moves all extremities normally Skin: warm and dry; signs of infection: no; scattered erythematous papules on trunk and extremities; no active bleeding or drainage; areas are non-tender Psychological: alert and cooperative; normal mood and affect  No Known Allergies  Past Medical History:  Diagnosis Date  . Anemia   . Asthma    as child  . Heart murmur   . Hidradenitis suppurativa of right axilla   . Medical history non-contributory   . Moderate persistent asthma without complication 03/14/2018   Social History   Socioeconomic History  . Marital status: Single    Spouse name: Not on file  . Number of children: Not on file  . Years of education: Not on file  . Highest education level: Not on file  Occupational History  . Not on file  Social Needs  . Financial resource strain: Not on file  . Food insecurity    Worry: Not on file    Inability: Not on file  . Transportation needs    Medical: Not on file    Non-medical: Not on file  Tobacco Use  . Smoking status: Never Smoker  . Smokeless tobacco: Never Used  Substance and Sexual Activity  . Alcohol use: No  . Drug use: No  . Sexual activity: Yes    Birth control/protection: Injection  Lifestyle  . Physical activity    Days per week: Not on file    Minutes per session: Not on file  . Stress: Not  on file  Relationships  . Social Herbalist on phone: Not on file    Gets together: Not on file    Attends religious service: Not on file    Active member of club or organization: Not on file    Attends meetings of clubs or organizations: Not on file    Relationship status: Not on file  . Intimate partner violence    Fear of current or ex partner: Not on file    Emotionally abused: Not on file    Physically abused: Not on file    Forced sexual activity: Not on file  Other Topics Concern   . Not on file  Social History Narrative  . Not on file   Family History  Problem Relation Age of Onset  . Hypertension Mother   . Diabetes Mother   . Diabetes Maternal Aunt   . Colon cancer Maternal Grandmother   . Breast cancer Maternal Grandmother   . Cancer Father   . Esophageal cancer Neg Hx   . Rectal cancer Neg Hx   . Stomach cancer Neg Hx    Past Surgical History:  Procedure Laterality Date  . DENTAL SURGERY    . HYDRADENITIS EXCISION Right 06/10/2014   Procedure: EXCISION OF RIGHT AXILLARY HIDRADENITIS ;  Surgeon: Donnie Mesa, MD;  Location: [facility];  Service: General;  Laterality: Right;  . Evonnie Dawes, MD 03/06/19 [phone]

## 2019-03-05 NOTE — Telephone Encounter (Signed)
Pt called stating that she has 2 missed call from office. Pt needed an appt but was not available for any appt due to work schedules. Pt stated that she would just go to urgent care when she got off from her second job.

## 2019-03-05 NOTE — ED Triage Notes (Signed)
Pt presents to UC w/ c/o itchy bumps on forearms that has spread to entire body x3-4 days. Pt has used benadryl and hydrocortisone cream without relief.

## 2019-03-06 ENCOUNTER — Other Ambulatory Visit: Payer: Self-pay

## 2019-03-06 DIAGNOSIS — Z20822 Contact with and (suspected) exposure to covid-19: Secondary | ICD-10-CM

## 2019-03-07 ENCOUNTER — Other Ambulatory Visit: Payer: Self-pay

## 2019-03-07 DIAGNOSIS — Z20822 Contact with and (suspected) exposure to covid-19: Secondary | ICD-10-CM

## 2019-03-08 LAB — NOVEL CORONAVIRUS, NAA: SARS-CoV-2, NAA: NOT DETECTED

## 2019-03-09 LAB — NOVEL CORONAVIRUS, NAA: SARS-CoV-2, NAA: NOT DETECTED

## 2019-03-11 ENCOUNTER — Encounter (HOSPITAL_COMMUNITY): Payer: Self-pay

## 2019-03-11 ENCOUNTER — Ambulatory Visit (HOSPITAL_COMMUNITY)
Admission: EM | Admit: 2019-03-11 | Discharge: 2019-03-11 | Disposition: A | Discharge status: Home / Self Care | Payer: 59 | Attending: Family Medicine | Admitting: Family Medicine

## 2019-03-11 ENCOUNTER — Other Ambulatory Visit: Payer: Self-pay

## 2019-03-11 DIAGNOSIS — L42 Pityriasis rosea: Secondary | ICD-10-CM

## 2019-03-11 NOTE — ED Triage Notes (Signed)
Pt presents to UC w/ c/o worsening spread of rash to abdomen and arm.

## 2019-03-11 NOTE — ED Triage Notes (Signed)
Pt  Is following up on recently visit concerning a rash that has started to spread. Pt states the rash is not itching any more but just spreading.

## 2019-03-12 ENCOUNTER — Other Ambulatory Visit: Payer: Self-pay

## 2019-03-12 ENCOUNTER — Encounter: Payer: Self-pay | Admitting: Nurse Practitioner

## 2019-03-12 ENCOUNTER — Ambulatory Visit: Payer: 59 | Admitting: Nurse Practitioner

## 2019-03-12 ENCOUNTER — Telehealth: Payer: Self-pay

## 2019-03-12 ENCOUNTER — Encounter: Payer: Self-pay | Admitting: Internal Medicine

## 2019-03-12 VITALS — BP 110/70 | Temp 98.9°F

## 2019-03-12 DIAGNOSIS — R21 Rash and other nonspecific skin eruption: Secondary | ICD-10-CM | POA: Diagnosis not present

## 2019-03-12 DIAGNOSIS — Z20822 Contact with and (suspected) exposure to covid-19: Secondary | ICD-10-CM

## 2019-03-12 DIAGNOSIS — Z20828 Contact with and (suspected) exposure to other viral communicable diseases: Secondary | ICD-10-CM | POA: Diagnosis not present

## 2019-03-12 MED ORDER — PERMETHRIN 1 % EX LOTN: TOPICAL_LOTION | CUTANEOUS | 1 refills | Status: DC

## 2019-03-12 NOTE — Telephone Encounter (Signed)
The pt was scheduled an appt for evaluation of a rash that she says that is appearing in different places on her body.

## 2019-03-12 NOTE — Progress Notes (Signed)
Subjective:     Patient ID: Rebecca Nicholson , female    DOB: Nov 10, 1995 , 23 y.o.   MRN: [id number]   Chief Complaint  Patient presents with  . Rash    patient stated she has a rash all over her body except her face that has been there for the past 3 weeks. she stated it is really itchy and dry.    HPI  No new clothes, same detergent.    She was exposed to her sister and no lost of   Rash This is a new problem. The current episode started 1 to 4 weeks ago. The problem has been gradually worsening since onset. Location: she does not have it on her face. (Worse after taking a shower and at night) Treatments tried: steroid. There is no history of asthma or eczema.     Past Medical History:  Diagnosis Date  . Anemia   . Asthma    as child  . Heart murmur   . Hidradenitis suppurativa of right axilla   . Medical history non-contributory   . Moderate persistent asthma without complication 03/14/2018     Family History  Problem Relation Age of Onset  . Hypertension Mother   . Diabetes Mother   . Diabetes Maternal Aunt   . Colon cancer Maternal Grandmother   . Breast cancer Maternal Grandmother   . Cancer Father   . Esophageal cancer Neg Hx   . Rectal cancer Neg Hx   . Stomach cancer Neg Hx      Current Outpatient Medications:  .  albuterol (VENTOLIN HFA) 108 (90 Base) MCG/ACT inhaler, Inhale 2 puffs into the lungs every 6 (six) hours as needed for wheezing., Disp: 1 Inhaler, Rfl: 11 .  budesonide-formoterol (SYMBICORT) 80-4.5 MCG/ACT inhaler, Inhale 2 puffs into the lungs 2 (two) times daily., Disp: 1 Inhaler, Rfl: 11 .  norethindrone-ethinyl estradiol (FEMHRT 1/5) 1-5 MG-MCG TABS tablet, Take by mouth daily., Disp: , Rfl:    No Known Allergies   Review of Systems  Skin: Positive for rash.     Today's Vitals   03/12/19 1435  BP: 110/70  Temp: 98.9 F (37.2 C)  TempSrc: Oral  PainSc: 0-No pain   There is no height or weight on file to calculate BMI.    Objective:  Physical Exam Vitals signs reviewed.  Constitutional:      Appearance: Normal appearance.  Cardiovascular:     Rate and Rhythm: Normal rate and regular rhythm.     Pulses: Normal pulses.     Heart sounds: Normal heart sounds. No murmur.  Pulmonary:     Effort: Pulmonary effort is normal. No respiratory distress.     Breath sounds: Normal breath sounds.  Skin:    General: Skin is warm.     Capillary Refill: Capillary refill takes less than 2 seconds.     Findings: Rash present.  Neurological:     General: No focal deficit present.     Mental Status: She is alert and oriented to person, place, and time.         Assessment And Plan:     1. Rash and nonspecific skin eruption  She has fine rash to arms bilaterally and chest area.  Will treat with premetherine and recheck coronavirus test due to recent exposure  She has taken steroids with some benefit - Allergens(96) Foods - Novel Coronavirus, NAA ([facility])  2. Close exposure to 2019 novel coronavirus  Family member with coronavirus I will recheck  her again today - Novel Coronavirus, NAA ([facility])   Minette Brine, FNP    THE PATIENT IS ENCOURAGED TO PRACTICE SOCIAL DISTANCING DUE TO THE COVID-19 PANDEMIC.

## 2019-03-13 ENCOUNTER — Encounter: Payer: Self-pay | Admitting: Nurse Practitioner

## 2019-03-13 LAB — NOVEL CORONAVIRUS, NAA: SARS-CoV-2, NAA: NOT DETECTED

## 2019-03-13 NOTE — ED Provider Notes (Signed)
[place]   [id number] 03/11/19 Arrival Time: 8413  ASSESSMENT & PLAN:  1. Pityriasis rosea     Written information given. Discussed this diagnosis and typical duration. Observation. Activities as tolerated. Itching has resolved.  Will follow up with PCP or here if worsening or failing to improve as anticipated. Reviewed expectations re: course of current medical issues. Questions answered. Outlined signs and symptoms indicating need for more acute intervention. Patient verbalized understanding. After Visit Summary given.   SUBJECTIVE:  Rebecca Nicholson is a 23 y.o. female who presents with a skin complaint.   Location: trunk and extremities Onset: gradual Duration: first noted approx 10-12 d ago Associated pruritis? none now; completed course of prednisone; helped Associated pain? none Progression: stable  Drainage? none Known trigger? No  New soaps/lotions/topicals/detergents? No  Environmental exposures? No  Contacts with similar? No  Recent travel? No  Other associated symptoms: none Therapies tried thus far: prednisone; itching resolved Arthralgia or myalgia? none Recent illness? none Fever? none New medications? none No specific aggravating or alleviating factors reported.  ROS: As per HPI.  OBJECTIVE: Vitals:   03/11/19 1040  BP: 131/81  Pulse: 76  Resp: 16  Temp: 98.1 F (36.7 C)  TempSrc: Temporal  SpO2: 96%    General appearance: alert; no distress HEENT: [place]; AT Neck: supple with FROM Lungs: clear to auscultation bilaterally Heart: regular rate and rhythm Extremities: no edema; moves all extremities normally Skin: warm and dry; signs of infection: no; scattered erythematous papules on trunk and extremities; no active bleeding or drainage; areas are non-tender Psychological: alert and cooperative; normal mood and affect  No Known Allergies  Past Medical History:  Diagnosis Date  . Anemia   . Asthma    as child  . Heart  murmur   . Hidradenitis suppurativa of right axilla   . Medical history non-contributory   . Moderate persistent asthma without complication 24/40/1027   Social History   Socioeconomic History  . Marital status: Single    Spouse name: Not on file  . Number of children: Not on file  . Years of education: Not on file  . Highest education level: Not on file  Occupational History  . Not on file  Social Needs  . Financial resource strain: Not on file  . Food insecurity    Worry: Not on file    Inability: Not on file  . Transportation needs    Medical: Not on file    Non-medical: Not on file  Tobacco Use  . Smoking status: Never Smoker  . Smokeless tobacco: Never Used  Substance and Sexual Activity  . Alcohol use: No  . Drug use: No  . Sexual activity: Yes    Birth control/protection: Injection  Lifestyle  . Physical activity    Days per week: Not on file    Minutes per session: Not on file  . Stress: Not on file  Relationships  . Social Herbalist on phone: Not on file    Gets together: Not on file    Attends religious service: Not on file    Active member of club or organization: Not on file    Attends meetings of clubs or organizations: Not on file    Relationship status: Not on file  . Intimate partner violence    Fear of current or ex partner: Not on file    Emotionally abused: Not on file    Physically abused: Not on file  Forced sexual activity: Not on file  Other Topics Concern  . Not on file  Social History Narrative  . Not on file   Family History  Problem Relation Age of Onset  . Hypertension Mother   . Diabetes Mother   . Diabetes Maternal Aunt   . Colon cancer Maternal Grandmother   . Breast cancer Maternal Grandmother   . Cancer Father   . Esophageal cancer Neg Hx   . Rectal cancer Neg Hx   . Stomach cancer Neg Hx    Past Surgical History:  Procedure Laterality Date  . DENTAL SURGERY    . HYDRADENITIS EXCISION Right 06/10/2014    Procedure: EXCISION OF RIGHT AXILLARY HIDRADENITIS ;  Surgeon: Manus Rudd, MD;  [facility]:  [facility];  Service: General;  Laterality: Right;  . Greig Right, MD 03/13/19 [phone]

## 2019-03-14 LAB — ALLERGENS(96) FOODS
Allergen Apple, IgE: <0.1 kU/L
Allergen Banana IgE: <0.1 kU/L
Allergen Barley IgE: <0.1 kU/L
Allergen Black Pepper IgE: <0.1 kU/L
Allergen Blueberry IgE: <0.1 kU/L
Allergen Broccoli: <0.1 kU/L
Allergen Cabbage IgE: <0.1 kU/L
Allergen Carrot IgE: <0.1 kU/L
Allergen Cauliflower IgE: <0.1 kU/L
Allergen Celery IgE: <0.1 kU/L
Allergen Cinnamon IgE: <0.1 kU/L
Allergen Coconut IgE: <0.1 kU/L
Allergen Corn, IgE: <0.1 kU/L
Allergen Cucumber IgE: <0.1 kU/L
Allergen Garlic IgE: <0.1 kU/L
Allergen Ginger IgE: <0.1 kU/L
Allergen Gluten IgE: <0.1 kU/L
Allergen Grape IgE: <0.1 kU/L
Allergen Grapefruit IgE: <0.1 kU/L
Allergen Green Bean IgE: <0.1 kU/L
Allergen Green Bell Pepper IgE: <0.1 kU/L
Allergen Green Pea IgE: <0.1 kU/L
Allergen Lamb IgE: <0.1 kU/L
Allergen Lettuce IgE: <0.1 kU/L
Allergen Lime IgE: <0.1 kU/L
Allergen Melon IgE: <0.1 kU/L
Allergen Oat IgE: <0.1 kU/L
Allergen Onion IgE: <0.1 kU/L
Allergen Pear IgE: <0.1 kU/L
Allergen Potato, White IgE: <0.1 kU/L
Allergen Rice IgE: <0.1 kU/L
Allergen Salmon IgE: <0.1 kU/L
Allergen Strawberry IgE: <0.1 kU/L
Allergen Sweet Potato IgE: <0.1 kU/L
Allergen Tomato, IgE: <0.1 kU/L
Allergen Turkey IgE: <0.1 kU/L
Allergen Watermelon IgE: <0.1 kU/L
Allergen, Peach f95: <0.1 kU/L
Basil: <0.1 kU/L
Beef IgE: <0.1 kU/L
C074-IgE Gelatin: <0.1 kU/L
Chicken IgE: <0.1 kU/L
Chocolate/Cacao IgE: <0.1 kU/L
Clam IgE: <0.1 kU/L
Codfish IgE: <0.1 kU/L
Coffee: <0.1 kU/L
Cranberry IgE: <0.1 kU/L
Egg White IgE: <0.1 kU/L
F020-IgE Almond: <0.1 kU/L
F023-IgE Crab: <0.1 kU/L
F045-IgE Yeast: <0.1 kU/L
F076-IgE Alpha Lactalbumin: <0.1 kU/L
F077-IgE Beta Lactoglobulin: <0.1 kU/L
F078-IgE Casein: <0.1 kU/L
F080-IgE Lobster: <0.1 kU/L
F081-IgE Cheese, Cheddar Type: <0.1 kU/L
F089-IgE Mustard: <0.1 kU/L
F096-IgE Avocado: <0.1 kU/L
F202-IgE Cashew Nut: <0.1 kU/L
F214-IgE Spinach: <0.1 kU/L
F222-IgE Tea: <0.1 kU/L
F242-IgE Bing Cherry: <0.1 kU/L
F247-IgE Honey: <0.1 kU/L
F261-IgE Asparagus: <0.1 kU/L
F262-IgE Eggplant: <0.1 kU/L
F265-IgE Cumin: <0.1 kU/L
F278-IgE Bayleaf (Laurel): <0.1 kU/L
F279-IgE Chili Pepper: <0.1 kU/L
F283-IgE Oregano: <0.1 kU/L
F300-IgE Goat's Milk: <0.1 kU/L
F342-IgE Olive, Black: <0.1 kU/L
F343-IgE Raspberry: <0.1 kU/L
Hops: <0.1 kU/L
IgE Egg (Yolk): <0.1 kU/L
Kidney Bean IgE: <0.1 kU/L
Lemon: <0.1 kU/L
Lima Bean IgE: <0.1 kU/L
Malt: <0.1 kU/L
Mushroom IgE: <0.1 kU/L
Orange: <0.1 kU/L
Peanut IgE: <0.1 kU/L
Pineapple IgE: <0.1 kU/L
Pork IgE: <0.1 kU/L
Pumpkin IgE: <0.1 kU/L
Red Beet: <0.1 kU/L
Rye IgE: <0.1 kU/L
Scallop IgE: <0.1 kU/L
Sesame Seed IgE: <0.1 kU/L
Shrimp IgE: <0.1 kU/L
Soybean IgE: <0.1 kU/L
Tuna: <0.1 kU/L
Vanilla: <0.1 kU/L
Walnut IgE: <0.1 kU/L
Wheat IgE: <0.1 kU/L
Whey: <0.1 kU/L
White Bean IgE: <0.1 kU/L

## 2019-03-18 ENCOUNTER — Other Ambulatory Visit: Payer: Self-pay | Admitting: Nurse Practitioner

## 2019-03-18 ENCOUNTER — Encounter: Payer: Self-pay | Admitting: Nurse Practitioner

## 2019-03-18 DIAGNOSIS — R21 Rash and other nonspecific skin eruption: Secondary | ICD-10-CM

## 2019-03-25 ENCOUNTER — Encounter: Payer: Self-pay | Admitting: Internal Medicine

## 2019-03-26 NOTE — Telephone Encounter (Signed)
She may need an office visit

## 2019-04-01 ENCOUNTER — Ambulatory Visit: Payer: Self-pay | Admitting: Surgery

## 2019-04-01 NOTE — H&P (Signed)
History of Present Illness Rebecca Nicholson. Rebecca Frigon MD; 04/01/2019 9:05 AM) The patient is a 23 year old female who presents with a complaint of Hidradenitis. PCP Dr. Glendale Chard Referring provider Earnstine Regal, [place] Reason for evaluation - hidradenitis  This is a 23 year old female who is 4 years status post excision of right axillary hidradenitis. The patient had been doing well for a couple of years. However over the last several months she has developed a firm knot in her right axillary soft tissue. This fluctuates in size. It occasionally becomes inflamed. It has never drained. She also has occasional tenderness in her left axilla. At the last visit she had a firm area in the anterior right axilla as well as several very small bumps in her left axilla. None of these appear to be fluctuant. We attempted a course of Keflex. However with in a few days, the patient developed a generalized rash. She stopped taking the antibiotics. The left axilla is completely asymptomatic. The right axilla feels better. There is no drainage noted.   Problem List/Past Medical Rodman Key K. Maddyx Wieck, MD; 04/01/2019 9:05 AM) RIGHT AXILLARY HIDRADENITIS (L73.2)  Past Surgical History Rodman Key K. Henlee Donovan, MD; 04/01/2019 9:05 AM) Tonsillectomy  Diagnostic Studies History Rebecca Nicholson. Arash Karstens, MD; 04/01/2019 9:05 AM) Colonoscopy never Mammogram never  Allergies Rodman Key K. Honestii Marton, MD; 04/01/2019 9:05 AM) No Known Allergies [03/07/2019]: No Known Drug Allergies [04/11/2014]: Keflex *CEPHALOSPORINS* Rash.  Medication History Mammie Lorenzo, LPN; 01/0/2725 3:66 AM) Blisovi 24 Fe (1-20MG -MCG(24) Tablet, Oral) Active. medroxyPROGESTERone Acetate (150MG /ML Susp Pref Syr, Intramuscular) Active. MedroxyPROGESTERone Acetate (150MG /ML Suspension, Intramuscular as direct) Active. Albuterol Sulfate (108 (90 Base)MCG/ACT Aero Pow Br Act, Inhalation prn) Active. Medications Reconciled  Social History Rebecca Nicholson. Josearmando Kuhnert,  MD; 04/01/2019 9:05 AM) Tobacco use Never smoker. No alcohol use No drug use Caffeine use Carbonated beverages, Tea.  Family History Rebecca Nicholson. Talis Iwan, MD; 04/01/2019 9:05 AM) Breast Cancer Family Members In General. Colon Cancer Family Members In General. Diabetes Mellitus Family Members In General, Mother.  Pregnancy / Birth History Rebecca Nicholson. Monicka Cyran, MD; 04/01/2019 9:05 AM) Age at menarche 60 years. Contraceptive History Depo-provera. Para 0 Gravida 0 Irregular periods  Other Problems Rodman Key K. Aymen Widrig, MD; 04/01/2019 9:05 AM) Asthma Heart murmur    Vitals Claiborne Billings Dockery LPN; 44/0/3474 2:59 AM) 04/01/2019 8:49 AM Weight: 251 lb Height: 67in Body Surface Area: 2.23 m Body Mass Index: 39.31 kg/m  Temp.: 97.39F(Thermal Scan)  Pulse: 97 (Regular)  BP: 116/74 (Sitting, Right Arm, Standard)        Physical Exam Rodman Key K. Mistee Soliman MD; 04/01/2019 9:06 AM)  The physical exam findings are as follows: Note:WDWN in nAD Lungs - CTA B CV - RRR Abd - soft, non-tender Right axilla - anterior axilla - incision well-healed; the previous palpable mass has resolved. However, more posteriorly, there are two adjacent firm masses, total diameter about 3 x 2 cm. No induration or erythema. No drainage. Minimally tender Left axilla - no palpable masses    Assessment & Plan Rodman Key K. Makhi Muzquiz MD; 04/01/2019 9:07 AM)  RIGHT AXILLARY HIDRADENITIS (L73.2)  Current Plans Schedule for Surgery - Excision of hidradenitis - right axilla. The surgical procedure has been discussed with the patient. Potential risks, benefits, alternative treatments, and expected outcomes have been explained. All of the patient's questions at this time have been answered. The likelihood of reaching the patient's treatment goal is good. The patient understand the proposed surgical procedure and wishes to proceed. Note:The patient might be developing some hidradenitis or sebaceous cyst  in  her right axilla. Currently there is no sign of infection or fluctuance. However these are both fairly large in size. I will recommend excision of these while they are still uninfected. The patient is in agreement with this plan. We will schedule this at the next available time.  Wilmon Arms. [name], MD, [facility] Surgery  General/ Trauma Surgery   04/01/2019 9:07 AM

## 2019-04-01 NOTE — H&P (View-Only) (Signed)
History of Present Illness Rebecca Nicholson. Cena Bruhn MD; 04/01/2019 9:05 AM) The patient is a 23 year old female who presents with a complaint of Hidradenitis. PCP Dr. Glendale Chard Referring provider Earnstine Regal, [place] Reason for evaluation - hidradenitis  This is a 23 year old female who is 4 years status post excision of right axillary hidradenitis. The patient had been doing well for a couple of years. However over the last several months she has developed a firm knot in her right axillary soft tissue. This fluctuates in size. It occasionally becomes inflamed. It has never drained. She also has occasional tenderness in her left axilla. At the last visit she had a firm area in the anterior right axilla as well as several very small bumps in her left axilla. None of these appear to be fluctuant. We attempted a course of Keflex. However with in a few days, the patient developed a generalized rash. She stopped taking the antibiotics. The left axilla is completely asymptomatic. The right axilla feels better. There is no drainage noted.   Problem List/Past Medical Rodman Key K. Zarin Knupp, MD; 04/01/2019 9:05 AM) RIGHT AXILLARY HIDRADENITIS (L73.2)  Past Surgical History Rodman Key K. Anabeth Chilcott, MD; 04/01/2019 9:05 AM) Tonsillectomy  Diagnostic Studies History Rebecca Nicholson. Kaiyden Simkin, MD; 04/01/2019 9:05 AM) Colonoscopy never Mammogram never  Allergies Rodman Key K. Emaan Gary, MD; 04/01/2019 9:05 AM) No Known Allergies [03/07/2019]: No Known Drug Allergies [04/11/2014]: Keflex *CEPHALOSPORINS* Rash.  Medication History Mammie Lorenzo, LPN; 01/0/2725 3:66 AM) Blisovi 24 Fe (1-20MG -MCG(24) Tablet, Oral) Active. medroxyPROGESTERone Acetate (150MG /ML Susp Pref Syr, Intramuscular) Active. MedroxyPROGESTERone Acetate (150MG /ML Suspension, Intramuscular as direct) Active. Albuterol Sulfate (108 (90 Base)MCG/ACT Aero Pow Br Act, Inhalation prn) Active. Medications Reconciled  Social History Rebecca Nicholson. Dorance Spink,  MD; 04/01/2019 9:05 AM) Tobacco use Never smoker. No alcohol use No drug use Caffeine use Carbonated beverages, Tea.  Family History Rebecca Nicholson. Breyah Akhter, MD; 04/01/2019 9:05 AM) Breast Cancer Family Members In General. Colon Cancer Family Members In General. Diabetes Mellitus Family Members In General, Mother.  Pregnancy / Birth History Rebecca Nicholson. Mohannad Olivero, MD; 04/01/2019 9:05 AM) Age at menarche 60 years. Contraceptive History Depo-provera. Para 0 Gravida 0 Irregular periods  Other Problems Rodman Key K. Amamda Curbow, MD; 04/01/2019 9:05 AM) Asthma Heart murmur    Vitals Claiborne Billings Dockery LPN; 44/0/3474 2:59 AM) 04/01/2019 8:49 AM Weight: 251 lb Height: 67in Body Surface Area: 2.23 m Body Mass Index: 39.31 kg/m  Temp.: 97.39F(Thermal Scan)  Pulse: 97 (Regular)  BP: 116/74 (Sitting, Right Arm, Standard)        Physical Exam Rodman Key K. Arneshia Ade MD; 04/01/2019 9:06 AM)  The physical exam findings are as follows: Note:WDWN in nAD Lungs - CTA B CV - RRR Abd - soft, non-tender Right axilla - anterior axilla - incision well-healed; the previous palpable mass has resolved. However, more posteriorly, there are two adjacent firm masses, total diameter about 3 x 2 cm. No induration or erythema. No drainage. Minimally tender Left axilla - no palpable masses    Assessment & Plan Rodman Key K. Tamicka Shimon MD; 04/01/2019 9:07 AM)  RIGHT AXILLARY HIDRADENITIS (L73.2)  Current Plans Schedule for Surgery - Excision of hidradenitis - right axilla. The surgical procedure has been discussed with the patient. Potential risks, benefits, alternative treatments, and expected outcomes have been explained. All of the patient's questions at this time have been answered. The likelihood of reaching the patient's treatment goal is good. The patient understand the proposed surgical procedure and wishes to proceed. Note:The patient might be developing some hidradenitis or sebaceous cyst  in  her right axilla. Currently there is no sign of infection or fluctuance. However these are both fairly large in size. I will recommend excision of these while they are still uninfected. The patient is in agreement with this plan. We will schedule this at the next available time.  Azarie Coriz K. Rosea Dory, MD, FACS [facility]  General/ Trauma Surgery   04/01/2019 9:07 AM   

## 2019-04-03 ENCOUNTER — Other Ambulatory Visit: Payer: Self-pay

## 2019-04-03 ENCOUNTER — Encounter (HOSPITAL_BASED_OUTPATIENT_CLINIC_OR_DEPARTMENT_OTHER): Payer: Self-pay | Admitting: *Deleted

## 2019-04-08 ENCOUNTER — Other Ambulatory Visit (HOSPITAL_COMMUNITY)
Admission: RE | Admit: 2019-04-08 | Discharge: 2019-04-08 | Disposition: A | Discharge status: Home / Self Care | Payer: 59 | Source: Ambulatory Visit | Attending: Surgery | Admitting: Surgery

## 2019-04-08 DIAGNOSIS — Z01812 Encounter for preprocedural laboratory examination: Secondary | ICD-10-CM | POA: Insufficient documentation

## 2019-04-08 DIAGNOSIS — Z20828 Contact with and (suspected) exposure to other viral communicable diseases: Secondary | ICD-10-CM | POA: Insufficient documentation

## 2019-04-09 LAB — NOVEL CORONAVIRUS, NAA (HOSP ORDER, SEND-OUT TO REF LAB; TAT 18-24 HRS): SARS-CoV-2, NAA: NOT DETECTED

## 2019-04-11 ENCOUNTER — Ambulatory Visit (HOSPITAL_BASED_OUTPATIENT_CLINIC_OR_DEPARTMENT_OTHER): Payer: 59 | Admitting: Certified Registered"

## 2019-04-11 ENCOUNTER — Ambulatory Visit (HOSPITAL_BASED_OUTPATIENT_CLINIC_OR_DEPARTMENT_OTHER)
Admission: RE | Admit: 2019-04-11 | Discharge: 2019-04-11 | Disposition: A | Discharge status: Home / Self Care | Payer: 59 | Attending: Surgery | Admitting: Surgery

## 2019-04-11 ENCOUNTER — Other Ambulatory Visit: Payer: Self-pay

## 2019-04-11 ENCOUNTER — Encounter (HOSPITAL_BASED_OUTPATIENT_CLINIC_OR_DEPARTMENT_OTHER)
Admission: RE | Disposition: A | Discharge status: Home / Self Care | Payer: Self-pay | Source: Home / Self Care | Attending: Surgery

## 2019-04-11 ENCOUNTER — Encounter (HOSPITAL_BASED_OUTPATIENT_CLINIC_OR_DEPARTMENT_OTHER): Payer: Self-pay

## 2019-04-11 DIAGNOSIS — Z888 Allergy status to other drugs, medicaments and biological substances status: Secondary | ICD-10-CM | POA: Diagnosis not present

## 2019-04-11 DIAGNOSIS — L732 Hidradenitis suppurativa: Secondary | ICD-10-CM | POA: Insufficient documentation

## 2019-04-11 DIAGNOSIS — J4540 Moderate persistent asthma, uncomplicated: Secondary | ICD-10-CM | POA: Diagnosis not present

## 2019-04-11 DIAGNOSIS — R011 Cardiac murmur, unspecified: Secondary | ICD-10-CM | POA: Diagnosis not present

## 2019-04-11 DIAGNOSIS — Z79899 Other long term (current) drug therapy: Secondary | ICD-10-CM | POA: Insufficient documentation

## 2019-04-11 DIAGNOSIS — Z6838 Body mass index (BMI) 38.0-38.9, adult: Secondary | ICD-10-CM | POA: Insufficient documentation

## 2019-04-11 HISTORY — PX: HYDRADENITIS EXCISION: SHX5243

## 2019-04-11 LAB — POCT PREGNANCY, URINE: Preg Test, Ur: NEGATIVE

## 2019-04-11 SURGERY — EXCISION, HIDRADENITIS, AXILLA
Anesthesia: General | Site: Axilla | Laterality: Right

## 2019-04-11 MED ORDER — PROPOFOL 10 MG/ML IV BOLUS
INTRAVENOUS | Status: AC
  Filled 2019-04-11: qty 60

## 2019-04-11 MED ORDER — HYDROMORPHONE HCL 1 MG/ML IJ SOLN: 0.2500 mg | INTRAMUSCULAR | Status: DC | PRN

## 2019-04-11 MED ORDER — OXYCODONE HCL 5 MG/5ML PO SOLN: 5.0000 mg | Freq: Once | ORAL | Status: DC | PRN

## 2019-04-11 MED ORDER — MEPERIDINE HCL 25 MG/ML IJ SOLN: 6.2500 mg | INTRAMUSCULAR | Status: DC | PRN

## 2019-04-11 MED ORDER — KETOROLAC TROMETHAMINE 30 MG/ML IJ SOLN: 30.0000 mg | Freq: Once | INTRAMUSCULAR | Status: DC | PRN

## 2019-04-11 MED ORDER — PROPOFOL 500 MG/50ML IV EMUL
INTRAVENOUS | Status: AC
  Filled 2019-04-11: qty 50

## 2019-04-11 MED ORDER — ACETAMINOPHEN 500 MG PO TABS
ORAL_TABLET | ORAL | Status: AC
  Filled 2019-04-11: qty 2

## 2019-04-11 MED ORDER — KETOROLAC TROMETHAMINE 30 MG/ML IJ SOLN: INTRAMUSCULAR | Status: DC | PRN

## 2019-04-11 MED ORDER — FENTANYL CITRATE (PF) 100 MCG/2ML IJ SOLN
INTRAMUSCULAR | Status: DC | PRN
  Administered 2019-04-11 (×4): 25 ug via INTRAVENOUS

## 2019-04-11 MED ORDER — BUPIVACAINE HCL (PF) 0.25 % IJ SOLN
INTRAMUSCULAR | Status: DC | PRN
  Administered 2019-04-11: 10 mL

## 2019-04-11 MED ORDER — CHLORHEXIDINE GLUCONATE CLOTH 2 % EX PADS: 6.0000 | MEDICATED_PAD | Freq: Once | CUTANEOUS | Status: DC

## 2019-04-11 MED ORDER — OXYCODONE HCL 5 MG PO TABS: 5.0000 mg | ORAL_TABLET | Freq: Once | ORAL | Status: DC | PRN

## 2019-04-11 MED ORDER — MIDAZOLAM HCL 5 MG/5ML IJ SOLN
INTRAMUSCULAR | Status: DC | PRN
  Administered 2019-04-11: 2 mg via INTRAVENOUS

## 2019-04-11 MED ORDER — MIDAZOLAM HCL 2 MG/2ML IJ SOLN
INTRAMUSCULAR | Status: AC
  Filled 2019-04-11: qty 2

## 2019-04-11 MED ORDER — ACETAMINOPHEN 500 MG PO TABS
1000.0000 mg | ORAL_TABLET | Freq: Once | ORAL | Status: AC
  Administered 2019-04-11: 13:00:00 500 mg via ORAL

## 2019-04-11 MED ORDER — ONDANSETRON HCL 4 MG/2ML IJ SOLN
INTRAMUSCULAR | Status: DC | PRN
  Administered 2019-04-11: 4 mg via INTRAVENOUS

## 2019-04-11 MED ORDER — PROPOFOL 10 MG/ML IV BOLUS
INTRAVENOUS | Status: DC | PRN
  Administered 2019-04-11: 100 mg via INTRAVENOUS
  Administered 2019-04-11: 30 mg via INTRAVENOUS
  Administered 2019-04-11: 70 mg via INTRAVENOUS
  Administered 2019-04-11: 200 mg via INTRAVENOUS

## 2019-04-11 MED ORDER — DEXMEDETOMIDINE HCL IN NACL 200 MCG/50ML IV SOLN
INTRAVENOUS | Status: DC | PRN
  Administered 2019-04-11: 8 ug via INTRAVENOUS
  Administered 2019-04-11: 4 ug via INTRAVENOUS

## 2019-04-11 MED ORDER — PROMETHAZINE HCL 25 MG/ML IJ SOLN: 6.2500 mg | INTRAMUSCULAR | Status: DC | PRN

## 2019-04-11 MED ORDER — DEXAMETHASONE SODIUM PHOSPHATE 10 MG/ML IJ SOLN
INTRAMUSCULAR | Status: DC | PRN
  Administered 2019-04-11: 5 mg via INTRAVENOUS

## 2019-04-11 MED ORDER — VANCOMYCIN HCL IN DEXTROSE 1-5 GM/200ML-% IV SOLN
1000.0000 mg | INTRAVENOUS | Status: AC
  Administered 2019-04-11: 1000 mg via INTRAVENOUS

## 2019-04-11 MED ORDER — FENTANYL CITRATE (PF) 100 MCG/2ML IJ SOLN
INTRAMUSCULAR | Status: AC
  Filled 2019-04-11: qty 2

## 2019-04-11 MED ORDER — LACTATED RINGERS IV SOLN
INTRAVENOUS | Status: DC
  Administered 2019-04-11: 13:00:00 via INTRAVENOUS

## 2019-04-11 MED ORDER — OXYCODONE HCL 5 MG PO TABS: 5.0000 mg | ORAL_TABLET | Freq: Four times a day (QID) | ORAL | 0 refills | Status: DC | PRN

## 2019-04-11 MED ORDER — LIDOCAINE HCL (CARDIAC) PF 100 MG/5ML IV SOSY
PREFILLED_SYRINGE | INTRAVENOUS | Status: DC | PRN
  Administered 2019-04-11: 40 mg via INTRAVENOUS

## 2019-04-11 MED ORDER — VANCOMYCIN HCL IN DEXTROSE 1-5 GM/200ML-% IV SOLN
INTRAVENOUS | Status: AC
  Filled 2019-04-11: qty 200

## 2019-04-11 SURGICAL SUPPLY — 43 items
BENZOIN TINCTURE PRP APPL 2/3 (GAUZE/BANDAGES/DRESSINGS) ×3 IMPLANT
BLADE HEX COATED 2.75 (ELECTRODE) ×3 IMPLANT
BLADE SURG 15 STRL LF DISP TIS (BLADE) ×1 IMPLANT
BLADE SURG 15 STRL SS (BLADE) ×2
CANISTER SUCT 1200ML W/VALVE (MISCELLANEOUS) ×3 IMPLANT
CHLORAPREP W/TINT 26 (MISCELLANEOUS) ×3 IMPLANT
CLOSURE WOUND 1/2 X4 (GAUZE/BANDAGES/DRESSINGS) ×1
COVER BACK TABLE REUSABLE LG (DRAPES) ×3 IMPLANT
COVER MAYO STAND REUSABLE (DRAPES) ×3 IMPLANT
COVER WAND RF STERILE (DRAPES) IMPLANT
DECANTER SPIKE VIAL GLASS SM (MISCELLANEOUS) ×3 IMPLANT
DRAPE LAPAROTOMY 100X72 PEDS (DRAPES) ×3 IMPLANT
DRAPE UTILITY XL STRL (DRAPES) ×3 IMPLANT
DRSG TEGADERM 4X4.75 (GAUZE/BANDAGES/DRESSINGS) ×3 IMPLANT
ELECT REM PT RETURN 9FT ADLT (ELECTROSURGICAL) ×3
ELECTRODE REM PT RTRN 9FT ADLT (ELECTROSURGICAL) ×1 IMPLANT
GAUZE SPONGE 4X4 12PLY STRL LF (GAUZE/BANDAGES/DRESSINGS) ×3 IMPLANT
GAUZE XEROFORM 1X8 LF (GAUZE/BANDAGES/DRESSINGS) IMPLANT
GLOVE BIO SURGEON STRL SZ7 (GLOVE) ×3 IMPLANT
GLOVE BIOGEL PI IND STRL 7.0 (GLOVE) ×1 IMPLANT
GLOVE BIOGEL PI IND STRL 7.5 (GLOVE) ×2 IMPLANT
GLOVE BIOGEL PI INDICATOR 7.0 (GLOVE) ×2
GLOVE BIOGEL PI INDICATOR 7.5 (GLOVE) ×4
GLOVE ECLIPSE 7.0 STRL STRAW (GLOVE) ×3 IMPLANT
GOWN STRL REUS W/ TWL LRG LVL3 (GOWN DISPOSABLE) ×2 IMPLANT
GOWN STRL REUS W/TWL LRG LVL3 (GOWN DISPOSABLE) ×4
NEEDLE HYPO 25X1 1.5 SAFETY (NEEDLE) ×3 IMPLANT
NS IRRIG 1000ML POUR BTL (IV SOLUTION) ×3 IMPLANT
PACK BASIN DAY SURGERY FS (CUSTOM PROCEDURE TRAY) ×3 IMPLANT
PENCIL SMOKE EVACUATOR (MISCELLANEOUS) ×3 IMPLANT
SLEEVE SCD COMPRESS KNEE MED (MISCELLANEOUS) ×3 IMPLANT
SPONGE LAP 4X18 RFD (DISPOSABLE) ×3 IMPLANT
STRIP CLOSURE SKIN 1/2X4 (GAUZE/BANDAGES/DRESSINGS) ×2 IMPLANT
SUT ETHILON 3 0 PS 1 (SUTURE) IMPLANT
SUT MON AB 4-0 PC3 18 (SUTURE) ×3 IMPLANT
SUT VIC AB 3-0 SH 27 (SUTURE) ×2
SUT VIC AB 3-0 SH 27X BRD (SUTURE) ×1 IMPLANT
SYR BULB 3OZ (MISCELLANEOUS) ×3 IMPLANT
SYR CONTROL 10ML LL (SYRINGE) ×3 IMPLANT
TOWEL GREEN STERILE FF (TOWEL DISPOSABLE) ×3 IMPLANT
TUBE CONNECTING 20'X1/4 (TUBING) ×1
TUBE CONNECTING 20X1/4 (TUBING) ×2 IMPLANT
YANKAUER SUCT BULB TIP NO VENT (SUCTIONS) ×3 IMPLANT

## 2019-04-11 NOTE — Interval H&P Note (Signed)
History and Physical Interval Note:  04/11/2019 1:40 PM  Rebecca Nicholson  has presented today for surgery, with the diagnosis of right axillary hidradenitis.  The various methods of treatment have been discussed with the patient and family. After consideration of risks, benefits and other options for treatment, the patient has consented to  Procedure(s) with comments: EXCISION RIGHT AXILLARY HIDRADENITIS (Right) - LMA as a surgical intervention.  The patient's history has been reviewed, patient examined, no change in status, stable for surgery.  I have reviewed the patient's chart and labs.  Questions were answered to the patient's satisfaction.     Maia Petties

## 2019-04-11 NOTE — Anesthesia Postprocedure Evaluation (Signed)
Anesthesia Post Note  Patient: Rebecca Nicholson  Procedure(s) Performed: EXCISION RIGHT AXILLARY HIDRADENITIS (Right Axilla)     Patient location during evaluation: PACU Anesthesia Type: General Level of consciousness: awake and alert, oriented and patient cooperative Pain management: pain level controlled Vital Signs Assessment: post-procedure vital signs reviewed and stable Respiratory status: spontaneous breathing, nonlabored ventilation and respiratory function stable Cardiovascular status: blood pressure returned to baseline and stable Postop Assessment: no apparent nausea or vomiting Anesthetic complications: no    Last Vitals:  Vitals:   04/11/19 1530 04/11/19 1539  BP: 131/81 132/77  Pulse: 83 76  Resp: 19 19  Temp:    SpO2: 100% 99%    Last Pain:  Vitals:   04/11/19 1515  TempSrc:   PainSc: [place]

## 2019-04-11 NOTE — Discharge Instructions (Signed)
[facility] °Office Phone Number [phone] ° ° POST OP INSTRUCTIONS ° °Always review your discharge instruction sheet given to you by the facility where your surgery was performed. ° °IF YOU HAVE DISABILITY OR FAMILY LEAVE FORMS, YOU MUST BRING THEM TO THE OFFICE FOR PROCESSING.  DO NOT GIVE THEM TO YOUR DOCTOR. ° °1. A prescription for pain medication may be given to you upon discharge.  Take your pain medication as prescribed, if needed.  If narcotic pain medicine is not needed, then you may take acetaminophen (Tylenol) or ibuprofen (Advil) as needed. °2. Take your usually prescribed medications unless otherwise directed °3. If you need a refill on your pain medication, please contact your pharmacy.  They will contact our office to request authorization.  Prescriptions will not be filled after 5pm or on week-ends. °4. You should eat very light the first 24 hours after surgery, such as soup, crackers, pudding, etc.  Resume your normal diet the day after surgery. °5. Most patients will experience some swelling and bruising around the surgical site.  Ice packs will help.  Swelling and bruising can take several days to resolve.  °6. It is common to experience some constipation if taking pain medication after surgery.  Increasing fluid intake and taking a stool softener will usually help or prevent this problem from occurring.  A mild laxative (Milk of Magnesia or Miralax) should be taken according to package directions if there are no bowel movements after 48 hours. °7. You may remove your bandages 48 hours after surgery, and you may shower at that time.  You will have steri-strips (small skin tapes) in place directly over the incision.  These strips should be left on the skin for 7-10 days.   °8. ACTIVITIES:  You may resume regular daily activities (gradually increasing) beginning the next day.   You may have sexual intercourse when it is comfortable. °a. You may drive when you no longer are taking  prescription pain medication, you can comfortably wear a seatbelt, and you can safely maneuver your car and apply brakes. °b. RETURN TO WORK:  1-2 weeks °9. You should see your doctor in the office for a follow-up appointment approximately two to three weeks after your surgery.   ° °WHEN TO CALL YOUR DOCTOR: °1. Fever over 101.0 °2. Nausea and/or vomiting. °3. Extreme swelling or bruising. °4. Continued bleeding from incision. °5. Increased pain, redness, or drainage from the incision. ° °The clinic staff is available to answer your questions during regular business hours.  Please don’t hesitate to call and ask to speak to one of the nurses for clinical concerns.  If you have a medical emergency, go to the nearest emergency room or call 911.  A surgeon from [facility] is always on call at the hospital. ° °For further questions, please visit [email or web address]  ° ° °Post Anesthesia Home Care Instructions ° °Activity: °Get plenty of rest for the remainder of the day. A responsible individual must stay with you for 24 hours following the procedure.  °For the next 24 hours, DO NOT: °-Drive a car °-Operate machinery °-Drink alcoholic beverages °-Take any medication unless instructed by your physician °-Make any legal decisions or sign important papers. ° °Meals: °Start with liquid foods such as gelatin or soup. Progress to regular foods as tolerated. Avoid greasy, spicy, heavy foods. If nausea and/or vomiting occur, drink only clear liquids until the nausea and/or vomiting subsides. Call your physician if vomiting continues. ° °Special Instructions/Symptoms: °Your throat   may feel dry or sore from the anesthesia or the breathing tube placed in your throat during surgery. If this causes discomfort, gargle with warm salt water. The discomfort should disappear within 24 hours. ° °If you had a scopolamine patch placed behind your ear for the management of post- operative nausea and/or vomiting: ° °1. The  medication in the patch is effective for 72 hours, after which it should be removed.  Wrap patch in a tissue and discard in the trash. Wash hands thoroughly with soap and water. °2. You may remove the patch earlier than 72 hours if you experience unpleasant side effects which may include dry mouth, dizziness or visual disturbances. °3. Avoid touching the patch. Wash your hands with soap and water after contact with the patch. °   ° ° ° ° °

## 2019-04-11 NOTE — Anesthesia Preprocedure Evaluation (Signed)
Anesthesia Evaluation  Patient identified by MRN, date of birth, ID band Patient awake    Reviewed: Allergy & Precautions, NPO status , Patient's Chart, lab work & pertinent test results  Airway Mallampati: I  TM Distance: >3 FB Neck ROM: Full    Dental  (+) Teeth Intact, Dental Advisory Given   Pulmonary asthma ,  Childhood asthma   breath sounds clear to auscultation       Cardiovascular negative cardio ROS   Rhythm:Regular Rate:Normal     Neuro/Psych negative neurological ROS     GI/Hepatic negative GI ROS, Neg liver ROS,   Endo/Other  Morbid obesity  Renal/GU negative Renal ROS  negative genitourinary   Musculoskeletal negative musculoskeletal ROS (+)   Abdominal   Peds  Hematology  (+) anemia ,   Anesthesia Other Findings Suppurative hydradenitis right axilla  Reproductive/Obstetrics negative OB ROS                             Anesthesia Physical  Anesthesia Plan  ASA: II  Anesthesia Plan: General   Post-op Pain Management:    Induction: Intravenous  PONV Risk Score and Plan: 2 and Ondansetron, Dexamethasone, Midazolam and Treatment may vary due to age or medical condition  Airway Management Planned: LMA  Additional Equipment: None  Intra-op Plan:   Post-operative Plan: Extubation in OR  Informed Consent: I have reviewed the patients History and Physical, chart, labs and discussed the procedure including the risks, benefits and alternatives for the proposed anesthesia with the patient or authorized representative who has indicated his/her understanding and acceptance.     Dental advisory given  Plan Discussed with: CRNA  Anesthesia Plan Comments:         Anesthesia Quick Evaluation

## 2019-04-11 NOTE — Anesthesia Procedure Notes (Signed)
Procedure Name: LMA Insertion Date/Time: 04/11/2019 2:17 PM Performed by: Lavonia Dana, CRNA Pre-anesthesia Checklist: Patient identified, Emergency Drugs available, Suction available and Patient being monitored Patient Re-evaluated:Patient Re-evaluated prior to induction Oxygen Delivery Method: Circle system utilized Preoxygenation: Pre-oxygenation with 100% oxygen Induction Type: IV induction Ventilation: Mask ventilation without difficulty LMA: LMA inserted LMA Size: 4.0 Number of attempts: 1 Airway Equipment and Method: Bite block Placement Confirmation: positive ETCO2 Tube secured with: Tape Dental Injury: Teeth and Oropharynx as per pre-operative assessment

## 2019-04-11 NOTE — Transfer of Care (Signed)
Immediate Anesthesia Transfer of Care Note  Patient: Rebecca Nicholson  Procedure(s) Performed: EXCISION RIGHT AXILLARY HIDRADENITIS (Right Axilla)  Patient Location: PACU  Anesthesia Type:General  Level of Consciousness: awake, alert  and oriented  Airway & Oxygen Therapy: Patient Spontanous Breathing and Patient connected to face mask oxygen  Post-op Assessment: Report given to RN and Post -op Vital signs reviewed and stable  Post vital signs: Reviewed and stable  Last Vitals:  Vitals Value Taken Time  BP 132/82 04/11/19 1502  Temp    Pulse 90 04/11/19 1507  Resp 21 04/11/19 1507  SpO2 100 % 04/11/19 1507  Vitals shown include unvalidated device data.  Last Pain:  Vitals:   04/11/19 1255  TempSrc: Oral  PainSc: 0-No pain         Complications: No apparent anesthesia complications

## 2019-04-11 NOTE — Op Note (Signed)
Preop diagnosis: Right axillary hidradenitis Postop diagnosis: Same Procedure performed: Excision of right axillary hidradenitis Surgeon:Kuzey Ogata K Koehn Salehi Anesthesia: General via LMA Indications:This is a 23 year old female who is 4 years status post excision of right axillary hidradenitis. The patient had been doing well for a couple of years. However over the last several months she has developed a firm knot in her right axillary soft tissue. This fluctuates in size. It occasionally becomes inflamed. It has never drained. She also has occasional tenderness in her left axilla. At the last visit she had a firm area in the anterior right axilla as well as several very small bumps in her left axilla. None of these appear to be fluctuant. We attempted a course of Keflex. The left axilla is completely asymptomatic.  She improved somewhat on antibiotics but now has developed to fairly large firm masses in the posterior axilla.  I feel that these represent recurrent hidradenitis.  I recommended excision before it becomes infected.  Description of procedure: The patient is brought to the operating room and placed in the supine position on the operating room table.  After an adequate level of general anesthesia was obtained, her right axilla was prepped with ChloraPrep and draped in sterile fashion.  A timeout was taken.  The previous anterior axillary incision is completely healed with no sign of infection.  2 firm palpable masses each about a centimeter in size are palpated in the superficial subcutaneous tissue posteriorly.  I made an elliptical incision over this area after infiltrating with local anesthetic.  We excised some of the skin as well as the palpable masses and some surrounding subcutaneous tissue.  This was sent for pathologic examination.  We irrigated the wound thoroughly and inspected for hemostasis.  The wound was closed with a deep layer of 3-0 Vicryl and a subcuticular layer 4-0  Monocryl.  Benzoin and Steri-Strips were applied.  The patient was then extubated and brought to the recovery room stable condition.  All sponge, instrument, and needle counts are correct.  Imogene Burn. Georgette Dover, MD, [facility]  General/ Trauma Surgery   04/11/2019 2:47 PM

## 2019-04-12 ENCOUNTER — Encounter: Payer: Self-pay | Admitting: Internal Medicine

## 2019-04-12 ENCOUNTER — Encounter (HOSPITAL_BASED_OUTPATIENT_CLINIC_OR_DEPARTMENT_OTHER): Payer: Self-pay | Admitting: Surgery

## 2019-04-12 LAB — SURGICAL PATHOLOGY

## 2019-04-16 ENCOUNTER — Telehealth: Payer: Self-pay

## 2019-04-16 NOTE — Telephone Encounter (Signed)
I called patient to notify her that [facility] has been trying to contact her to make an appointment. Their number is [phone] I was unable to leave her a v/m so I will send her a message through mychart. YRL,RMA

## 2019-05-14 ENCOUNTER — Other Ambulatory Visit: Payer: Self-pay

## 2019-05-14 ENCOUNTER — Encounter: Payer: Self-pay | Admitting: Internal Medicine

## 2019-05-14 ENCOUNTER — Ambulatory Visit: Payer: 59 | Admitting: Internal Medicine

## 2019-05-14 VITALS — BP 118/76 | HR 78 | Temp 98.4°F | Ht 67.0 in | Wt 250.4 lb

## 2019-05-14 DIAGNOSIS — L732 Hidradenitis suppurativa: Secondary | ICD-10-CM

## 2019-05-14 DIAGNOSIS — Z23 Encounter for immunization: Secondary | ICD-10-CM | POA: Diagnosis not present

## 2019-05-14 DIAGNOSIS — E6609 Other obesity due to excess calories: Secondary | ICD-10-CM

## 2019-05-14 DIAGNOSIS — D696 Thrombocytopenia, unspecified: Secondary | ICD-10-CM | POA: Insufficient documentation

## 2019-05-14 DIAGNOSIS — Z6839 Body mass index (BMI) 39.0-39.9, adult: Secondary | ICD-10-CM

## 2019-05-14 DIAGNOSIS — Z0000 Encounter for general adult medical examination without abnormal findings: Secondary | ICD-10-CM

## 2019-05-14 LAB — POCT URINALYSIS DIPSTICK
Bilirubin, UA: NEGATIVE
Glucose, UA: NEGATIVE
Ketones, UA: NEGATIVE
Leukocytes, UA: NEGATIVE
Nitrite, UA: NEGATIVE
Protein, UA: NEGATIVE
Spec Grav, UA: 1.025 (ref 1.010–1.025)
Urobilinogen, UA: 0.2 E.U./dL
pH, UA: 6.5 (ref 5.0–8.0)

## 2019-05-14 NOTE — Progress Notes (Signed)
This visit occurred during the SARS-CoV-2 public health emergency.  Safety protocols were in place, including screening questions prior to the visit, additional usage of staff PPE, and extensive cleaning of exam room while observing appropriate contact time as indicated for disinfecting solutions.  Subjective:     Patient ID: Rebecca Nicholson , female    DOB: 1995-06-13 , 23 y.o.   MRN: [id number]   Chief Complaint  Patient presents with  . Annual Exam    HPI  She is here today for a full physical examination. She is followed by Earnstine Regal, PA for her GYN exams. She was last seen Feb 2020 and a pap smear was performed at that time. She has no specific concerns or complaints at this time.    Past Medical History:  Diagnosis Date  . Anemia   . Asthma    as child  . Hidradenitis suppurativa of right axilla   . Medical history non-contributory   . Moderate persistent asthma without complication 72/62/0355     Family History  Problem Relation Age of Onset  . Hypertension Mother   . Diabetes Mother   . Diabetes Maternal Aunt   . Colon cancer Maternal Grandmother   . Breast cancer Maternal Grandmother   . Cancer Father   . Esophageal cancer Neg Hx   . Rectal cancer Neg Hx   . Stomach cancer Neg Hx      Current Outpatient Medications:  .  albuterol (VENTOLIN HFA) 108 (90 Base) MCG/ACT inhaler, Inhale 2 puffs into the lungs every 6 (six) hours as needed for wheezing., Disp: 1 Inhaler, Rfl: 11 .  budesonide-formoterol (SYMBICORT) 80-4.5 MCG/ACT inhaler, Inhale 2 puffs into the lungs 2 (two) times daily., Disp: 1 Inhaler, Rfl: 11 .  norethindrone-ethinyl estradiol (FEMHRT 1/5) 1-5 MG-MCG TABS tablet, Take by mouth daily., Disp: , Rfl:  .  VENTOLIN HFA 108 (90 Base) MCG/ACT inhaler, Inhale 2 puffs into the lungs every 6 (six) hours as needed for wheezing or shortness of breath., Disp: , Rfl:    Allergies  Allergen Reactions  . Cephalosporins Rash     The patient states  she uses OCP (estrogen/progesterone) for birth control. Last LMP was Patient's last menstrual period was 05/09/2019 (exact date).. Negative for Dysmenorrhea Negative for: breast discharge, breast lump(s), breast pain and breast self exam. Associated symptoms include abnormal vaginal bleeding. Pertinent negatives include abnormal bleeding (hematology), anxiety, decreased libido, depression, difficulty falling sleep, dyspareunia, history of infertility, nocturia, sexual dysfunction, sleep disturbances, urinary incontinence, urinary urgency, vaginal discharge and vaginal itching. Diet regular.The patient states her exercise level is  intermittent.  . The patient's tobacco use is:  Social History   Tobacco Use  Smoking Status Never Smoker  Smokeless Tobacco Never Used  . She has been exposed to passive smoke. The patient's alcohol use is:  Social History   Substance and Sexual Activity  Alcohol Use No    Review of Systems  Constitutional: Negative.   HENT: Negative.   Eyes: Negative.   Respiratory: Negative.   Cardiovascular: Negative.   Endocrine: Negative.   Genitourinary: Negative.   Musculoskeletal: Negative.   Skin: Negative.   Allergic/Immunologic: Negative.   Neurological: Negative.   Hematological: Negative.   Psychiatric/Behavioral: Negative.      Today's Vitals   05/14/19 0847  BP: 118/76  Pulse: 78  Temp: 98.4 F (36.9 C)  TempSrc: Oral  Weight: 250 lb 6.4 oz (113.6 kg)  Height: '5\' 7"'  (1.702 m)   Body mass index  is 39.22 kg/m.   Objective:  Physical Exam Vitals and nursing note reviewed.  Constitutional:      Appearance: Normal appearance. She is obese.  HENT:     Head: Normocephalic and atraumatic.     Right Ear: Tympanic membrane, ear canal and external ear normal.     Left Ear: Tympanic membrane, ear canal and external ear normal.     Nose: Nose normal.     Mouth/Throat:     Mouth: Mucous membranes are moist.     Pharynx: Oropharynx is clear.  Eyes:      Extraocular Movements: Extraocular movements intact.     Conjunctiva/sclera: Conjunctivae normal.     Pupils: Pupils are equal, round, and reactive to light.  Cardiovascular:     Rate and Rhythm: Normal rate and regular rhythm.     Pulses: Normal pulses.     Heart sounds: Normal heart sounds.  Pulmonary:     Effort: Pulmonary effort is normal.     Breath sounds: Normal breath sounds.  Chest:     Breasts: Tanner Score is 5.        Right: Normal.        Left: Normal.     Comments: Healed surgical scars R axilla.  Left axilla tender to touch, tender mass palpated. No overlying erythema.  Abdominal:     General: Bowel sounds are normal.     Palpations: Abdomen is soft.     Comments: Soft, rounded.   Genitourinary:    Comments: deferred Musculoskeletal:        General: Normal range of motion.     Cervical back: Normal range of motion and neck supple.  Skin:    General: Skin is warm and dry.  Neurological:     General: No focal deficit present.     Mental Status: She is alert and oriented to person, place, and time.  Psychiatric:        Mood and Affect: Mood normal.        Behavior: Behavior normal.         Assessment And Plan:     1. Routine general medical examination at health care facility  A full exam was performed.  Importance of monthly self breast exams was discussed with the patient. PATIENT HAS BEEN ADVISED TO GET 30-45 MINUTES REGULAR EXERCISE NO LESS THAN FOUR TO FIVE DAYS PER WEEK - BOTH WEIGHTBEARING EXERCISES AND AEROBIC ARE RECOMMENDED.  SHE IS ADVISED TO FOLLOW A HEALTHY DIET WITH AT LEAST SIX FRUITS/VEGGIES PER DAY, DECREASE INTAKE OF RED MEAT, AND TO INCREASE FISH INTAKE TO TWO DAYS PER WEEK.  MEATS/FISH SHOULD NOT BE FRIED, BAKED OR BROILED IS PREFERABLE.  I SUGGEST WEARING SPF 50 SUNSCREEN ON EXPOSED PARTS AND ESPECIALLY WHEN IN THE DIRECT SUNLIGHT FOR AN EXTENDED PERIOD OF TIME.  PLEASE AVOID FAST FOOD RESTAURANTS AND INCREASE YOUR WATER INTAKE.  - POCT  Urinalysis Dipstick (81002) - CMP14+EGFR - CBC - Lipid panel - Hemoglobin A1c  2. Hidradenitis suppurativa  Chronic. She plans to consult with her surgeon in the near future. She does not wish to take abx b/c she had an allergic reaction to her last rx.   3. Need for influenza vaccination  - Flu Vaccine QUAD 6+ mos PF IM (Fluarix Quad PF)   4. Class 2 obesity due to excess calories without serious comorbidity with body mass index (BMI) of 39.0 to 39.9 in adult  We discussed the importance of regular exercise as part of a  healthy lifestyle. She is encouraged to exercise at least 150 minutes per week.    Maximino Greenland, MD    THE PATIENT IS ENCOURAGED TO PRACTICE SOCIAL DISTANCING DUE TO THE COVID-19 PANDEMIC.

## 2019-05-14 NOTE — Patient Instructions (Signed)
Health Maintenance, Female Adopting a healthy lifestyle and getting preventive care are important in promoting health and wellness. Ask your health care provider about:  The right schedule for you to have regular tests and exams.  Things you can do on your own to prevent diseases and keep yourself healthy. What should I know about diet, weight, and exercise? Eat a healthy diet   Eat a diet that includes plenty of vegetables, fruits, low-fat dairy products, and lean protein.  Do not eat a lot of foods that are high in solid fats, added sugars, or sodium. Maintain a healthy weight Body mass index (BMI) is used to identify weight problems. It estimates body fat based on height and weight. Your health care provider can help determine your BMI and help you achieve or maintain a healthy weight. Get regular exercise Get regular exercise. This is one of the most important things you can do for your health. Most adults should:  Exercise for at least 150 minutes each week. The exercise should increase your heart rate and make you sweat (moderate-intensity exercise).  Do strengthening exercises at least twice a week. This is in addition to the moderate-intensity exercise.  Spend less time sitting. Even light physical activity can be beneficial. Watch cholesterol and blood lipids Have your blood tested for lipids and cholesterol at 24 years of age, then have this test every 5 years. Have your cholesterol levels checked more often if:  Your lipid or cholesterol levels are high.  You are older than 24 years of age.  You are at high risk for heart disease. What should I know about cancer screening? Depending on your health history and family history, you may need to have cancer screening at various ages. This may include screening for:  Breast cancer.  Cervical cancer.  Colorectal cancer.  Skin cancer.  Lung cancer. What should I know about heart disease, diabetes, and high blood  pressure? Blood pressure and heart disease  High blood pressure causes heart disease and increases the risk of stroke. This is more likely to develop in people who have high blood pressure readings, are of African descent, or are overweight.  Have your blood pressure checked: ? Every 3-5 years if you are 18-39 years of age. ? Every year if you are 40 years old or older. Diabetes Have regular diabetes screenings. This checks your fasting blood sugar level. Have the screening done:  Once every three years after age 40 if you are at a normal weight and have a low risk for diabetes.  More often and at a younger age if you are overweight or have a high risk for diabetes. What should I know about preventing infection? Hepatitis B If you have a higher risk for hepatitis B, you should be screened for this virus. Talk with your health care provider to find out if you are at risk for hepatitis B infection. Hepatitis C Testing is recommended for:  Everyone born from 1945 through 1965.  Anyone with known risk factors for hepatitis C. Sexually transmitted infections (STIs)  Get screened for STIs, including gonorrhea and chlamydia, if: ? You are sexually active and are younger than 24 years of age. ? You are older than 24 years of age and your health care provider tells you that you are at risk for this type of infection. ? Your sexual activity has changed since you were last screened, and you are at increased risk for chlamydia or gonorrhea. Ask your health care provider if   you are at risk.  Ask your health care provider about whether you are at high risk for HIV. Your health care provider may recommend a prescription medicine to help prevent HIV infection. If you choose to take medicine to prevent HIV, you should first get tested for HIV. You should then be tested every 3 months for as long as you are taking the medicine. Pregnancy  If you are about to stop having your period (premenopausal) and  you may become pregnant, seek counseling before you get pregnant.  Take 400 to 800 micrograms (mcg) of folic acid every day if you become pregnant.  Ask for birth control (contraception) if you want to prevent pregnancy. Osteoporosis and menopause Osteoporosis is a disease in which the bones lose minerals and strength with aging. This can result in bone fractures. If you are 65 years old or older, or if you are at risk for osteoporosis and fractures, ask your health care provider if you should:  Be screened for bone loss.  Take a calcium or vitamin D supplement to lower your risk of fractures.  Be given hormone replacement therapy (HRT) to treat symptoms of menopause. Follow these instructions at home: Lifestyle  Do not use any products that contain nicotine or tobacco, such as cigarettes, e-cigarettes, and chewing tobacco. If you need help quitting, ask your health care provider.  Do not use street drugs.  Do not share needles.  Ask your health care provider for help if you need support or information about quitting drugs. Alcohol use  Do not drink alcohol if: ? Your health care provider tells you not to drink. ? You are pregnant, may be pregnant, or are planning to become pregnant.  If you drink alcohol: ? Limit how much you use to 0-1 drink a day. ? Limit intake if you are breastfeeding.  Be aware of how much alcohol is in your drink. In the [place] one drink equals one 12 oz bottle of beer (355 mL), one 5 oz glass of wine (148 mL), or one 1 oz glass of hard liquor (44 mL). General instructions  Schedule regular health, dental, and eye exams.  Stay current with your vaccines.  Tell your health care provider if: ? You often feel depressed. ? You have ever been abused or do not feel safe at home. Summary  Adopting a healthy lifestyle and getting preventive care are important in promoting health and wellness.  Follow your health care provider's instructions about healthy  diet, exercising, and getting tested or screened for diseases.  Follow your health care provider's instructions on monitoring your cholesterol and blood pressure. This information is not intended to replace advice given to you by your health care provider. Make sure you discuss any questions you have with your health care provider. Document Released: 11/29/2010 Document Revised: 05/09/2018 Document Reviewed: 05/09/2018 Elsevier Patient Education  2020 Elsevier Inc.  

## 2019-05-15 LAB — CMP14+EGFR
ALT: 12 IU/L (ref 0–32)
AST: 8 IU/L (ref 0–40)
Albumin/Globulin Ratio: 1.5 (ref 1.2–2.2)
Albumin: 4 g/dL (ref 3.9–5.0)
Alkaline Phosphatase: 59 IU/L (ref 39–117)
BUN/Creatinine Ratio: 18 (ref 9–23)
BUN: 11 mg/dL (ref 6–20)
Bilirubin Total: <0.2 mg/dL (ref 0.0–1.2)
CO2: 23 mmol/L (ref 20–29)
Calcium: 9.8 mg/dL (ref 8.7–10.2)
Chloride: 103 mmol/L (ref 96–106)
Creatinine, Ser: 0.62 mg/dL (ref 0.57–1.00)
GFR calc Af Amer: 146 mL/min/{1.73_m2} (ref >59)
GFR calc non Af Amer: 127 mL/min/{1.73_m2} (ref >59)
Globulin, Total: 2.6 g/dL (ref 1.5–4.5)
Glucose: 82 mg/dL (ref 65–99)
Potassium: 4.1 mmol/L (ref 3.5–5.2)
Sodium: 139 mmol/L (ref 134–144)
Total Protein: 6.6 g/dL (ref 6.0–8.5)

## 2019-05-15 LAB — CBC
Hematocrit: 40.5 % (ref 34.0–46.6)
Hemoglobin: 12 g/dL (ref 11.1–15.9)
MCH: 22 pg — ABNORMAL LOW (ref 26.6–33.0)
MCHC: 29.6 g/dL — ABNORMAL LOW (ref 31.5–35.7)
MCV: 74 fL — ABNORMAL LOW (ref 79–97)
Platelets: 385 10*3/uL (ref 150–450)
RBC: 5.46 x10E6/uL — ABNORMAL HIGH (ref 3.77–5.28)
RDW: 15.8 % — ABNORMAL HIGH (ref 11.7–15.4)
WBC: 6.2 10*3/uL (ref 3.4–10.8)

## 2019-05-15 LAB — HEMOGLOBIN A1C
Est. average glucose Bld gHb Est-mCnc: 126 mg/dL
Hgb A1c MFr Bld: 6 % — ABNORMAL HIGH (ref 4.8–5.6)

## 2019-05-15 LAB — LIPID PANEL
Chol/HDL Ratio: 3.8 ratio (ref 0.0–4.4)
Cholesterol, Total: 191 mg/dL (ref 100–199)
HDL: 50 mg/dL (ref >39)
LDL Chol Calc (NIH): 131 mg/dL — ABNORMAL HIGH (ref 0–99)
Triglycerides: 54 mg/dL (ref 0–149)
VLDL Cholesterol Cal: 10 mg/dL (ref 5–40)

## 2019-06-12 ENCOUNTER — Encounter: Payer: Self-pay | Admitting: Internal Medicine

## 2019-06-13 ENCOUNTER — Telehealth (INDEPENDENT_AMBULATORY_CARE_PROVIDER_SITE_OTHER): Payer: 59 | Admitting: Nurse Practitioner

## 2019-06-13 ENCOUNTER — Encounter: Payer: Self-pay | Admitting: Nurse Practitioner

## 2019-06-13 ENCOUNTER — Other Ambulatory Visit: Payer: Self-pay

## 2019-06-13 VITALS — Ht 67.0 in | Wt 240.0 lb

## 2019-06-13 DIAGNOSIS — U071 COVID-19: Secondary | ICD-10-CM | POA: Diagnosis not present

## 2019-06-13 DIAGNOSIS — R05 Cough: Secondary | ICD-10-CM | POA: Diagnosis not present

## 2019-06-13 DIAGNOSIS — R059 Cough, unspecified: Secondary | ICD-10-CM

## 2019-06-13 MED ORDER — ALBUTEROL SULFATE HFA 108 (90 BASE) MCG/ACT IN AERS: 2.0000 | INHALATION_SPRAY | Freq: Four times a day (QID) | RESPIRATORY_TRACT | 6 refills | Status: DC | PRN

## 2019-06-13 MED ORDER — PREDNISONE 10 MG (21) PO TBPK: ORAL_TABLET | ORAL | 0 refills | Status: DC

## 2019-06-13 MED ORDER — AZITHROMYCIN 250 MG PO TABS: ORAL_TABLET | ORAL | 0 refills | Status: DC

## 2019-06-13 MED ORDER — BENZONATATE 100 MG PO CAPS: 100.0000 mg | ORAL_CAPSULE | Freq: Four times a day (QID) | ORAL | 1 refills | Status: DC | PRN

## 2019-06-13 NOTE — Progress Notes (Signed)
Virtual Visit via Video   This visit type was conducted due to national recommendations for restrictions regarding the COVID-19 Pandemic (e.g. social distancing) in an effort to limit this patient's exposure and mitigate transmission in our community.  Due to her co-morbid illnesses, this patient is at least at moderate risk for complications without adequate follow up.  This format is felt to be most appropriate for this patient at this time.  All issues noted in this document were discussed and addressed.  A limited physical exam was performed with this format.    This visit type was conducted due to national recommendations for restrictions regarding the COVID-19 Pandemic (e.g. social distancing) in an effort to limit this patient's exposure and mitigate transmission in our community.  Patients identity confirmed using two different identifiers.  This format is felt to be most appropriate for this patient at this time.  All issues noted in this document were discussed and addressed.  No physical exam was performed (except for noted visual exam findings with Video Visits).    Date:  06/23/2019   ID:  Rebecca Nicholson, DOB 09-09-1995, MRN [id number]  Patient Location:  Home - spoke with Rebecca Nicholson  Provider location:   Office    Chief Complaint:  Positive covid test having trouble sleeping and slight shortness of breath  History of Present Illness:    Rebecca Nicholson is a 24 y.o. female who presents via video conferencing for a telehealth visit today.    The patient does have symptoms concerning for COVID-19 infection (fever, chills, cough, or new shortness of breath).   She is positive for covid  Sunday one week ago woke up with her headaches, thought was her allergies. Then had body aches, chills. Went to get tested on Tuesday was negative.  Went to [facility] tested for the flu.  She had worsening symptoms sleeping, decreased appetite. Tested again on Monday with loss of  taste buds.  She is on Day 12. She is coughing up mucous with chest discomfort. Denies shortness of breath.  No fever.  No cough medications. She stopped taking delsym due to history of asthma.  She has albuterol, symbicort, and ventolin inhaler.  She was tested at another urgent care and the test was positive    Past Medical History:  Diagnosis Date  . Anemia   . Asthma    as child  . Hidradenitis suppurativa of right axilla   . Medical history non-contributory   . Moderate persistent asthma without complication 03/14/2018   Past Surgical History:  Procedure Laterality Date  . DENTAL SURGERY    . HYDRADENITIS EXCISION Right 06/10/2014   Procedure: EXCISION OF RIGHT AXILLARY HIDRADENITIS ;  Surgeon: Manus Rudd, MD;  Location: [facility];  Service: General;  Laterality: Right;  . HYDRADENITIS EXCISION Right 04/11/2019   Procedure: EXCISION RIGHT AXILLARY HIDRADENITIS;  Surgeon: Manus Rudd, MD;  Location: [facility];  Service: General;  Laterality: Right;  . TONSILLECTOMY       Current Meds  Medication Sig  . albuterol (VENTOLIN HFA) 108 (90 Base) MCG/ACT inhaler Inhale 2 puffs into the lungs every 6 (six) hours as needed for wheezing.  . budesonide-formoterol (SYMBICORT) 80-4.5 MCG/ACT inhaler Inhale 2 puffs into the lungs 2 (two) times daily.  . norethindrone-ethinyl estradiol (FEMHRT 1/5) 1-5 MG-MCG TABS tablet Take by mouth daily.  . [DISCONTINUED] albuterol (VENTOLIN HFA) 108 (90 Base) MCG/ACT inhaler Inhale 2 puffs into the lungs every 6 (six) hours  as needed for wheezing.     Allergies:   Cephalosporins   Social History   Tobacco Use  . Smoking status: Never Smoker  . Smokeless tobacco: Never Used  Substance Use Topics  . Alcohol use: No  . Drug use: No     Family Hx: The patient's family history includes Breast cancer in her maternal grandmother; Cancer in her father; Colon cancer in her maternal grandmother; Diabetes in her  maternal aunt and mother; Hypertension in her mother. There is no history of Esophageal cancer, Rectal cancer, or Stomach cancer.  ROS:   Please see the history of present illness.    Review of Systems  Constitutional: Positive for malaise/fatigue. Negative for chills and fever.  Respiratory: Positive for cough and shortness of breath (slight).   Cardiovascular: Negative.   Neurological: Negative.   Psychiatric/Behavioral: Negative.     All other systems reviewed and are negative.   Labs/Other Tests and Data Reviewed:    Recent Labs: 05/14/2019: ALT 12; BUN 11; Creatinine, Ser 0.62; Hemoglobin 12.0; Platelets 385; Potassium 4.1; Sodium 139   Recent Lipid Panel Lab Results  Component Value Date/Time   CHOL 191 05/14/2019 10:34 AM   TRIG 54 05/14/2019 10:34 AM   HDL 50 05/14/2019 10:34 AM   CHOLHDL 3.8 05/14/2019 10:34 AM   LDLCALC 131 (H) 05/14/2019 10:34 AM    Wt Readings from Last 3 Encounters:  06/13/19 240 lb (108.9 kg)  05/14/19 250 lb 6.4 oz (113.6 kg)  04/11/19 247 lb 5.7 oz (112.2 kg)     Exam:    Vital Signs:  Ht 5\' 7"  (1.702 m)   Wt 240 lb (108.9 kg)   LMP 06/03/2019 (Approximate)   BMI 37.59 kg/m     Physical Exam  Constitutional: She is oriented to person, place, and time and well-developed, well-nourished, and in no distress. No distress.  Pulmonary/Chest: Effort normal. No respiratory distress.  Neurological: She is alert and oriented to person, place, and time.  Psychiatric: Mood, memory, affect and judgment normal.    ASSESSMENT & PLAN:    1. Cough  I have given her a steroid dose pack with antibiotics since she is on day 12  If her shortness of breath worsens she is to go to the ER  I do not have the covid test results she will have them to fax to the office - benzonatate (TESSALON PERLES) 100 MG capsule; Take 1 capsule (100 mg total) by mouth every 6 (six) hours as needed for cough.  Dispense: 30 capsule; Refill: 1 - predniSONE (STERAPRED  UNI-PAK 21 TAB) 10 MG (21) TBPK tablet; Take as directed  Dispense: 21 tablet; Refill: 0 - azithromycin (ZITHROMAX) 250 MG tablet; Take as directed  Dispense: 6 each; Refill: 0   COVID-19 Education: The signs and symptoms of COVID-19 were discussed with the patient and how to seek care for testing (follow up with PCP or arrange E-visit).  The importance of social distancing was discussed today.  Patient Risk:   After full review of this patients clinical status, I feel that they are at least moderate risk at this time.  Time:   Today, I have spent 12 minutes/ seconds with the patient with telehealth technology discussing above diagnoses.     Medication Adjustments/Labs and Tests Ordered: Current medicines are reviewed at length with the patient today.  Concerns regarding medicines are outlined above.   Tests Ordered: No orders of the defined types were placed in this encounter.   Medication  Changes: Meds ordered this encounter  Medications  . albuterol (VENTOLIN HFA) 108 (90 Base) MCG/ACT inhaler    Sig: Inhale 2 puffs into the lungs every 6 (six) hours as needed for wheezing.    Dispense:  18 g    Refill:  6  . benzonatate (TESSALON PERLES) 100 MG capsule    Sig: Take 1 capsule (100 mg total) by mouth every 6 (six) hours as needed for cough.    Dispense:  30 capsule    Refill:  1  . predniSONE (STERAPRED UNI-PAK 21 TAB) 10 MG (21) TBPK tablet    Sig: Take as directed    Dispense:  21 tablet    Refill:  0  . azithromycin (ZITHROMAX) 250 MG tablet    Sig: Take as directed    Dispense:  6 each    Refill:  0    Disposition:  Follow up prn  Signed, Minette Brine, FNP

## 2019-06-18 ENCOUNTER — Ambulatory Visit: Payer: 59 | Attending: Internal Medicine

## 2019-06-18 DIAGNOSIS — Z20822 Contact with and (suspected) exposure to covid-19: Secondary | ICD-10-CM

## 2019-06-19 LAB — NOVEL CORONAVIRUS, NAA: SARS-CoV-2, NAA: NOT DETECTED

## 2019-06-20 ENCOUNTER — Encounter: Payer: Self-pay | Admitting: Internal Medicine

## 2019-06-23 ENCOUNTER — Encounter: Payer: Self-pay | Admitting: Nurse Practitioner

## 2019-08-22 LAB — HM PAP SMEAR

## 2019-08-27 ENCOUNTER — Encounter: Payer: Self-pay | Admitting: Internal Medicine

## 2019-09-05 ENCOUNTER — Ambulatory Visit
Admission: EM | Admit: 2019-09-05 | Discharge: 2019-09-05 | Disposition: A | Discharge status: Home / Self Care | Payer: 59 | Attending: Emergency Medicine | Admitting: Emergency Medicine

## 2019-09-05 ENCOUNTER — Other Ambulatory Visit: Payer: Self-pay

## 2019-09-05 ENCOUNTER — Encounter: Payer: Self-pay | Admitting: Emergency Medicine

## 2019-09-05 DIAGNOSIS — M25551 Pain in right hip: Secondary | ICD-10-CM | POA: Diagnosis not present

## 2019-09-05 MED ORDER — NAPROXEN 500 MG PO TABS: 500.0000 mg | ORAL_TABLET | Freq: Two times a day (BID) | ORAL | 0 refills | Status: DC

## 2019-09-05 NOTE — Discharge Instructions (Addendum)
Recommend RICE: rest, ice, compression, elevation as needed for pain.    Heat therapy (hot compress, warm wash rag, hot showers, etc.) can help relax muscles and soothe muscle aches. Cold therapy (ice packs) can be used to help swelling both after injury and after prolonged use of areas of chronic pain/aches.  For pain: naproxen  Return for worsening pain, numbness, difficulty with bladder or bowel movements.
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
## 2019-10-10 ENCOUNTER — Encounter: Payer: Self-pay | Admitting: Internal Medicine

## 2019-10-24 ENCOUNTER — Encounter: Payer: Self-pay | Admitting: Internal Medicine

## 2019-11-12 ENCOUNTER — Ambulatory Visit: Payer: 59 | Admitting: Internal Medicine

## 2019-11-18 ENCOUNTER — Encounter: Payer: Self-pay | Admitting: Internal Medicine

## 2019-11-18 ENCOUNTER — Ambulatory Visit: Payer: 59 | Admitting: Internal Medicine

## 2019-11-18 ENCOUNTER — Other Ambulatory Visit: Payer: Self-pay

## 2019-11-18 VITALS — BP 120/62 | HR 74 | Temp 98.1°F | Ht 67.0 in | Wt 254.8 lb

## 2019-11-18 DIAGNOSIS — R635 Abnormal weight gain: Secondary | ICD-10-CM | POA: Diagnosis not present

## 2019-11-18 DIAGNOSIS — R7309 Other abnormal glucose: Secondary | ICD-10-CM | POA: Diagnosis not present

## 2019-11-18 DIAGNOSIS — Z6839 Body mass index (BMI) 39.0-39.9, adult: Secondary | ICD-10-CM

## 2019-11-18 DIAGNOSIS — E6609 Other obesity due to excess calories: Secondary | ICD-10-CM | POA: Diagnosis not present

## 2019-11-18 DIAGNOSIS — Z1159 Encounter for screening for other viral diseases: Secondary | ICD-10-CM

## 2019-11-18 DIAGNOSIS — J4541 Moderate persistent asthma with (acute) exacerbation: Secondary | ICD-10-CM | POA: Diagnosis not present

## 2019-11-18 HISTORY — DX: Abnormal weight gain: R63.5

## 2019-11-18 HISTORY — DX: Moderate persistent asthma with (acute) exacerbation: J45.41

## 2019-11-18 MED ORDER — ALBUTEROL SULFATE (2.5 MG/3ML) 0.083% IN NEBU: 2.5000 mg | INHALATION_SOLUTION | Freq: Four times a day (QID) | RESPIRATORY_TRACT | 12 refills | Status: DC | PRN

## 2019-11-18 MED ORDER — MONTELUKAST SODIUM 10 MG PO TABS: 10.0000 mg | ORAL_TABLET | Freq: Every day | ORAL | 2 refills | Status: DC

## 2019-11-18 NOTE — Progress Notes (Signed)
This visit occurred during the SARS-CoV-2 public health emergency.  Safety protocols were in place, including screening questions prior to the visit, additional usage of staff PPE, and extensive cleaning of exam room while observing appropriate contact time as indicated for disinfecting solutions.  Subjective:     Patient ID: Rebecca Nicholson , female    DOB: 03-09-1996 , 24 y.o.   MRN: [id number]   Chief Complaint  Patient presents with  . Asthma    HPI  She presents today for asthma f/u. She reports she is feeling pretty good now. However, reports having "bad cold" last month. She had to use her sister's nebulizer. She did not require ER/Urgent care evaluation.   Asthma There is no chest tightness or cough. This is a chronic problem. The current episode started more than 1 year ago. Her symptoms are alleviated by beta-agonist. She reports significant improvement on treatment. Her past medical history is significant for asthma.     Past Medical History:  Diagnosis Date  . Anemia   . Asthma    as child  . Hidradenitis suppurativa of right axilla   . Medical history non-contributory   . Moderate persistent asthma without complication 65/07/5463     Family History  Problem Relation Age of Onset  . Hypertension Mother   . Diabetes Mother   . Diabetes Maternal Aunt   . Colon cancer Maternal Grandmother   . Breast cancer Maternal Grandmother   . Cancer Father   . Esophageal cancer Neg Hx   . Rectal cancer Neg Hx   . Stomach cancer Neg Hx      Current Outpatient Medications:  .  albuterol (VENTOLIN HFA) 108 (90 Base) MCG/ACT inhaler, Inhale 2 puffs into the lungs every 6 (six) hours as needed for wheezing., Disp: 18 g, Rfl: 6 .  budesonide-formoterol (SYMBICORT) 80-4.5 MCG/ACT inhaler, Inhale 2 puffs into the lungs 2 (two) times daily., Disp: 1 Inhaler, Rfl: 11 .  norethindrone-ethinyl estradiol (FEMHRT 1/5) 1-5 MG-MCG TABS tablet, Take by mouth daily., Disp: , Rfl:  .   albuterol (PROVENTIL) (2.5 MG/3ML) 0.083% nebulizer solution, Take 3 mLs (2.5 mg total) by nebulization every 6 (six) hours as needed for wheezing or shortness of breath., Disp: 30 mL, Rfl: 12 .  JUNEL FE 1/20 1-20 MG-MCG tablet, Take 1 tablet by mouth daily., Disp: , Rfl:  .  montelukast (SINGULAIR) 10 MG tablet, Take 1 tablet (10 mg total) by mouth daily., Disp: 30 tablet, Rfl: 2   Allergies  Allergen Reactions  . Cephalosporins Rash     Review of Systems  Constitutional: Negative.   Respiratory: Negative.  Negative for cough.   Cardiovascular: Negative.   Gastrointestinal: Negative.   Neurological: Negative.   Psychiatric/Behavioral: Negative.      Today's Vitals   11/18/19 1529  BP: 120/62  Pulse: 74  Temp: 98.1 F (36.7 C)  TempSrc: Oral  Weight: 254 lb 12.8 oz (115.6 kg)  Height: '5\' 7"'  (1.702 m)   Body mass index is 39.91 kg/m.   Objective:  Physical Exam Vitals and nursing note reviewed.  Constitutional:      Appearance: Normal appearance. She is obese.  HENT:     Head: Normocephalic and atraumatic.  Cardiovascular:     Rate and Rhythm: Normal rate and regular rhythm.     Heart sounds: Normal heart sounds.  Pulmonary:     Effort: Pulmonary effort is normal.     Breath sounds: Normal breath sounds.  Skin:  General: Skin is warm.  Neurological:     General: No focal deficit present.     Mental Status: She is alert.  Psychiatric:        Mood and Affect: Mood normal.        Behavior: Behavior normal.         Assessment And Plan:     1. Moderate persistent asthma with acute exacerbation  Chronic, yet stable. I will also add montelukast 32m daily since she is having some allergy issues.  She was also given a nebulizer, forms completed in their entirety during her visit. She was also given rx albuterol nebules to use with nebulizer. She verbalizes understanding on how to use the nebulizer and when to use it. She agrees to f/u in four to six weeks for  re-evaluation.   2. Weight gain  She was made aware of 14 pound weight gain. She is encouraged to incorporate more exercise into her daily routine.   3. Other abnormal glucose  HER A1C HAS BEEN ELEVATED IN THE PAST. I WILL CHECK AN A1C, BMET TODAY. SHE WAS ENCOURAGED TO AVOID SUGARY BEVERAGES AND PROCESSED FOODS INCLUDNG BREADS, RICE AND PASTA.  - CMP14+EGFR - Hemoglobin A1c - Insulin, random(561)  4. Class 2 obesity due to excess calories without serious comorbidity with body mass index (BMI) of 39.0 to 39.9 in adult  She is encouraged to strive for BMI less than 34 to decrease cardiac risk. She is advised to aim for at least 150 minutes of moderate exercise per week. She will also consider use of Wegovy. She denies family and personal h/o thyroid cancer.   5. Encounter for HCV screening test for low risk patient  - Hepatitis C antibody    RMaximino Greenland MD    THE PATIENT IS ENCOURAGED TO PRACTICE SOCIAL DISTANCING DUE TO THE COVID-19 PANDEMIC.

## 2019-11-18 NOTE — Patient Instructions (Signed)
Asthma Attack Prevention, Adult Although you may not be able to control the fact that you have asthma, you can take actions to prevent episodes of asthma (asthma attacks). These actions include:  Creating a written plan for managing and treating your asthma attacks (asthma action plan).  Monitoring your asthma.  Avoiding things that can irritate your airways or make your asthma symptoms worse (asthma triggers).  Taking your medicines as directed.  Acting quickly if you have signs or symptoms of an asthma attack. What are some ways to prevent an asthma attack? Create a plan Work with your health care provider to create an asthma action plan. This plan should include:  A list of your asthma triggers and how to avoid them.  A list of symptoms that you experience during an asthma attack.  Information about when to take medicine and how much medicine to take.  Information to help you understand your peak flow measurements.  Contact information for your health care providers.  Daily actions that you can take to control asthma. Monitor your asthma To monitor your asthma:  Use your peak flow meter every morning and every evening for 2-3 weeks. Record the results in a journal. A drop in your peak flow numbers on one or more days may mean that you are starting to have an asthma attack, even if you are not having symptoms.  When you have asthma symptoms, write them down in a journal.  Avoid asthma triggers Work with your health care provider to find out what your asthma triggers are. This can be done by:  Being tested for allergies.  Keeping a journal that notes when asthma attacks occur and what may have contributed to them.  Asking your health care provider whether other medical conditions make your asthma worse. Common asthma triggers include:  Dust.  Smoke. This includes campfire smoke and secondhand smoke from tobacco products.  Pet dander.  Trees, grasses or  pollens.  Very cold, dry, or humid air.  Mold.  Foods that contain high amounts of sulfites.  Strong smells.  Engine exhaust and air pollution.  Aerosol sprays and fumes from household cleaners.  Household pests and their droppings, including dust mites and cockroaches.  Certain medicines, including NSAIDs. Once you have determined your asthma triggers, take steps to avoid them. Depending on your triggers, you may be able to reduce the chance of an asthma attack by:  Keeping your home clean. Have someone dust and vacuum your home for you 1 or 2 times a week. If possible, have them use a high-efficiency particulate arrestance (HEPA) vacuum.  Washing your sheets weekly in hot water.  Using allergy-proof mattress covers and casings on your bed.  Keeping pets out of your home.  Taking care of mold and water problems in your home.  Avoiding areas where people smoke.  Avoiding using strong perfumes or odor sprays.  Avoid spending a lot of time outdoors when pollen counts are high and on very windy days.  Talking with your health care provider before stopping or starting any new medicines. Medicines Take over-the-counter and prescription medicines only as told by your health care provider. Many asthma attacks can be prevented by carefully following your medicine schedule. Taking your medicines correctly is especially important when you cannot avoid certain asthma triggers. Even if you are doing well, do not stop taking your medicine and do not take less medicine. Act quickly If an asthma attack happens, acting quickly can decrease how severe it is and   how long it lasts. Take these actions:  Pay attention to your symptoms. If you are coughing, wheezing, or having difficulty breathing, do not wait to see if your symptoms go away on their own. Follow your asthma action plan.  If you have followed your asthma action plan and your symptoms are not improving, call your health care  provider or seek immediate medical care at the nearest hospital. It is important to write down how often you need to use your fast-acting rescue inhaler. You can track how often you use an inhaler in your journal. If you are using your rescue inhaler more often, it may mean that your asthma is not under control. Adjusting your asthma treatment plan may help you to prevent future asthma attacks and help you to gain better control of your condition. How can I prevent an asthma attack when I exercise? Exercise is a common asthma trigger. To prevent asthma attacks during exercise:  Follow advice from your health care provider about whether you should use your fast-acting inhaler before exercising. Many people with asthma experience exercise-induced bronchoconstriction (EIB). This condition often worsens during vigorous exercise in cold, humid, or dry environments. Usually, people with EIB can stay very active by using a fast-acting inhaler before exercising.  Avoid exercising outdoors in very cold or humid weather.  Avoid exercising outdoors when pollen counts are high.  Warm up and cool down when exercising.  Stop exercising right away if asthma symptoms start. Consider taking part in exercises that are less likely to cause asthma symptoms such as:  Indoor swimming.  Biking.  Walking.  Hiking.  Playing football. This information is not intended to replace advice given to you by your health care provider. Make sure you discuss any questions you have with your health care provider. Document Revised: 04/28/2017 Document Reviewed: 10/31/2015 Elsevier Patient Education  2020 Elsevier Inc.  

## 2019-11-19 LAB — CMP14+EGFR
ALT: 12 IU/L (ref 0–32)
AST: 9 IU/L (ref 0–40)
Albumin/Globulin Ratio: 1.5 (ref 1.2–2.2)
Albumin: 4.2 g/dL (ref 3.9–5.0)
Alkaline Phosphatase: 53 IU/L (ref 48–121)
BUN/Creatinine Ratio: 22 (ref 9–23)
BUN: 17 mg/dL (ref 6–20)
Bilirubin Total: <0.2 mg/dL (ref 0.0–1.2)
CO2: 23 mmol/L (ref 20–29)
Calcium: 9.8 mg/dL (ref 8.7–10.2)
Chloride: 102 mmol/L (ref 96–106)
Creatinine, Ser: 0.77 mg/dL (ref 0.57–1.00)
GFR calc Af Amer: 125 mL/min/{1.73_m2} (ref >59)
GFR calc non Af Amer: 108 mL/min/{1.73_m2} (ref >59)
Globulin, Total: 2.8 g/dL (ref 1.5–4.5)
Glucose: 78 mg/dL (ref 65–99)
Potassium: 4.1 mmol/L (ref 3.5–5.2)
Sodium: 137 mmol/L (ref 134–144)
Total Protein: 7 g/dL (ref 6.0–8.5)

## 2019-11-19 LAB — HEPATITIS C ANTIBODY: Hep C Virus Ab: <0.1 s/co ratio (ref 0.0–0.9)

## 2019-11-19 LAB — HEMOGLOBIN A1C
Est. average glucose Bld gHb Est-mCnc: 123 mg/dL
Hgb A1c MFr Bld: 5.9 % — ABNORMAL HIGH (ref 4.8–5.6)

## 2019-11-19 LAB — INSULIN, RANDOM: INSULIN: 15.5 u[IU]/mL (ref 2.6–24.9)

## 2020-01-02 ENCOUNTER — Other Ambulatory Visit: Payer: Self-pay

## 2020-01-02 ENCOUNTER — Encounter: Payer: Self-pay | Admitting: Internal Medicine

## 2020-01-02 ENCOUNTER — Ambulatory Visit: Payer: 59 | Admitting: Internal Medicine

## 2020-01-02 VITALS — BP 120/72 | HR 86 | Temp 98.6°F | Ht 67.0 in | Wt 247.0 lb

## 2020-01-02 DIAGNOSIS — Z6838 Body mass index (BMI) 38.0-38.9, adult: Secondary | ICD-10-CM

## 2020-01-02 DIAGNOSIS — L732 Hidradenitis suppurativa: Secondary | ICD-10-CM

## 2020-01-02 DIAGNOSIS — E6609 Other obesity due to excess calories: Secondary | ICD-10-CM | POA: Diagnosis not present

## 2020-01-02 DIAGNOSIS — J4540 Moderate persistent asthma, uncomplicated: Secondary | ICD-10-CM | POA: Diagnosis not present

## 2020-01-02 MED ORDER — MONTELUKAST SODIUM 10 MG PO TABS: 10.0000 mg | ORAL_TABLET | Freq: Every day | ORAL | 2 refills | Status: DC

## 2020-01-02 NOTE — Progress Notes (Signed)
I,Katawbba Wiggins,acting as a Neurosurgeon for [name], MD.,have documented all relevant documentation on the behalf of [name], MD,as directed by  [name], MD while in the presence of [name], MD.  This visit occurred during the SARS-CoV-2 public health emergency.  Safety protocols were in place, including screening questions prior to the visit, additional usage of staff PPE, and extensive cleaning of exam room while observing appropriate contact time as indicated for disinfecting solutions.  Subjective:     Patient ID: Rebecca Nicholson , female    DOB: 10-17-1995 , 24 y.o.   MRN: [id number]   Chief Complaint  Patient presents with  . Medication Management    symbicort    HPI  She presents today for asthma f/u.  She reports compliance with meds. She has not been having any issues recently. She is not using her albuterol inhaler on a regular basis.   Asthma There is no chest tightness or cough. This is a chronic problem. The current episode started more than 1 year ago. Her symptoms are alleviated by beta-agonist. She reports significant improvement on treatment. Her past medical history is significant for asthma.     Past Medical History:  Diagnosis Date  . Anemia   . Asthma    as child  . Hidradenitis suppurativa of right axilla   . Medical history non-contributory   . Moderate persistent asthma without complication 03/14/2018     Family History  Problem Relation Age of Onset  . Hypertension Mother   . Diabetes Mother   . Diabetes Maternal Aunt   . Colon cancer Maternal Grandmother   . Breast cancer Maternal Grandmother   . Cancer Father   . Esophageal cancer Neg Hx   . Rectal cancer Neg Hx   . Stomach cancer Neg Hx      Current Outpatient Medications:  .  albuterol (PROVENTIL) (2.5 MG/3ML) 0.083% nebulizer solution, Take 3 mLs (2.5 mg total) by nebulization every 6 (six) hours as needed for wheezing or shortness of breath., Disp: 30 mL,  Rfl: 12 .  albuterol (VENTOLIN HFA) 108 (90 Base) MCG/ACT inhaler, Inhale 2 puffs into the lungs every 6 (six) hours as needed for wheezing., Disp: 18 g, Rfl: 6 .  budesonide-formoterol (SYMBICORT) 80-4.5 MCG/ACT inhaler, Inhale 2 puffs into the lungs 2 (two) times daily., Disp: 1 Inhaler, Rfl: 11 .  JUNEL FE 1/20 1-20 MG-MCG tablet, Take 1 tablet by mouth daily., Disp: , Rfl:  .  montelukast (SINGULAIR) 10 MG tablet, Take 1 tablet (10 mg total) by mouth daily., Disp: 90 tablet, Rfl: 2   Allergies  Allergen Reactions  . Cephalosporins Rash     Review of Systems  Constitutional: Negative.   Respiratory: Negative.  Negative for cough.   Cardiovascular: Negative.   Gastrointestinal: Negative.   Psychiatric/Behavioral: Negative.   All other systems reviewed and are negative.    Today's Vitals   01/02/20 1548  BP: 120/72  Pulse: 86  Temp: 98.6 F (37 C)  TempSrc: Oral  Weight: 247 lb (112 kg)  Height: 5\' 7"  (1.702 m)   Body mass index is 38.69 kg/m.  Wt Readings from Last 3 Encounters:  01/02/20 247 lb (112 kg)  11/18/19 254 lb 12.8 oz (115.6 kg)  06/13/19 240 lb (108.9 kg)   Objective:  Physical Exam Vitals and nursing note reviewed.  Constitutional:      Appearance: Normal appearance. She is obese.  HENT:     Head: Normocephalic  and atraumatic.  Cardiovascular:     Rate and Rhythm: Normal rate and regular rhythm.     Heart sounds: Normal heart sounds.  Pulmonary:     Breath sounds: Normal breath sounds.  Skin:    General: Skin is warm.  Neurological:     General: No focal deficit present.     Mental Status: She is alert and oriented to person, place, and time.         Assessment And Plan:     1. Moderate persistent asthma without complication Comments: Chronic, yet stable. She will continue with current meds for now. Encouraged to take meds as prescribed and to avoid triggers.   2. Hidradenitis suppurativa Comments: Chronic, she is s/p axillary surgery.  Advised to was and NOT shave, or to use Darene Lamer. Will f/u with surgeon if she continues to have any issues.  3. Class 2 obesity due to excess calories without serious comorbidity with body mass index (BMI) of 38.0 to 38.9 in adult Comments: We discussed use of Wegovy for weight loss. States her sister wants her to do it "naturally" first.  Encouraged ot aim for at least 150 minutes of exercise/week  She is encouraged to strive for BMI less than 30 to decrease cardiac risk. Advised to aim for at least 150 minutes of exercise per week.   Patient was given opportunity to ask questions. Patient verbalized understanding of the plan and was able to repeat key elements of the plan. All questions were answered to their satisfaction.  [name], MD   I, [name], MD, have reviewed all documentation for this visit. The documentation on 01/11/20 for the exam, diagnosis, procedures, and orders are all accurate and complete.  THE PATIENT IS ENCOURAGED TO PRACTICE SOCIAL DISTANCING DUE TO THE COVID-19 PANDEMIC.

## 2020-01-02 NOTE — Patient Instructions (Signed)
Asthma, Adult  Asthma is a long-term (chronic) condition in which the airways get tight and narrow. The airways are the breathing passages that lead from the nose and mouth down into the lungs. A person with asthma will have times when symptoms get worse. These are called asthma attacks. They can cause coughing, whistling sounds when you breathe (wheezing), shortness of breath, and chest pain. They can make it hard to breathe. There is no cure for asthma, but medicines and lifestyle changes can help control it. There are many things that can bring on an asthma attack or make asthma symptoms worse (triggers). Common triggers include:  Mold.  Dust.  Cigarette smoke.  Cockroaches.  Things that can cause allergy symptoms (allergens). These include animal skin flakes (dander) and pollen from trees or grass.  Things that pollute the air. These may include household cleaners, wood smoke, smog, or chemical odors.  Cold air, weather changes, and wind.  Crying or laughing hard.  Stress.  Certain medicines or drugs.  Certain foods such as dried fruit, potato chips, and grape juice.  Infections, such as a cold or the flu.  Certain medical conditions or diseases.  Exercise or tiring activities. Asthma may be treated with medicines and by staying away from the things that cause asthma attacks. Types of medicines may include:  Controller medicines. These help prevent asthma symptoms. They are usually taken every day.  Fast-acting reliever or rescue medicines. These quickly relieve asthma symptoms. They are used as needed and provide short-term relief.  Allergy medicines if your attacks are brought on by allergens.  Medicines to help control the body's defense (immune) system. Follow these instructions at home: Avoiding triggers in your home  Change your heating and air conditioning filter often.  Limit your use of fireplaces and wood stoves.  Get rid of pests (such as roaches and  mice) and their droppings.  Throw away plants if you see mold on them.  Clean your floors. Dust regularly. Use cleaning products that do not smell.  Have someone vacuum when you are not home. Use a vacuum cleaner with a HEPA filter if possible.  Replace carpet with wood, tile, or vinyl flooring. Carpet can trap animal skin flakes and dust.  Use allergy-proof pillows, mattress covers, and box spring covers.  Wash bed sheets and blankets every week in hot water. Dry them in a dryer.  Keep your bedroom free of any triggers.  Avoid pets and keep windows closed when things that cause allergy symptoms are in the air.  Use blankets that are made of polyester or cotton.  Clean bathrooms and kitchens with bleach. If possible, have someone repaint the walls in these rooms with mold-resistant paint. Keep out of the rooms that are being cleaned and painted.  Wash your hands often with soap and water. If soap and water are not available, use hand sanitizer.  Do not allow anyone to smoke in your home. General instructions  Take over-the-counter and prescription medicines only as told by your doctor. ? Talk with your doctor if you have questions about how or when to take your medicines. ? Make note if you need to use your medicines more often than usual.  Do not use any products that contain nicotine or tobacco, such as cigarettes and e-cigarettes. If you need help quitting, ask your doctor.  Stay away from secondhand smoke.  Avoid doing things outdoors when allergen counts are high and when air quality is low.  Wear a ski mask   when doing outdoor activities in the winter. The mask should cover your nose and mouth. Exercise indoors on cold days if you can.  Warm up before you exercise. Take time to cool down after exercise.  Use a peak flow meter as told by your doctor. A peak flow meter is a tool that measures how well the lungs are working.  Keep track of the peak flow meter's readings.  Write them down.  Follow your asthma action plan. This is a written plan for taking care of your asthma and treating your attacks.  Make sure you get all the shots (vaccines) that your doctor recommends. Ask your doctor about a flu shot and a pneumonia shot.  Keep all follow-up visits as told by your doctor. This is important. Contact a doctor if:  You have wheezing, shortness of breath, or a cough even while taking medicine to prevent attacks.  The mucus you cough up (sputum) is thicker than usual.  The mucus you cough up changes from clear or white to yellow, green, gray, or bloody.  You have problems from the medicine you are taking, such as: ? A rash. ? Itching. ? Swelling. ? Trouble breathing.  You need reliever medicines more than 2-3 times a week.  Your peak flow reading is still at 50-79% of your personal best after following the action plan for 1 hour.  You have a fever. Get help right away if:  You seem to be worse and are not responding to medicine during an asthma attack.  You are short of breath even at rest.  You get short of breath when doing very little activity.  You have trouble eating, drinking, or talking.  You have chest pain or tightness.  You have a fast heartbeat.  Your lips or fingernails start to turn blue.  You are light-headed or dizzy, or you faint.  Your peak flow is less than 50% of your personal best.  You feel too tired to breathe normally. Summary  Asthma is a long-term (chronic) condition in which the airways get tight and narrow. An asthma attack can make it hard to breathe.  Asthma cannot be cured, but medicines and lifestyle changes can help control it.  Make sure you understand how to avoid triggers and how and when to use your medicines. This information is not intended to replace advice given to you by your health care provider. Make sure you discuss any questions you have with your health care provider. Document Revised:  07/19/2018 Document Reviewed: 06/20/2016 Elsevier Patient Education  2020 Elsevier Inc.  

## 2020-02-25 ENCOUNTER — Other Ambulatory Visit: Payer: 59

## 2020-02-25 DIAGNOSIS — Z20822 Contact with and (suspected) exposure to covid-19: Secondary | ICD-10-CM

## 2020-02-26 LAB — NOVEL CORONAVIRUS, NAA: SARS-CoV-2, NAA: NOT DETECTED

## 2020-02-26 LAB — SARS-COV-2, NAA 2 DAY TAT

## 2020-03-03 ENCOUNTER — Encounter: Payer: Self-pay | Admitting: Internal Medicine

## 2020-03-05 ENCOUNTER — Encounter: Payer: Self-pay | Admitting: Nurse Practitioner

## 2020-03-05 ENCOUNTER — Other Ambulatory Visit: Payer: Self-pay

## 2020-03-05 ENCOUNTER — Ambulatory Visit: Payer: 59 | Admitting: Nurse Practitioner

## 2020-03-05 VITALS — BP 124/80 | HR 80 | Temp 98.8°F | Ht 67.0 in | Wt 247.0 lb

## 2020-03-05 DIAGNOSIS — F4321 Adjustment disorder with depressed mood: Secondary | ICD-10-CM | POA: Diagnosis not present

## 2020-03-05 DIAGNOSIS — Z23 Encounter for immunization: Secondary | ICD-10-CM

## 2020-03-05 NOTE — Patient Instructions (Signed)
Managing Loss, Adult People experience loss in many different ways throughout their lives. Events such as moving, changing jobs, and losing friends can create a sense of loss. The loss may be as serious as a major health change, divorce, death of a pet, or death of a loved one. All of these types of loss are likely to create a physical and emotional reaction known as grief. Grief is the result of a major change or an absence of something or someone that you count on. Grief is a normal reaction to loss. A variety of factors can affect your grieving experience, including:  The nature of your loss.  Your relationship to what or whom you lost.  Your understanding of grief and how to manage it.  Your support system. How to manage lifestyle changes Keep to your normal routine as much as possible.  If you have trouble focusing or doing normal activities, it is acceptable to take some time away from your normal routine.  Spend time with friends and loved ones.  Eat a healthy diet, get plenty of sleep, and rest when you feel tired. How to recognize changes  The way that you deal with your grief will affect your ability to function as you normally do. When grieving, you may experience these changes:  Numbness, shock, sadness, anxiety, anger, denial, and guilt.  Thoughts about death.  Unexpected crying.  A physical sensation of emptiness in your stomach.  Problems sleeping and eating.  Tiredness (fatigue).  Loss of interest in normal activities.  Dreaming about or imagining seeing the person who died.  A need to remember what or whom you lost.  Difficulty thinking about anything other than your loss for a period of time.  Relief. If you have been expecting the loss for a while, you may feel a sense of relief when it happens. Follow these instructions at home:  Activity Express your feelings in healthy ways, such as:  Talking with others about your loss. It may be helpful to find  others who have had a similar loss, such as a support group.  Writing down your feelings in a journal.  Doing physical activities to release stress and emotional energy.  Doing creative activities like painting, sculpting, or playing or listening to music.  Practicing resilience. This is the ability to recover and adjust after facing challenges. Reading some resources that encourage resilience may help you to learn ways to practice those behaviors. General instructions  Be patient with yourself and others. Allow the grieving process to happen, and remember that grieving takes time. ? It is likely that you may never feel completely done with some grief. You may find a way to move on while still cherishing memories and feelings about your loss. ? Accepting your loss is a process. It can take months or longer to adjust.  Keep all follow-up visits as told by your health care provider. This is important. Where to find support To get support for managing loss:  Ask your health care provider for help and recommendations, such as grief counseling or therapy.  Think about joining a support group for people who are managing a loss. Where to find more information You can find more information about managing loss from:  American Society of Clinical Oncology: www.cancer.net  American Psychological Association: www.apa.org Contact a health care provider if:  Your grief is extreme and keeps getting worse.  You have ongoing grief that does not improve.  Your body shows symptoms of grief, such   as illness.  You feel depressed, anxious, or lonely. Get help right away if:  You have thoughts about hurting yourself or others. If you ever feel like you may hurt yourself or others, or have thoughts about taking your own life, get help right away. You can go to your nearest emergency department or call:  Your local emergency services (911 in the [place]  A suicide crisis helpline, such as the  National Suicide Prevention Lifeline at 779-591-8039. This is open 24 hours a day. Summary  Grief is the result of a major change or an absence of someone or something that you count on. Grief is a normal reaction to loss.  The depth of grief and the period of recovery depend on the type of loss and your ability to adjust to the change and process your feelings.  Processing grief requires patience and a willingness to accept your feelings and talk about your loss with people who are supportive.  It is important to find resources that work for you and to realize that people experience grief differently. There is not one grieving process that works for everyone in the same way.  Be aware that when grief becomes extreme, it can lead to more severe issues like isolation, depression, anxiety, or suicidal thoughts. Talk with your health care provider if you have any of these issues. This information is not intended to replace advice given to you by your health care provider. Make sure you discuss any questions you have with your health care provider. Document Revised: 07/20/2018 Document Reviewed: 09/29/2016 Elsevier Patient Education  2020 [employer].  Send a Mychart message around 10/20 to let us know how you are doing.

## 2020-03-05 NOTE — Progress Notes (Signed)
I,Yamilka Roman [employer] as a Neurosurgeon for [employer], FNP.,have documented all relevant documentation on the behalf of Arnette Felts, FNP,as directed by  Arnette Felts, FNP while in the presence of Arnette Felts, FNP.  This visit occurred during the SARS-CoV-2 public health emergency.  Safety protocols were in place, including screening questions prior to the visit, additional usage of staff PPE, and extensive cleaning of exam room while observing appropriate contact time as indicated for disinfecting solutions.  Subjective:     Patient ID: Rebecca Nicholson , female    DOB: 03-26-1996 , 24 y.o.   MRN: [id number]   Chief Complaint  Patient presents with  . grief    patient's father recently passed away    HPI  She is here today for grief.  Her father passed away on 09/29/2022 from bone Cancer.  She is working at [employer]. He was in [facility].  She feels she needs additional time to process this loss.      Past Medical History:  Diagnosis Date  . Anemia   . Asthma    as child  . Hidradenitis suppurativa of right axilla   . Medical history non-contributory   . Moderate persistent asthma without complication 03/14/2018     Family History  Problem Relation Age of Onset  . Hypertension Mother   . Diabetes Mother   . Diabetes Maternal Aunt   . Colon cancer Maternal Grandmother   . Breast cancer Maternal Grandmother   . Cancer Father   . Esophageal cancer Neg Hx   . Rectal cancer Neg Hx   . Stomach cancer Neg Hx      Current Outpatient Medications:  .  albuterol (PROVENTIL) (2.5 MG/3ML) 0.083% nebulizer solution, Take 3 mLs (2.5 mg total) by nebulization every 6 (six) hours as needed for wheezing or shortness of breath., Disp: 30 mL, Rfl: 12 .  albuterol (VENTOLIN HFA) 108 (90 Base) MCG/ACT inhaler, Inhale 2 puffs into the lungs every 6 (six) hours as needed for wheezing., Disp: 18 g, Rfl: 6 .  budesonide-formoterol (SYMBICORT) 80-4.5 MCG/ACT inhaler,  Inhale 2 puffs into the lungs 2 (two) times daily., Disp: 1 Inhaler, Rfl: 11 .  JUNEL FE 1/20 1-20 MG-MCG tablet, Take 1 tablet by mouth daily., Disp: , Rfl:  .  montelukast (SINGULAIR) 10 MG tablet, Take 1 tablet (10 mg total) by mouth daily., Disp: 90 tablet, Rfl: 2   Allergies  Allergen Reactions  . Cephalosporins Rash     Review of Systems  Constitutional: Negative.   Respiratory: Negative.  Negative for wheezing.   Cardiovascular: Negative.  Negative for chest pain, palpitations and leg swelling.  Neurological: Negative for dizziness and headaches.  Psychiatric/Behavioral: Negative.      Today's Vitals   03/05/20 0909  BP: 124/80  Pulse: 80  Temp: 98.8 F (37.1 C)  TempSrc: Oral  Weight: 247 lb (112 kg)  Height: 5\' 7"  (1.702 m)  PainSc: 0-No pain   Body mass index is 38.69 kg/m.   Objective:  Physical Exam Constitutional:      General: She is not in acute distress.    Appearance: Normal appearance.  Cardiovascular:     Rate and Rhythm: Normal rate.     Pulses: Normal pulses.     Heart sounds: Normal heart sounds. No murmur heard.   Neurological:     Mental Status: She is alert.  Psychiatric:        Behavior: Behavior normal.  Thought Content: Thought content normal.        Judgment: Judgment normal.     Comments: Flat yet appropriate with the recent loss of her father          Assessment And Plan:     1. Grief  Recent loss of her father and needing time to process.  I will give her 2 weeks off  Encouraged to seek grief counseling with the hospice he was affiliated with.    2. Need for influenza vaccination  Influenza vaccine administered  Encouraged to take Tylenol as needed for fever or muscle aches. - Flu Vaccine QUAD 6+ mos PF IM (Fluarix Quad PF)     Patient was given opportunity to ask questions. Patient verbalized understanding of the plan and was able to repeat key elements of the plan. All questions were answered to their  satisfaction.   Jeanell Sparrow, FNP, have reviewed all documentation for this visit. The documentation on 03/18/20 for the exam, diagnosis, procedures, and orders are all accurate and complete.  THE PATIENT IS ENCOURAGED TO PRACTICE SOCIAL DISTANCING DUE TO THE COVID-19 PANDEMIC.

## 2020-05-14 ENCOUNTER — Encounter: Payer: Self-pay | Admitting: Internal Medicine

## 2020-05-14 ENCOUNTER — Ambulatory Visit: Payer: 59 | Admitting: Internal Medicine

## 2020-05-14 ENCOUNTER — Other Ambulatory Visit: Payer: Self-pay

## 2020-05-14 VITALS — BP 110/86 | HR 56 | Temp 97.8°F | Ht 66.2 in | Wt 245.0 lb

## 2020-05-14 DIAGNOSIS — Z0000 Encounter for general adult medical examination without abnormal findings: Secondary | ICD-10-CM

## 2020-05-14 DIAGNOSIS — Z6839 Body mass index (BMI) 39.0-39.9, adult: Secondary | ICD-10-CM | POA: Diagnosis not present

## 2020-05-14 DIAGNOSIS — J4540 Moderate persistent asthma, uncomplicated: Secondary | ICD-10-CM | POA: Diagnosis not present

## 2020-05-14 DIAGNOSIS — E6609 Other obesity due to excess calories: Secondary | ICD-10-CM

## 2020-05-14 DIAGNOSIS — F4321 Adjustment disorder with depressed mood: Secondary | ICD-10-CM

## 2020-05-14 MED ORDER — ALBUTEROL SULFATE (2.5 MG/3ML) 0.083% IN NEBU
2.5000 mg | INHALATION_SOLUTION | Freq: Four times a day (QID) | RESPIRATORY_TRACT | 12 refills | Status: DC | PRN
Start: 2020-05-14 — End: 2021-05-17

## 2020-05-14 MED ORDER — ALBUTEROL SULFATE HFA 108 (90 BASE) MCG/ACT IN AERS: 2.0000 | INHALATION_SPRAY | Freq: Four times a day (QID) | RESPIRATORY_TRACT | 6 refills | Status: DC | PRN

## 2020-05-14 NOTE — Progress Notes (Signed)
I,Katawbba Wiggins,acting as a Education administrator for Maximino Greenland, MD.,have documented all relevant documentation on the behalf of Maximino Greenland, MD,as directed by  Maximino Greenland, MD while in the presence of Maximino Greenland, MD.  This visit occurred during the SARS-CoV-2 public health emergency.  Safety protocols were in place, including screening questions prior to the visit, additional usage of staff PPE, and extensive cleaning of exam room while observing appropriate contact time as indicated for disinfecting solutions.  Subjective:     Patient ID: Rebecca Nicholson , female    DOB: 07-10-1995 , 24 y.o.   MRN: [id number]   Chief Complaint  Patient presents with  . Annual Exam    HPI  She is here today for a full physical examination. She is followed by Earnstine Regal, PA for her GYN exams. She was last seen Feb 2020 and a pap smear was performed at that time.   Unfortunately, her father passed unexpectedly 02/26/20. She reports she is having a difficult time dealing with the loss.     Past Medical History:  Diagnosis Date  . Anemia   . Asthma    as child  . Hidradenitis suppurativa of right axilla   . Medical history non-contributory   . Moderate persistent asthma without complication 13/68/5992     Family History  Problem Relation Age of Onset  . Hypertension Mother   . Diabetes Mother   . Diabetes Maternal Aunt   . Colon cancer Maternal Grandmother   . Breast cancer Maternal Grandmother   . Cancer Father   . Esophageal cancer Neg Hx   . Rectal cancer Neg Hx   . Stomach cancer Neg Hx      Current Outpatient Medications:  .  budesonide-formoterol (SYMBICORT) 80-4.5 MCG/ACT inhaler, Inhale 2 puffs into the lungs 2 (two) times daily., Disp: 1 Inhaler, Rfl: 11 .  JUNEL FE 1/20 1-20 MG-MCG tablet, Take 1 tablet by mouth daily., Disp: , Rfl:  .  montelukast (SINGULAIR) 10 MG tablet, Take 1 tablet (10 mg total) by mouth daily., Disp: 90 tablet, Rfl: 2 .  albuterol  (PROVENTIL) (2.5 MG/3ML) 0.083% nebulizer solution, Take 3 mLs (2.5 mg total) by nebulization every 6 (six) hours as needed for wheezing or shortness of breath., Disp: 30 mL, Rfl: 12 .  albuterol (VENTOLIN HFA) 108 (90 Base) MCG/ACT inhaler, Inhale 2 puffs into the lungs every 6 (six) hours as needed for wheezing., Disp: 18 g, Rfl: 6   Allergies  Allergen Reactions  . Cephalosporins Rash     Men's preventive visit. Patient Health Questionnaire (PHQ-2) is  Flowsheet Row Telemedicine from 06/13/2019 in [facility]  PHQ-2 Total Score 0    . Patient is on a healthy diet. Marital status: Single. Relevant history for alcohol use is:  Social History   Substance and Sexual Activity  Alcohol Use No  . Relevant history for tobacco use is:  Social History   Tobacco Use  Smoking Status Never Smoker  Smokeless Tobacco Never Used  .   Review of Systems  Constitutional: Negative.   HENT: Negative.   Eyes: Negative.   Respiratory: Negative.   Cardiovascular: Negative.   Gastrointestinal: Negative.   Endocrine: Negative.   Genitourinary: Negative.   Musculoskeletal: Negative.   Skin: Negative.   Allergic/Immunologic: Negative.   Neurological: Negative.   Hematological: Negative.   Psychiatric/Behavioral: Negative.      Today's Vitals   05/14/20 1125  BP: 110/86  Pulse: (!) 56  Temp: 97.8 F (36.6 C)  TempSrc: Oral  SpO2: 98%  Weight: 245 lb (111.1 kg)  Height: 5' 6.2" (1.681 m)   Body mass index is 39.31 kg/m.   Wt Readings from Last 3 Encounters:  05/14/20 245 lb (111.1 kg)  03/05/20 247 lb (112 kg)  01/02/20 247 lb (112 kg)    Objective:  Physical Exam Vitals and nursing note reviewed.  Constitutional:      General: She is not in acute distress.    Appearance: Normal appearance. She is well-developed. She is obese.  HENT:     Head: Normocephalic and atraumatic.     Right Ear: Hearing, tympanic membrane, ear canal and external ear normal.  There is no impacted cerumen.     Left Ear: Hearing, tympanic membrane, ear canal and external ear normal. There is no impacted cerumen.     Nose:     Comments: Deferred, masked    Mouth/Throat:     Comments: Deferred, masked Eyes:     General: Lids are normal.     Extraocular Movements: Extraocular movements intact.     Conjunctiva/sclera: Conjunctivae normal.     Pupils: Pupils are equal, round, and reactive to light.     Funduscopic exam:    Right eye: No papilledema.        Left eye: No papilledema.  Neck:     Thyroid: No thyroid mass.     Vascular: No carotid bruit.  Cardiovascular:     Rate and Rhythm: Normal rate and regular rhythm.     Pulses: Normal pulses.     Heart sounds: Normal heart sounds. No murmur heard.   Pulmonary:     Effort: Pulmonary effort is normal.     Breath sounds: Normal breath sounds.  Chest:  Breasts:     Tanner Score is 5.     Right: Normal.    Abdominal:     General: Bowel sounds are normal. There is no distension.     Palpations: Abdomen is soft.     Tenderness: There is no abdominal tenderness.     Comments: Obese, soft  Genitourinary:    Comments: Deferred Musculoskeletal:        General: No swelling. Normal range of motion.     Cervical back: Full passive range of motion without pain, normal range of motion and neck supple.     Right lower leg: No edema.     Left lower leg: No edema.  Skin:    General: Skin is warm and dry.     Capillary Refill: Capillary refill takes less than 2 seconds.  Neurological:     General: No focal deficit present.     Mental Status: She is alert and oriented to person, place, and time.     Cranial Nerves: No cranial nerve deficit.     Sensory: No sensory deficit.  Psychiatric:        Mood and Affect: Mood normal.        Behavior: Behavior normal.        Thought Content: Thought content normal.        Judgment: Judgment normal.         Assessment And Plan:    1. Routine general medical  examination at health care facility Comments: A full exam was performed. Importance of monthly self breast exams was discussed with the patient. PATIENT IS ADVISED TO GET 30-45 MINUTES REGULAR EXERCISE NO LESS THAN FOUR TO FIVE DAYS PER WEEK - BOTH WEIGHTBEARING  EXERCISES AND AEROBIC ARE RECOMMENDED.  PATIENT IS ADVISED TO FOLLOW A HEALTHY DIET WITH AT LEAST SIX FRUITS/VEGGIES PER DAY, DECREASE INTAKE OF RED MEAT, AND TO INCREASE FISH INTAKE TO TWO DAYS PER WEEK.  MEATS/FISH SHOULD NOT BE FRIED, BAKED OR BROILED IS PREFERABLE.  I SUGGEST WEARING SPF 50 SUNSCREEN ON EXPOSED PARTS AND ESPECIALLY WHEN IN THE DIRECT SUNLIGHT FOR AN EXTENDED PERIOD OF TIME.  PLEASE AVOID FAST FOOD RESTAURANTS AND INCREASE YOUR WATER INTAKE. - Hemoglobin A1c - CBC - CMP14+EGFR - Lipid panel  2. Moderate persistent asthma without complication Comments: Chronic, yet stable. She is encouraged to avoid known triggers as best as possible.  - albuterol (PROVENTIL) (2.5 MG/3ML) 0.083% nebulizer solution; Take 3 mLs (2.5 mg total) by nebulization every 6 (six) hours as needed for wheezing or shortness of breath.  Dispense: 30 mL; Refill: 12 - albuterol (VENTOLIN HFA) 108 (90 Base) MCG/ACT inhaler; Inhale 2 puffs into the lungs every 6 (six) hours as needed for wheezing.  Dispense: 18 g; Refill: 6  3. Grief Comments: Associated with loss of father. Agrees to hospice referral for grief counseling.  - Ambulatory referral to Hospice  4. Class 2 obesity due to excess calories without serious comorbidity with body mass index (BMI) of 39.0 to 39.9 in adult Comments: She was congratulated on her 2 pound weight loss since Oct. Encouraged to aim for at least 150 minutes of exercise per week.   Patient was given opportunity to ask questions. Patient verbalized understanding of the plan and was able to repeat key elements of the plan. All questions were answered to their satisfaction.  Maximino Greenland, MD   I, Maximino Greenland, MD, have  reviewed all documentation for this visit. The documentation on 05/30/20 for the exam, diagnosis, procedures, and orders are all accurate and complete.  THE PATIENT IS ENCOURAGED TO PRACTICE SOCIAL DISTANCING DUE TO THE COVID-19 PANDEMIC.

## 2020-05-14 NOTE — Patient Instructions (Addendum)
Health Maintenance, Female Adopting a healthy lifestyle and getting preventive care are important in promoting health and wellness. Ask your health care provider about:  The right schedule for you to have regular tests and exams.  Things you can do on your own to prevent diseases and keep yourself healthy. What should I know about diet, weight, and exercise? Eat a healthy diet   Eat a diet that includes plenty of vegetables, fruits, low-fat dairy products, and lean protein.  Do not eat a lot of foods that are high in solid fats, added sugars, or sodium. Maintain a healthy weight Body mass index (BMI) is used to identify weight problems. It estimates body fat based on height and weight. Your health care provider can help determine your BMI and help you achieve or maintain a healthy weight. Get regular exercise Get regular exercise. This is one of the most important things you can do for your health. Most adults should:  Exercise for at least 150 minutes each week. The exercise should increase your heart rate and make you sweat (moderate-intensity exercise).  Do strengthening exercises at least twice a week. This is in addition to the moderate-intensity exercise.  Spend less time sitting. Even light physical activity can be beneficial. Watch cholesterol and blood lipids Have your blood tested for lipids and cholesterol at 25 years of age, then have this test every 5 years. Have your cholesterol levels checked more often if:  Your lipid or cholesterol levels are high.  You are older than 25 years of age.  You are at high risk for heart disease. What should I know about cancer screening? Depending on your health history and family history, you may need to have cancer screening at various ages. This may include screening for:  Breast cancer.  Cervical cancer.  Colorectal cancer.  Skin cancer.  Lung cancer. What should I know about heart disease, diabetes, and high blood  pressure? Blood pressure and heart disease  High blood pressure causes heart disease and increases the risk of stroke. This is more likely to develop in people who have high blood pressure readings, are of African descent, or are overweight.  Have your blood pressure checked: ? Every 3-5 years if you are 18-39 years of age. ? Every year if you are 40 years old or older. Diabetes Have regular diabetes screenings. This checks your fasting blood sugar level. Have the screening done:  Once every three years after age 40 if you are at a normal weight and have a low risk for diabetes.  More often and at a younger age if you are overweight or have a high risk for diabetes. What should I know about preventing infection? Hepatitis B If you have a higher risk for hepatitis B, you should be screened for this virus. Talk with your health care provider to find out if you are at risk for hepatitis B infection. Hepatitis C Testing is recommended for:  Everyone born from 1945 through 1965.  Anyone with known risk factors for hepatitis C. Sexually transmitted infections (STIs)  Get screened for STIs, including gonorrhea and chlamydia, if: ? You are sexually active and are younger than 24 years of age. ? You are older than 24 years of age and your health care provider tells you that you are at risk for this type of infection. ? Your sexual activity has changed since you were last screened, and you are at increased risk for chlamydia or gonorrhea. Ask your health care provider if   you are at risk.  Ask your health care provider about whether you are at high risk for HIV. Your health care provider may recommend a prescription medicine to help prevent HIV infection. If you choose to take medicine to prevent HIV, you should first get tested for HIV. You should then be tested every 3 months for as long as you are taking the medicine. Pregnancy  If you are about to stop having your period (premenopausal) and  you may become pregnant, seek counseling before you get pregnant.  Take 400 to 800 micrograms (mcg) of folic acid every day if you become pregnant.  Ask for birth control (contraception) if you want to prevent pregnancy. Osteoporosis and menopause Osteoporosis is a disease in which the bones lose minerals and strength with aging. This can result in bone fractures. If you are 65 years old or older, or if you are at risk for osteoporosis and fractures, ask your health care provider if you should:  Be screened for bone loss.  Take a calcium or vitamin D supplement to lower your risk of fractures.  Be given hormone replacement therapy (HRT) to treat symptoms of menopause. Follow these instructions at home: Lifestyle  Do not use any products that contain nicotine or tobacco, such as cigarettes, e-cigarettes, and chewing tobacco. If you need help quitting, ask your health care provider.  Do not use street drugs.  Do not share needles.  Ask your health care provider for help if you need support or information about quitting drugs. Alcohol use  Do not drink alcohol if: ? Your health care provider tells you not to drink. ? You are pregnant, may be pregnant, or are planning to become pregnant.  If you drink alcohol: ? Limit how much you use to 0-1 drink a day. ? Limit intake if you are breastfeeding.  Be aware of how much alcohol is in your drink. In the [place] one drink equals one 12 oz bottle of beer (355 mL), one 5 oz glass of wine (148 mL), or one 1 oz glass of hard liquor (44 mL). General instructions  Schedule regular health, dental, and eye exams.  Stay current with your vaccines.  Tell your health care provider if: ? You often feel depressed. ? You have ever been abused or do not feel safe at home. Summary  Adopting a healthy lifestyle and getting preventive care are important in promoting health and wellness.  Follow your health care provider's instructions about healthy  diet, exercising, and getting tested or screened for diseases.  Follow your health care provider's instructions on monitoring your cholesterol and blood pressure. This information is not intended to replace advice given to you by your health care provider. Make sure you discuss any questions you have with your health care provider. Document Revised: 05/09/2018 Document Reviewed: 05/09/2018 Elsevier Patient Education  2020 Elsevier Inc.  

## 2020-05-15 LAB — CBC
Hematocrit: 38.4 % (ref 34.0–46.6)
Hemoglobin: 11.7 g/dL (ref 11.1–15.9)
MCH: 22.6 pg — ABNORMAL LOW (ref 26.6–33.0)
MCHC: 30.5 g/dL — ABNORMAL LOW (ref 31.5–35.7)
MCV: 74 fL — ABNORMAL LOW (ref 79–97)
Platelets: 394 10*3/uL (ref 150–450)
RBC: 5.17 x10E6/uL (ref 3.77–5.28)
RDW: 14.8 % (ref 11.7–15.4)
WBC: 6.6 10*3/uL (ref 3.4–10.8)

## 2020-05-15 LAB — LIPID PANEL
Chol/HDL Ratio: 3 ratio (ref 0.0–4.4)
Cholesterol, Total: 185 mg/dL (ref 100–199)
HDL: 61 mg/dL (ref >39)
LDL Chol Calc (NIH): 110 mg/dL — ABNORMAL HIGH (ref 0–99)
Triglycerides: 73 mg/dL (ref 0–149)
VLDL Cholesterol Cal: 14 mg/dL (ref 5–40)

## 2020-05-15 LAB — CMP14+EGFR
ALT: 12 IU/L (ref 0–32)
AST: 11 IU/L (ref 0–40)
Albumin/Globulin Ratio: 1.5 (ref 1.2–2.2)
Albumin: 4.1 g/dL (ref 3.9–5.0)
Alkaline Phosphatase: 59 IU/L (ref 44–121)
BUN/Creatinine Ratio: 22 (ref 9–23)
BUN: 13 mg/dL (ref 6–20)
Bilirubin Total: <0.2 mg/dL (ref 0.0–1.2)
CO2: 23 mmol/L (ref 20–29)
Calcium: 9.4 mg/dL (ref 8.7–10.2)
Chloride: 103 mmol/L (ref 96–106)
Creatinine, Ser: 0.58 mg/dL (ref 0.57–1.00)
GFR calc Af Amer: 148 mL/min/{1.73_m2} (ref >59)
GFR calc non Af Amer: 129 mL/min/{1.73_m2} (ref >59)
Globulin, Total: 2.8 g/dL (ref 1.5–4.5)
Glucose: 78 mg/dL (ref 65–99)
Potassium: 4 mmol/L (ref 3.5–5.2)
Sodium: 138 mmol/L (ref 134–144)
Total Protein: 6.9 g/dL (ref 6.0–8.5)

## 2020-05-15 LAB — HEMOGLOBIN A1C
Est. average glucose Bld gHb Est-mCnc: 126 mg/dL
Hgb A1c MFr Bld: 6 % — ABNORMAL HIGH (ref 4.8–5.6)

## 2020-06-15 ENCOUNTER — Ambulatory Visit
Admission: EM | Admit: 2020-06-15 | Discharge: 2020-06-15 | Disposition: A | Discharge status: Home / Self Care | Payer: 59 | Attending: Emergency Medicine | Admitting: Emergency Medicine

## 2020-06-15 ENCOUNTER — Encounter: Payer: Self-pay | Admitting: Internal Medicine

## 2020-06-15 ENCOUNTER — Encounter: Payer: Self-pay | Admitting: Emergency Medicine

## 2020-06-15 ENCOUNTER — Other Ambulatory Visit: Payer: Self-pay

## 2020-06-15 DIAGNOSIS — R6889 Other general symptoms and signs: Secondary | ICD-10-CM | POA: Diagnosis not present

## 2020-06-15 DIAGNOSIS — Z1152 Encounter for screening for COVID-19: Secondary | ICD-10-CM

## 2020-06-15 DIAGNOSIS — J029 Acute pharyngitis, unspecified: Secondary | ICD-10-CM | POA: Diagnosis present

## 2020-06-15 HISTORY — DX: Pure hypercholesterolemia, unspecified: E78.00

## 2020-06-15 HISTORY — DX: Prediabetes: R73.03

## 2020-06-15 LAB — POCT RAPID STREP A (OFFICE): Rapid Strep A Screen: NEGATIVE

## 2020-06-15 NOTE — ED Provider Notes (Signed)
[facility]    CSN: [id number] Arrival date & time: 06/15/20  1311      History   Chief Complaint Chief Complaint  Patient presents with  . Cough  . Sore Throat    HPI Rebecca Nicholson is a 25 y.o. female  History was provided by the patient. Rebecca Nicholson is a 25 y.o. female who presents for evaluation of a sore throat. Associated symptoms include dry cough and sore throat. Onset of symptoms was 2 days ago, unchanged since that time.  She is drinking plenty of fluids. She has not had recent close exposure to someone with proven streptococcal pharyngitis.  Is covid vaccinated. The following portions of the patient's history were reviewed and updated as appropriate: allergies, current medications, past family history, past medical history, past social history, past surgical history and problem list.     Past Medical History:  Diagnosis Date  . Anemia   . Asthma    as child  . Hidradenitis suppurativa of right axilla   . Hypercholesteremia   . Medical history non-contributory   . Moderate persistent asthma without complication 03/14/2018  . Pre-diabetes     Patient Active Problem List   Diagnosis Date Noted  . Weight gain 11/18/2019  . Moderate persistent asthma with acute exacerbation 11/18/2019  . Class 2 obesity due to excess calories without serious comorbidity with body mass index (BMI) of 39.0 to 39.9 in adult 11/18/2019  . Adverse food reaction 03/14/2018  . Chronic rhinitis 03/14/2018  . Moderate persistent asthma without complication 03/14/2018    Past Surgical History:  Procedure Laterality Date  . DENTAL SURGERY    . HYDRADENITIS EXCISION Right 06/10/2014   Procedure: EXCISION OF RIGHT AXILLARY HIDRADENITIS ;  Surgeon: Manus Rudd, MD;  Location: [facility];  Service: General;  Laterality: Right;  . HYDRADENITIS EXCISION Right 04/11/2019   Procedure: EXCISION RIGHT AXILLARY HIDRADENITIS;  Surgeon: Manus Rudd, MD;   Location: [facility];  Service: General;  Laterality: Right;  . TONSILLECTOMY      OB History   No obstetric history on file.      Home Medications    Prior to Admission medications   Medication Sig Start Date End Date Taking? Authorizing Provider  albuterol (VENTOLIN HFA) 108 (90 Base) MCG/ACT inhaler Inhale 2 puffs into the lungs every 6 (six) hours as needed for wheezing. 05/14/20  Yes Dorothyann Peng, MD  budesonide-formoterol [facility]) 80-4.5 MCG/ACT inhaler Inhale 2 puffs into the lungs 2 (two) times daily. 11/10/18  Yes Dorothyann Peng, MD  JUNEL FE 1/20 1-20 MG-MCG tablet Take 1 tablet by mouth daily. 10/30/19  Yes [provider]  montelukast (SINGULAIR) 10 MG tablet Take 1 tablet (10 mg total) by mouth daily. 01/02/20 01/01/21 Yes Dorothyann Peng, MD  albuterol (PROVENTIL) (2.5 MG/3ML) 0.083% nebulizer solution Take 3 mLs (2.5 mg total) by nebulization every 6 (six) hours as needed for wheezing or shortness of breath. 05/14/20   Dorothyann Peng, MD  hyoscyamine (LEVSIN SL) 0.125 MG SL tablet Place 1 tablet (0.125 mg total) under the tongue 2 (two) times daily. 06/11/18 03/05/19  Napoleon Form, MD  medroxyPROGESTERone (DEPO-PROVERA) 150 MG/ML injection Inject 150 mg into the muscle every 3 (three) months.  03/05/19  [provider]    Family History Family History  Problem Relation Age of Onset  . Hypertension Mother   . Diabetes Mother   . Diabetes Maternal Aunt   . Colon cancer Maternal Grandmother   .  Breast cancer Maternal Grandmother   . Cancer Father   . Esophageal cancer Neg Hx   . Rectal cancer Neg Hx   . Stomach cancer Neg Hx     Social History Social History   Tobacco Use  . Smoking status: Never Smoker  . Smokeless tobacco: Never Used  Vaping Use  . Vaping Use: Never used  Substance Use Topics  . Alcohol use: No  . Drug use: No     Allergies   Cephalosporins   Review of Systems Review of Systems  Constitutional:  Negative for fatigue and fever.  HENT: Positive for sore throat. Negative for congestion, dental problem, ear pain, facial swelling, hearing loss, sinus pain, trouble swallowing and voice change.   Eyes: Negative for photophobia, pain and visual disturbance.  Respiratory: Positive for cough. Negative for shortness of breath.   Cardiovascular: Negative for chest pain and palpitations.  Gastrointestinal: Negative for diarrhea and vomiting.  Musculoskeletal: Negative for arthralgias and myalgias.  Neurological: Negative for dizziness and headaches.     Physical Exam Triage Vital Signs ED Triage Vitals  Enc Vitals Group     BP 06/15/20 1401 (!) 141/69     Pulse Rate 06/15/20 1401 (!) 113     Resp 06/15/20 1401 20     Temp 06/15/20 1401 99.6 F (37.6 C)     Temp Source 06/15/20 1401 Oral     SpO2 06/15/20 1401 100 %     Weight 06/15/20 1357 235 lb (106.6 kg)     Height 06/15/20 1357 5\' 7"  (1.702 m)     Head Circumference --      Peak Flow --      Pain Score 06/15/20 1357 5     Pain Loc --      Pain Edu? --      Excl. in GC? --    No data found.  Updated Vital Signs BP (!) 141/69 (BP Location: Left Arm)   Pulse (!) 113   Temp 99.6 F (37.6 C) (Oral)   Resp 20   Ht 5\' 7"  (1.702 m)   Wt 235 lb (106.6 kg)   LMP 04/13/2020 (Approximate)   SpO2 100%   BMI 36.81 kg/m   Visual Acuity Right Eye Distance:   Left Eye Distance:   Bilateral Distance:    Right Eye Near:   Left Eye Near:    Bilateral Near:     Physical Exam Constitutional:      General: She is not in acute distress.    Appearance: She is not ill-appearing or diaphoretic.  HENT:     Head: Normocephalic and atraumatic.     Right Ear: Tympanic membrane and ear canal normal.     Left Ear: Tympanic membrane and ear canal normal.     Mouth/Throat:     Mouth: Mucous membranes are moist.     Pharynx: Oropharynx is clear. No oropharyngeal exudate or posterior oropharyngeal erythema.  Eyes:     General: No  scleral icterus.    Conjunctiva/sclera: Conjunctivae normal.     Pupils: Pupils are equal, round, and reactive to light.  Neck:     Comments: Trachea midline, negative JVD Cardiovascular:     Rate and Rhythm: Regular rhythm. Tachycardia present.     Heart sounds: No murmur heard. No gallop.   Pulmonary:     Effort: Pulmonary effort is normal. No respiratory distress.     Breath sounds: No wheezing, rhonchi or rales.  Musculoskeletal:  Cervical back: Neck supple. No tenderness.  Lymphadenopathy:     Cervical: No cervical adenopathy.  Skin:    Capillary Refill: Capillary refill takes less than 2 seconds.     Coloration: Skin is not jaundiced or pale.     Findings: No rash.  Neurological:     General: No focal deficit present.     Mental Status: She is alert and oriented to person, place, and time.      UC Treatments / Results  Labs (all labs ordered are listed, but only abnormal results are displayed) Labs Reviewed  COVID-19, FLU A+B NAA  CULTURE, GROUP A STREP [facility])  POCT RAPID STREP A (OFFICE)    EKG   Radiology No results found.  Procedures Procedures (including critical care time)  Medications Ordered in UC Medications - No data to display  Initial Impression / Assessment and Plan / UC Course  I have reviewed the triage vital signs and the nursing notes.  Pertinent labs & imaging results that were available during my care of the patient were reviewed by me and considered in my medical decision making (see chart for details).     Patient afebrile, nontoxic, with SpO2 100%.  Rapid strep negative, culture and Covid PCR pending.  Patient to quarantine until results are back.  We will treat supportively as outlined below.  Return precautions discussed, patient verbalized understanding and is agreeable to plan. Final Clinical Impressions(s) / UC Diagnoses   Final diagnoses:  Encounter for screening for COVID-19  Flu-like symptoms  Sore throat      Discharge Instructions     Pain medication:  350 mg-1000 mg of Tylenol (acetaminophen) and/or 200 mg - 800 mg of Advil (ibuprofen, Motrin) every 8 hours as needed.  May alternate between the two throughout the day as they are generally safe to take together.  DO NOT exceed more than 3000 mg of Tylenol or 3200 mg of ibuprofen in a 24 hour period as this could damage your stomach, kidneys, liver, or increase your bleeding risk.    ED Prescriptions    None     PDMP not reviewed this encounter.   Hall[place]Potvin, [place], [place] 06/15/20 1542

## 2020-06-15 NOTE — ED Triage Notes (Signed)
Pt c/o of cough, body aches, sore throat x 2 days.

## 2020-06-15 NOTE — Discharge Instructions (Signed)
Pain medication:  °350 mg-1000 mg of Tylenol (acetaminophen) and/or 200 mg - 800 mg of Advil (ibuprofen, Motrin) every 8 hours as needed.  May alternate between the two throughout the day as they are generally safe to take together.  DO NOT exceed more than 3000 mg of Tylenol or 3200 mg of ibuprofen in a 24 hour period as this could damage your stomach, kidneys, liver, or increase your bleeding risk. °

## 2020-06-16 LAB — COVID-19, FLU A+B NAA
Influenza A, NAA: NOT DETECTED
Influenza B, NAA: NOT DETECTED
SARS-CoV-2, NAA: DETECTED — AB

## 2020-06-18 LAB — CULTURE, GROUP A STREP (THRC)

## 2020-08-27 ENCOUNTER — Encounter: Payer: Self-pay | Admitting: Internal Medicine

## 2020-08-28 DIAGNOSIS — E559 Vitamin D deficiency, unspecified: Secondary | ICD-10-CM | POA: Insufficient documentation

## 2020-10-13 IMAGING — US US ABDOMEN COMPLETE
1 series · 14 of 25 positions shown · non-contrast
Comparison: None.

CLINICAL DATA: Abdominal pain, bloating, and cramping for 1 year.

EXAM:
ABDOMEN ULTRASOUND COMPLETE

[Series 1: us abdomen complete · 14 of 82 slices shown]
[im 1/82]
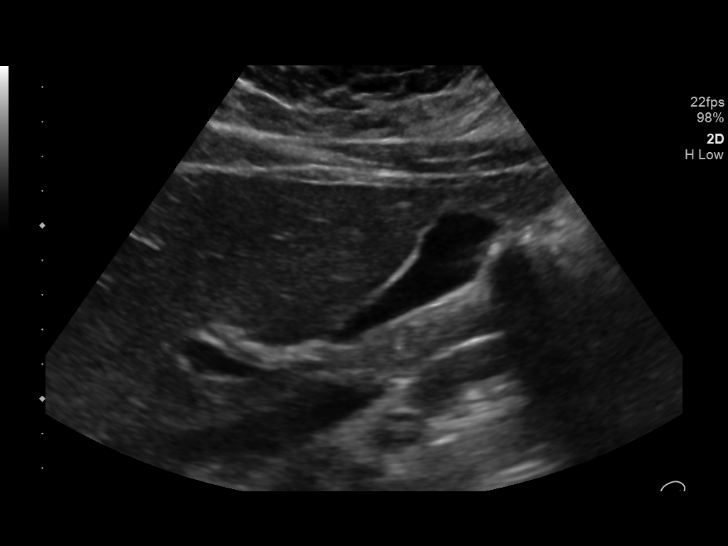
[im 7/82]
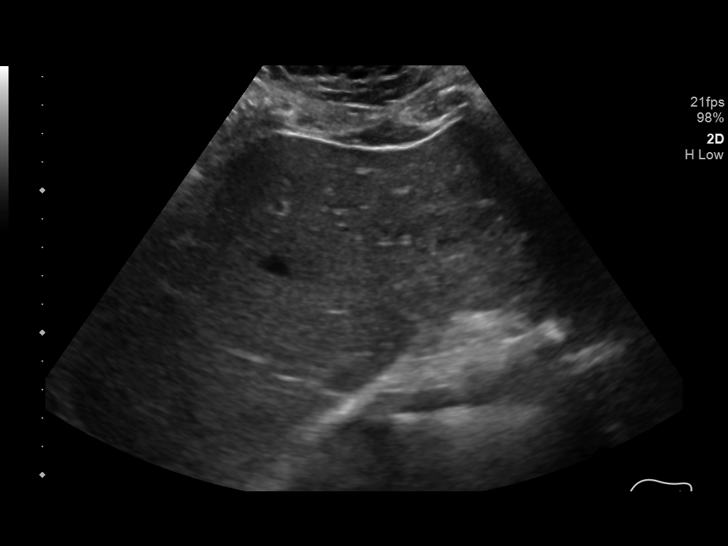
[im 14/82]
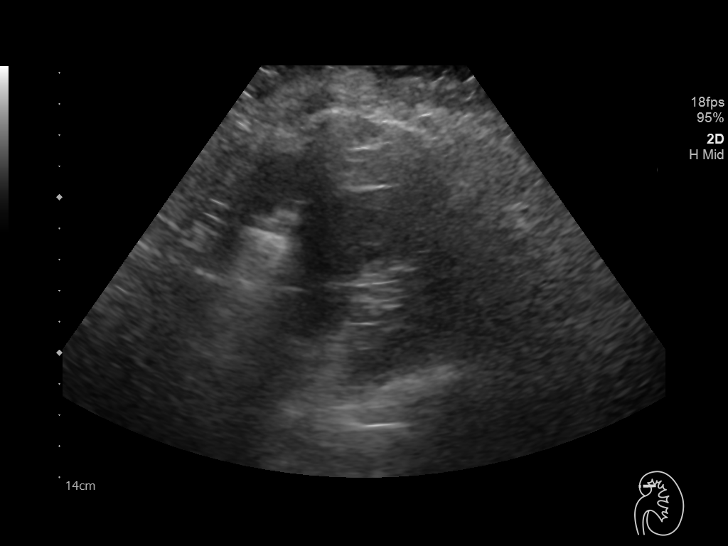
[im 21/82]
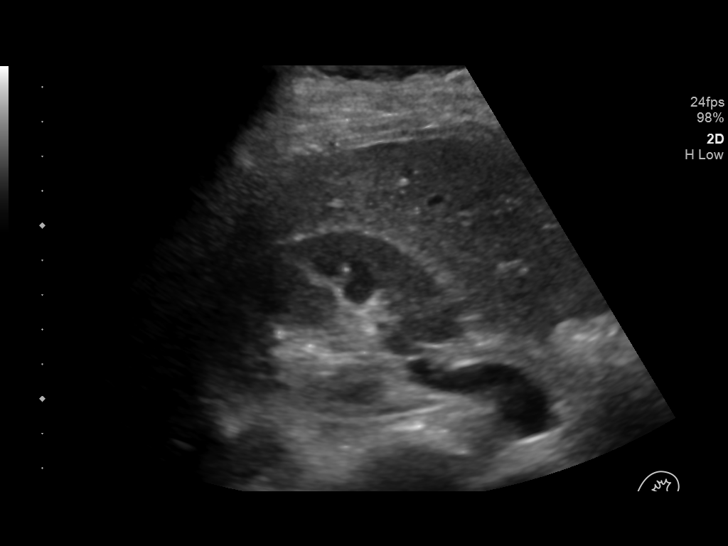
[im 28/82]
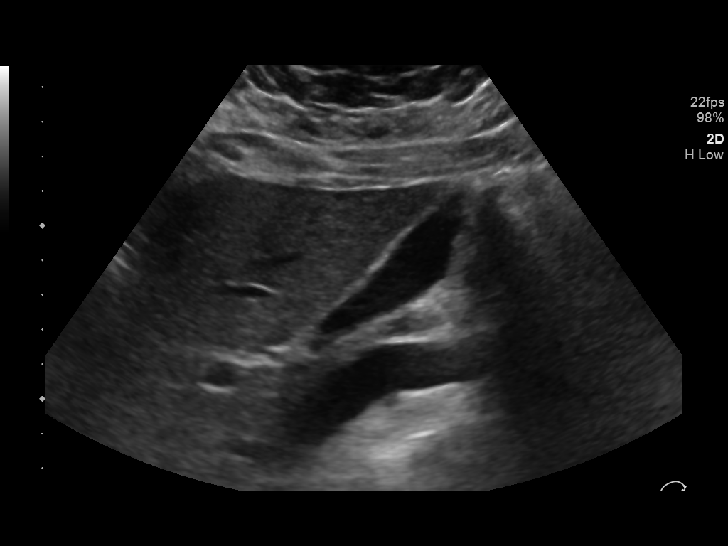
[im 31/82]
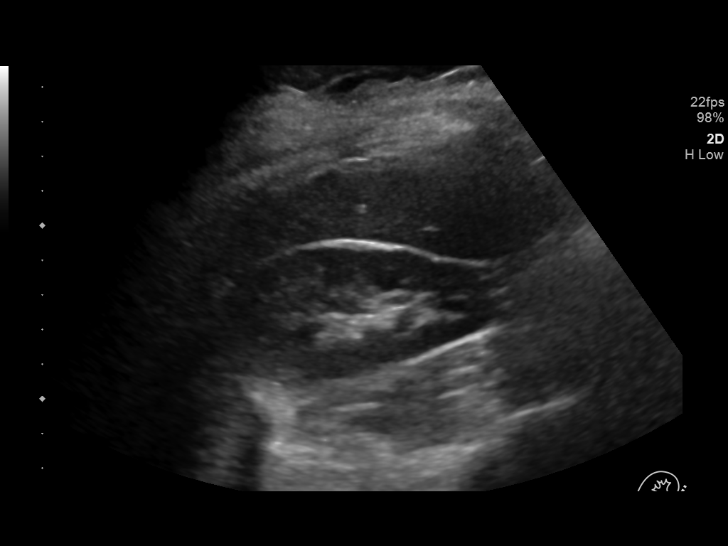
[im 38/82]
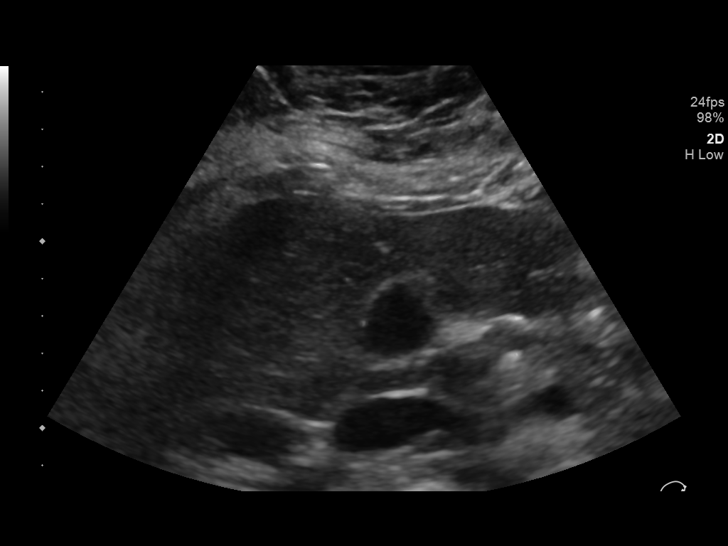
[im 44/82]
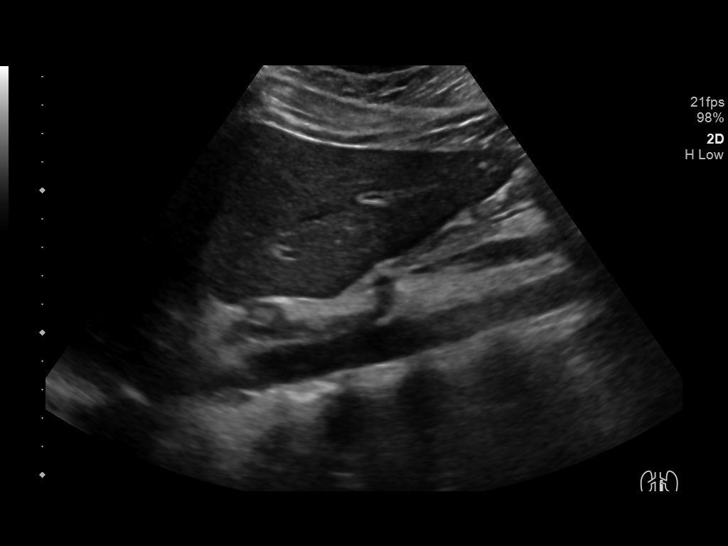
[im 51/82]
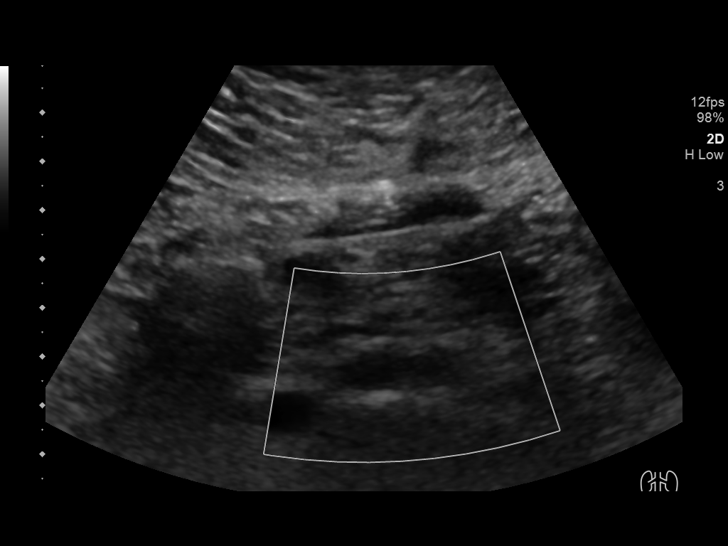
[im 55/82]
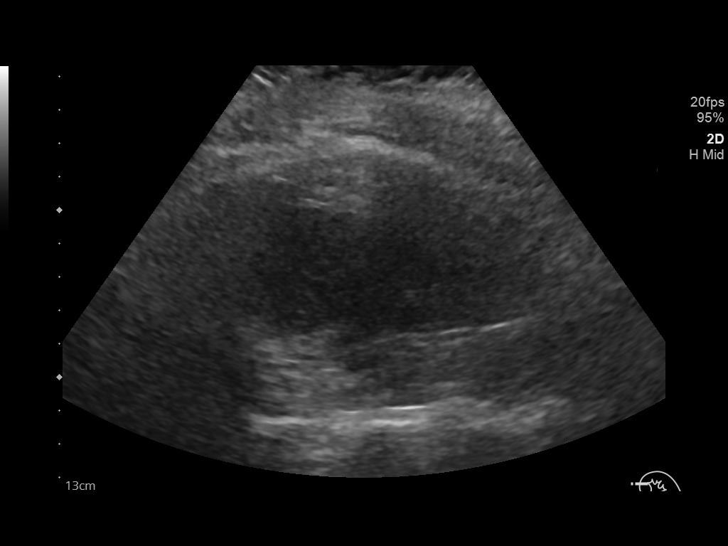
[im 61/82]
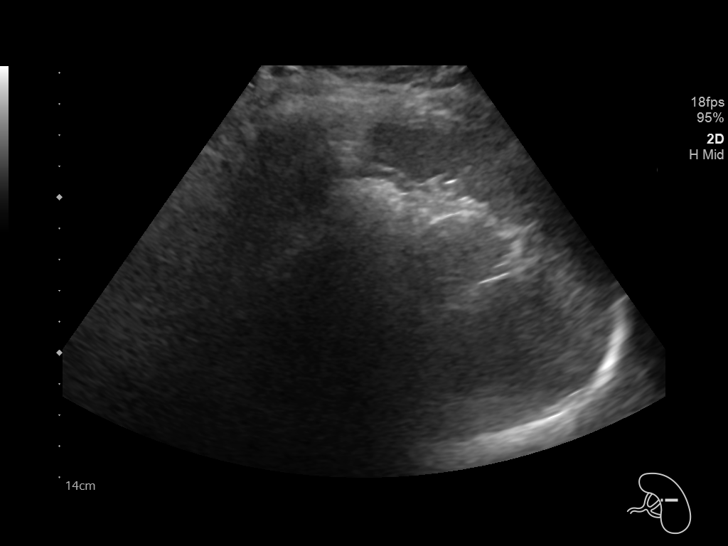
[im 68/82]
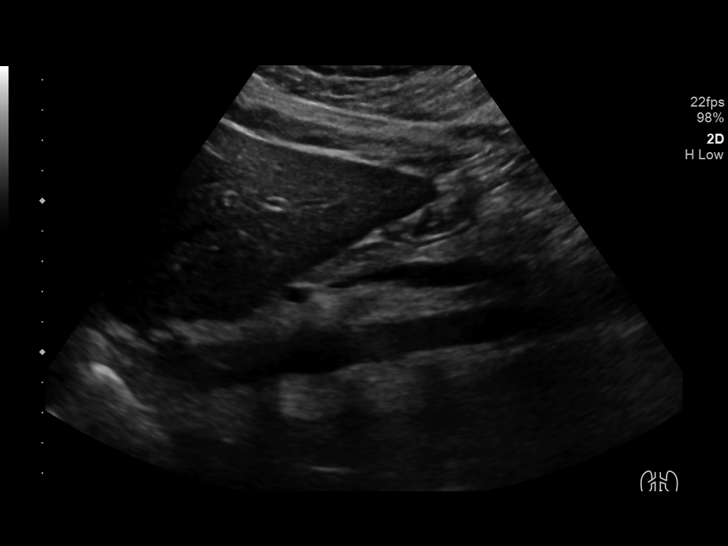
[im 75/82]
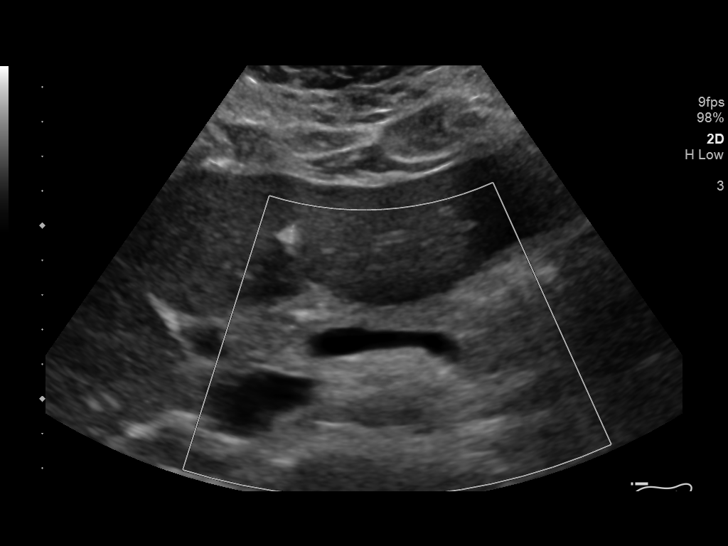
[im 82/82]
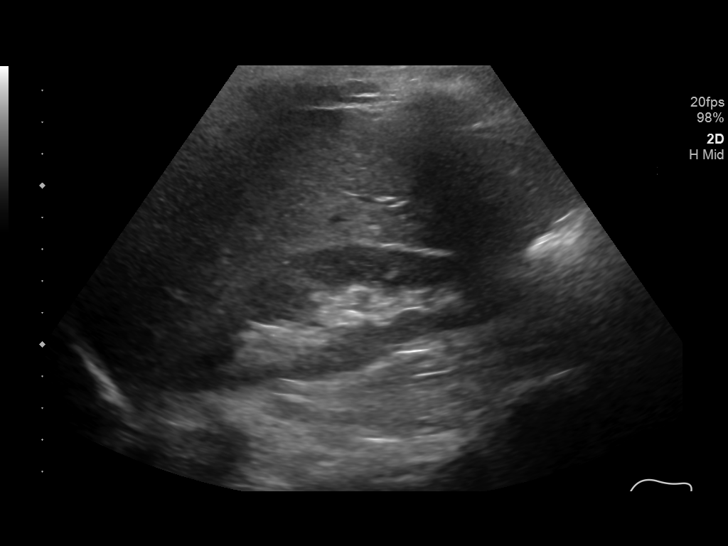

[14 of 25 positions shown; findings below may reference images not displayed]

FINDINGS: Gallbladder: No gallstones or wall thickening visualized. No
sonographic Murphy sign noted by sonographer.

Common bile duct: Diameter: 3 mm, within normal limits.

Liver: No focal lesion identified. Within normal limits in
parenchymal echogenicity. Portal vein is patent on color Doppler
imaging with normal direction of blood flow towards the liver.

IVC: No abnormality visualized.

Pancreas: Visualized portion unremarkable.

Spleen: Size and appearance within normal limits.

Right Kidney: Length: 10.8 cm. Echogenicity within normal limits. No
mass or hydronephrosis visualized.

Left Kidney: Length: 11.4 cm. Echogenicity within normal limits. No
mass or hydronephrosis visualized.

Abdominal aorta: No aneurysm visualized.

Other findings: None.
IMPRESSION: Normal abdomen ultrasound.

## 2020-10-15 ENCOUNTER — Other Ambulatory Visit: Payer: Self-pay

## 2020-10-15 ENCOUNTER — Encounter (HOSPITAL_COMMUNITY): Payer: Self-pay | Admitting: Emergency Medicine

## 2020-10-15 ENCOUNTER — Emergency Department (HOSPITAL_COMMUNITY)
Admission: EM | Admit: 2020-10-15 | Discharge: 2020-10-15 | Disposition: A | Discharge status: Home / Self Care | Payer: 59 | Attending: Emergency Medicine | Admitting: Emergency Medicine

## 2020-10-15 DIAGNOSIS — W260XXA Contact with knife, initial encounter: Secondary | ICD-10-CM | POA: Diagnosis not present

## 2020-10-15 DIAGNOSIS — J4531 Mild persistent asthma with (acute) exacerbation: Secondary | ICD-10-CM | POA: Diagnosis not present

## 2020-10-15 DIAGNOSIS — Z7951 Long term (current) use of inhaled steroids: Secondary | ICD-10-CM | POA: Diagnosis not present

## 2020-10-15 DIAGNOSIS — S6992XA Unspecified injury of left wrist, hand and finger(s), initial encounter: Secondary | ICD-10-CM | POA: Diagnosis present

## 2020-10-15 DIAGNOSIS — S61211A Laceration without foreign body of left index finger without damage to nail, initial encounter: Secondary | ICD-10-CM | POA: Diagnosis not present

## 2020-10-15 DIAGNOSIS — Y990 Civilian activity done for income or pay: Secondary | ICD-10-CM | POA: Insufficient documentation

## 2020-10-15 MED ORDER — LIDOCAINE-EPINEPHRINE-TETRACAINE (LET) TOPICAL GEL
3.0000 mL | Freq: Once | TOPICAL | Status: AC
  Administered 2020-10-15: 3 mL via TOPICAL
  Filled 2020-10-15: qty 3

## 2020-10-15 MED ORDER — LIDOCAINE HCL 2 % IJ SOLN: 10.0000 mL | Freq: Once | INTRAMUSCULAR | Status: DC

## 2020-10-15 NOTE — ED Provider Notes (Signed)
[facility]-EMERGENCY DEPT Provider Note   CSN: [id number] Arrival date & time: 10/15/20  2109     History Chief Complaint  Patient presents with  . Finger Injury    Rebecca Nicholson is a 25 y.o. female.  The history is provided by the patient. No language interpreter was used.     25 year old female presenting for evaluation of finger injury.  Patient states while at work today she accidentally cut her left index finger with a knife while trying to cut bread.  She reported cute onset of sharp pain to the affected area.  Pain is minimal without any numbness.  She is unable to recall last tetanus status.  Aside from wrapping up the finger with a dressing no other specific treatment tried.  She is right-hand dominant.  Past Medical History:  Diagnosis Date  . Anemia   . Asthma    as child  . Hidradenitis suppurativa of right axilla   . Hypercholesteremia   . Medical history non-contributory   . Moderate persistent asthma without complication 03/14/2018  . Pre-diabetes     Patient Active Problem List   Diagnosis Date Noted  . Weight gain 11/18/2019  . Moderate persistent asthma with acute exacerbation 11/18/2019  . Class 2 obesity due to excess calories without serious comorbidity with body mass index (BMI) of 39.0 to 39.9 in adult 11/18/2019  . Adverse food reaction 03/14/2018  . Chronic rhinitis 03/14/2018  . Moderate persistent asthma without complication 03/14/2018    Past Surgical History:  Procedure Laterality Date  . DENTAL SURGERY    . HYDRADENITIS EXCISION Right 06/10/2014   Procedure: EXCISION OF RIGHT AXILLARY HIDRADENITIS ;  Surgeon: Manus Rudd, MD;  Location: [facility];  Service: General;  Laterality: Right;  . HYDRADENITIS EXCISION Right 04/11/2019   Procedure: EXCISION RIGHT AXILLARY HIDRADENITIS;  Surgeon: Manus Rudd, MD;  Location: [facility];  Service: General;  Laterality: Right;  .  TONSILLECTOMY       OB History   No obstetric history on file.     Family History  Problem Relation Age of Onset  . Hypertension Mother   . Diabetes Mother   . Diabetes Maternal Aunt   . Colon cancer Maternal Grandmother   . Breast cancer Maternal Grandmother   . Cancer Father   . Esophageal cancer Neg Hx   . Rectal cancer Neg Hx   . Stomach cancer Neg Hx     Social History   Tobacco Use  . Smoking status: Never Smoker  . Smokeless tobacco: Never Used  Vaping Use  . Vaping Use: Never used  Substance Use Topics  . Alcohol use: No  . Drug use: No    Home Medications Prior to Admission medications   Medication Sig Start Date End Date Taking? Authorizing Provider  albuterol (PROVENTIL) (2.5 MG/3ML) 0.083% nebulizer solution Take 3 mLs (2.5 mg total) by nebulization every 6 (six) hours as needed for wheezing or shortness of breath. 05/14/20   Dorothyann Peng, MD  albuterol (VENTOLIN HFA) 108 (90 Base) MCG/ACT inhaler Inhale 2 puffs into the lungs every 6 (six) hours as needed for wheezing. 05/14/20   Dorothyann Peng, MD  budesonide-formoterol [facility]) 80-4.5 MCG/ACT inhaler Inhale 2 puffs into the lungs 2 (two) times daily. 11/10/18   Dorothyann Peng, MD  JUNEL FE 1/20 1-20 MG-MCG tablet Take 1 tablet by mouth daily. 10/30/19   [provider]  montelukast (SINGULAIR) 10 MG tablet Take 1 tablet (10  mg total) by mouth daily. 01/02/20 01/01/21  Dorothyann Peng, MD  hyoscyamine (LEVSIN SL) 0.125 MG SL tablet Place 1 tablet (0.125 mg total) under the tongue 2 (two) times daily. 06/11/18 03/05/19  Napoleon Form, MD  medroxyPROGESTERone (DEPO-PROVERA) 150 MG/ML injection Inject 150 mg into the muscle every 3 (three) months.  03/05/19  [provider]    Allergies    Cephalosporins  Review of Systems   Review of Systems  Constitutional: Negative for fever.  Skin: Positive for wound.    Physical Exam Updated Vital Signs BP 135/81 (BP Location: Left Arm)    Pulse 79   Temp 99.1 F (37.3 C) (Oral)   Resp 19   Ht 5\' 7"  (1.702 m)   Wt 108.9 kg   SpO2 99%   BMI 37.59 kg/m   Physical Exam Vitals and nursing note reviewed.  Constitutional:      General: She is not in acute distress.    Appearance: She is well-developed.  HENT:     Head: Atraumatic.  Eyes:     Conjunctiva/sclera: Conjunctivae normal.  Musculoskeletal:        General: Signs of injury (Left index finger: 1 cm superficial oblique laceration noted to the pad of the finger without any joint involvement and no foreign body noted.  Sensation intact with brisk cap refill.) present.     Cervical back: Neck supple.  Skin:    Findings: No rash.  Neurological:     Mental Status: She is alert.     ED Results / Procedures / Treatments   Labs (all labs ordered are listed, but only abnormal results are displayed) Labs Reviewed - No data to display  EKG None  Radiology No results found.  Procedures . Laceration Repair  Date/Time: 10/15/2020 10:02 PM Performed by: 10/17/2020, PA-C Authorized by: Fayrene Helper, PA-C   Consent:    Consent obtained:  Verbal   Consent given by:  Patient   Risks discussed:  Infection, need for additional repair, pain, poor cosmetic result and poor wound healing   Alternatives discussed:  No treatment and delayed treatment Universal protocol:    Procedure explained and questions answered to patient or proxy's satisfaction: yes     Relevant documents present and verified: yes     Test results available: yes     Imaging studies available: yes     Required blood products, implants, devices, and special equipment available: yes     Site/side marked: yes     Immediately prior to procedure, a time out was called: yes     Patient identity confirmed:  Verbally with patient Anesthesia:    Anesthesia method:  Local infiltration and topical application   Topical anesthetic:  LET Laceration details:    Location:  Finger   Finger location:  L index  finger   Length (cm):  1   Depth (mm):  2 Exploration:    Limited defect created (wound extended): no     Hemostasis achieved with:  LET   Wound exploration: wound explored through full range of motion and entire depth of wound visualized     Wound extent: no muscle damage noted, no underlying fracture noted and no vascular damage noted     Contaminated: no   Treatment:    Debridement:  None   Undermining:  None Skin repair:    Repair method:  Tissue adhesive Approximation:    Approximation:  Loose Repair type:    Repair type:  Simple Post-procedure details:  Dressing:  Open (no dressing)   Procedure completion:  Tolerated well, no immediate complications     Medications Ordered in ED Medications  lidocaine-EPINEPHrine-tetracaine (LET) topical gel (has no administration in time range)    ED Course  I have reviewed the triage vital signs and the nursing notes.  Pertinent labs & imaging results that were available during my care of the patient were reviewed by me and considered in my medical decision making (see chart for details).    MDM Rules/Calculators/A&P                          BP 135/81 (BP Location: Left Arm)   Pulse 79   Temp 99.1 F (37.3 C) (Oral)   Resp 19   Ht 5\' 7"  (1.702 m)   Wt 108.9 kg   SpO2 99%   BMI 37.59 kg/m   Final Clinical Impression(s) / ED Diagnoses Final diagnoses:  Laceration of left index finger, initial encounter    Rx / DC Orders ED Discharge Orders    None     Patient here with injury to her left index finger.  She has a very superficial laceration that can be cleansed and Dermabond applied without the need for suture repair.  Will update tetanus   , PA-C 10/15/20 2203    [place], 2204, DO 10/15/20 2327

## 2020-10-15 NOTE — Discharge Instructions (Signed)
Your wound was treated with sterile skin glue.  Keep wound dry for the first 24 hrs.  Monitor for any signs of infection.  Take OTC tylenol or ibuprofen as needed for pain.

## 2020-10-15 NOTE — ED Triage Notes (Signed)
Patient presents with a laceration to the left  Index finger. While cutting bread at work, she accidentally cut her finger.

## 2020-11-16 ENCOUNTER — Ambulatory Visit: Payer: 59 | Admitting: Internal Medicine

## 2020-11-16 ENCOUNTER — Encounter: Payer: Self-pay | Admitting: Internal Medicine

## 2020-11-16 ENCOUNTER — Other Ambulatory Visit: Payer: Self-pay

## 2020-11-16 VITALS — BP 116/80 | HR 69 | Temp 98.3°F | Ht 67.0 in | Wt 239.4 lb

## 2020-11-16 DIAGNOSIS — R7309 Other abnormal glucose: Secondary | ICD-10-CM | POA: Diagnosis not present

## 2020-11-16 DIAGNOSIS — N920 Excessive and frequent menstruation with regular cycle: Secondary | ICD-10-CM | POA: Diagnosis not present

## 2020-11-16 DIAGNOSIS — J4540 Moderate persistent asthma, uncomplicated: Secondary | ICD-10-CM

## 2020-11-16 DIAGNOSIS — Z6837 Body mass index (BMI) 37.0-37.9, adult: Secondary | ICD-10-CM

## 2020-11-16 DIAGNOSIS — E6609 Other obesity due to excess calories: Secondary | ICD-10-CM | POA: Diagnosis not present

## 2020-11-16 NOTE — Progress Notes (Signed)
I,Katawbba Wiggins,acting as a Education administrator for Maximino Greenland, MD.,have documented all relevant documentation on the behalf of Maximino Greenland, MD,as directed by  Maximino Greenland, MD while in the presence of Maximino Greenland, MD.  This visit occurred during the SARS-CoV-2 public health emergency.  Safety protocols were in place, including screening questions prior to the visit, additional usage of staff PPE, and extensive cleaning of exam room while observing appropriate contact time as indicated for disinfecting solutions.  Subjective:     Patient ID: Rebecca Nicholson , female    DOB: 06-12-1995 , 25 y.o.   MRN: [id number]   Chief Complaint  Patient presents with   Asthma    HPI  She presents today for asthma f/u. She denies having to use her nebulizer since her last attack in April. States this was triggered by running after a child at her job. She works at [employer]. She had to run after him both inside/outside of the building.   Asthma There is no chest tightness or cough. This is a chronic problem. The current episode started more than 1 year ago. Her symptoms are alleviated by beta-agonist. She reports significant improvement on treatment. Her past medical history is significant for asthma.    Past Medical History:  Diagnosis Date   Anemia    Asthma    as child   Hidradenitis suppurativa of right axilla    Hypercholesteremia    Medical history non-contributory    Moderate persistent asthma without complication 50/93/2671   Pre-diabetes      Family History  Problem Relation Age of Onset   Hypertension Mother    Diabetes Mother    Diabetes Maternal Aunt    Colon cancer Maternal Grandmother    Breast cancer Maternal Grandmother    Cancer Father    Esophageal cancer Neg Hx    Rectal cancer Neg Hx    Stomach cancer Neg Hx      Current Outpatient Medications:    albuterol (PROVENTIL) (2.5 MG/3ML) 0.083% nebulizer solution, Take 3 mLs (2.5 mg total) by  nebulization every 6 (six) hours as needed for wheezing or shortness of breath., Disp: 30 mL, Rfl: 12   albuterol (VENTOLIN HFA) 108 (90 Base) MCG/ACT inhaler, Inhale 2 puffs into the lungs every 6 (six) hours as needed for wheezing., Disp: 18 g, Rfl: 6   budesonide-formoterol (SYMBICORT) 80-4.5 MCG/ACT inhaler, Inhale 2 puffs into the lungs 2 (two) times daily., Disp: 1 Inhaler, Rfl: 11   JUNEL FE 1/20 1-20 MG-MCG tablet, Take 1 tablet by mouth daily., Disp: , Rfl:    montelukast (SINGULAIR) 10 MG tablet, Take 1 tablet (10 mg total) by mouth daily., Disp: 90 tablet, Rfl: 2   Allergies  Allergen Reactions   Cephalosporins Rash     Review of Systems  Constitutional: Negative.   Respiratory: Negative.  Negative for cough.   Cardiovascular: Negative.   Gastrointestinal: Negative.   Genitourinary:        She c/o heavy cycles. She is followed by GYN.   Psychiatric/Behavioral: Negative.    All other systems reviewed and are negative.   Today's Vitals   11/16/20 1049  BP: 116/80  Pulse: 69  Temp: 98.3 F (36.8 C)  TempSrc: Oral  SpO2: 99%  Weight: 239 lb 6.4 oz (108.6 kg)  Height: 5' 7" (1.702 m)   Body mass index is 37.5 kg/m.  Wt Readings from Last 3 Encounters:  11/16/20 239 lb 6.4 oz (108.6 kg)  10/15/20 240 lb (108.9 kg)  06/15/20 235 lb (106.6 kg)    BP Readings from Last 3 Encounters:  11/16/20 116/80  10/15/20 135/81  06/15/20 (!) 141/69    Objective:  Physical Exam Vitals and nursing note reviewed.  Constitutional:      Appearance: Normal appearance. She is obese.  HENT:     Head: Normocephalic and atraumatic.     Nose:     Comments: Masked     Mouth/Throat:     Comments: Masked  Cardiovascular:     Rate and Rhythm: Normal rate and regular rhythm.     Heart sounds: Normal heart sounds.  Pulmonary:     Effort: Pulmonary effort is normal.     Breath sounds: Normal breath sounds.  Musculoskeletal:     Cervical back: Normal range of motion.  Skin:     General: Skin is warm.  Neurological:     General: No focal deficit present.     Mental Status: She is alert.  Psychiatric:        Mood and Affect: Mood normal.        Behavior: Behavior normal.        Assessment And Plan:     1. Moderate persistent asthma without complication Comments: Chronic, stable. She will c/w current meds. Encouraged to let me know if she is using albuterol more than twice weekly.   2. Menorrhagia with regular cycle Comments: She is encouraged to f/u with GYN. This is sx of estrogen dominance, should choose hormone free meat when available. Cutting back on sugar and increasing intake of cruciferous veggies including broccoli, I will check iron levels.  - CBC no Diff - Iron and IBC (CPT-83540,83550) - Ferritin  3. Other abnormal glucose Comments: Her a1c has been elevated in the past. I will recheck this today. Advised to limit intake of sweetened beverages, including diet drinks.  - Hemoglobin A1c - BMP8+eGFR - Insulin, random(561)  4. Class 2 obesity due to excess calories without serious comorbidity with body mass index (BMI) of 37.0 to 37.9 in adult  She is encouraged to strive for BMI less than 30 to decrease cardiac risk. Advised to aim for at least 150 minutes of exercise per week. She is encouraged to walk laps in the bowling alley during her breaks.  Patient was given opportunity to ask questions. Patient verbalized understanding of the plan and was able to repeat key elements of the plan. All questions were answered to their satisfaction.   I, Robyn N Sanders, MD, have reviewed all documentation for this visit. The documentation on 11/16/20 for the exam, diagnosis, procedures, and orders are all accurate and complete.   IF YOU HAVE BEEN REFERRED TO A SPECIALIST, IT MAY TAKE 1-2 WEEKS TO SCHEDULE/PROCESS THE REFERRAL. IF YOU HAVE NOT HEARD FROM US/SPECIALIST IN TWO WEEKS, PLEASE GIVE US A CALL AT [phone].   THE PATIENT IS ENCOURAGED TO  PRACTICE SOCIAL DISTANCING DUE TO THE COVID-19 PANDEMIC.    

## 2020-11-17 LAB — IRON AND TIBC
Iron Saturation: 11 % — ABNORMAL LOW (ref 15–55)
Iron: 37 ug/dL (ref 27–159)
Total Iron Binding Capacity: 341 ug/dL (ref 250–450)
UIBC: 304 ug/dL (ref 131–425)

## 2020-11-17 LAB — BMP8+EGFR
BUN/Creatinine Ratio: 17 (ref 9–23)
BUN: 11 mg/dL (ref 6–20)
CO2: 24 mmol/L (ref 20–29)
Calcium: 9.9 mg/dL (ref 8.7–10.2)
Chloride: 101 mmol/L (ref 96–106)
Creatinine, Ser: 0.65 mg/dL (ref 0.57–1.00)
Glucose: 92 mg/dL (ref 65–99)
Potassium: 3.9 mmol/L (ref 3.5–5.2)
Sodium: 139 mmol/L (ref 134–144)
eGFR: 125 mL/min/{1.73_m2} (ref >59)

## 2020-11-17 LAB — CBC
Hematocrit: 37.3 % (ref 34.0–46.6)
Hemoglobin: 11.9 g/dL (ref 11.1–15.9)
MCH: 23 pg — ABNORMAL LOW (ref 26.6–33.0)
MCHC: 31.9 g/dL (ref 31.5–35.7)
MCV: 72 fL — ABNORMAL LOW (ref 79–97)
Platelets: 381 10*3/uL (ref 150–450)
RBC: 5.18 x10E6/uL (ref 3.77–5.28)
RDW: 14.4 % (ref 11.7–15.4)
WBC: 6.8 10*3/uL (ref 3.4–10.8)

## 2020-11-17 LAB — INSULIN, RANDOM: INSULIN: 17.4 u[IU]/mL (ref 2.6–24.9)

## 2020-11-17 LAB — HEMOGLOBIN A1C
Est. average glucose Bld gHb Est-mCnc: 126 mg/dL
Hgb A1c MFr Bld: 6 % — ABNORMAL HIGH (ref 4.8–5.6)

## 2020-11-17 LAB — FERRITIN: Ferritin: 27 ng/mL (ref 15–150)

## 2020-12-23 ENCOUNTER — Encounter: Payer: Self-pay | Admitting: Internal Medicine

## 2020-12-29 ENCOUNTER — Other Ambulatory Visit: Payer: Self-pay

## 2020-12-29 ENCOUNTER — Ambulatory Visit: Payer: 59

## 2020-12-29 ENCOUNTER — Ambulatory Visit (INDEPENDENT_AMBULATORY_CARE_PROVIDER_SITE_OTHER): Payer: 59

## 2020-12-29 VITALS — BP 122/80 | HR 74 | Temp 98.6°F | Ht 67.0 in | Wt 234.8 lb

## 2020-12-29 DIAGNOSIS — Z0289 Encounter for other administrative examinations: Secondary | ICD-10-CM

## 2020-12-29 DIAGNOSIS — Z111 Encounter for screening for respiratory tuberculosis: Secondary | ICD-10-CM | POA: Diagnosis not present

## 2020-12-29 NOTE — Progress Notes (Signed)
Patient presents today for a TB skin test [facility]

## 2020-12-31 ENCOUNTER — Other Ambulatory Visit: Payer: Self-pay

## 2020-12-31 ENCOUNTER — Encounter: Payer: Self-pay | Admitting: Nurse Practitioner

## 2020-12-31 DIAGNOSIS — Z111 Encounter for screening for respiratory tuberculosis: Secondary | ICD-10-CM

## 2020-12-31 NOTE — Progress Notes (Unsigned)
tb 

## 2021-01-04 ENCOUNTER — Encounter: Payer: Self-pay | Admitting: Internal Medicine

## 2021-01-26 LAB — TB SKIN TEST
Induration: 0 mm
TB Skin Test: NEGATIVE

## 2021-01-26 NOTE — Addendum Note (Signed)
Addended by: Harle Battiest on: 01/26/2021 05:27 PM   Modules accepted: Orders

## 2021-04-19 ENCOUNTER — Telehealth: Payer: 59 | Admitting: Nurse Practitioner

## 2021-04-19 DIAGNOSIS — R6889 Other general symptoms and signs: Secondary | ICD-10-CM

## 2021-04-19 MED ORDER — XOFLUZA (40 MG DOSE) 1 X 40 MG PO TBPK: 1.0000 | ORAL_TABLET | Freq: Once | ORAL | 0 refills | Status: AC

## 2021-04-19 NOTE — Patient Instructions (Signed)
Influenza, Adult °Influenza is also called "the flu." It is an infection in the lungs, nose, and throat (respiratory tract). It spreads easily from person to person (is contagious). The flu causes symptoms that are like a cold, along with high fever and body aches. °What are the causes? °This condition is caused by the influenza virus. You can get the virus by: °Breathing in droplets that are in the air after a person infected with the flu coughed or sneezed. °Touching something that has the virus on it and then touching your mouth, nose, or eyes. °What increases the risk? °Certain things may make you more likely to get the flu. These include: °Not washing your hands often. °Having close contact with many people during cold and flu season. °Touching your mouth, eyes, or nose without first washing your hands. °Not getting a flu shot every year. °You may have a higher risk for the flu, and serious problems, such as a lung infection (pneumonia), if you: °Are older than 65. °Are pregnant. °Have a weakened disease-fighting system (immune system) because of a disease or because you are taking certain medicines. °Have a long-term (chronic) condition, such as: °Heart, kidney, or lung disease. °Diabetes. °Asthma. °Have a liver disorder. °Are very overweight (morbidly obese). °Have anemia. °What are the signs or symptoms? °Symptoms usually begin suddenly and last 4-14 days. They may include: °Fever and chills. °Headaches, body aches, or muscle aches. °Sore throat. °Cough. °Runny or stuffy (congested) nose. °Feeling discomfort in your chest. °Not wanting to eat as much as normal. °Feeling weak or tired. °Feeling dizzy. °Feeling sick to your stomach or throwing up. °How is this treated? °If the flu is found early, you can be treated with antiviral medicine. This can help to reduce how bad the illness is and how long it lasts. This may be given by mouth or through an IV tube. °Taking care of yourself at home can help your  symptoms get better. Your doctor may want you to: °Take over-the-counter medicines. °Drink plenty of fluids. °The flu often goes away on its own. If you have very bad symptoms or other problems, you may be treated in a hospital. °Follow these instructions at home: °  °Activity °Rest as needed. Get plenty of sleep. °Stay home from work or school as told by your doctor. °Do not leave home until you do not have a fever for 24 hours without taking medicine. °Leave home only to go to your doctor. °Eating and drinking °Take an ORS (oral rehydration solution). This is a drink that is sold at pharmacies and stores. °Drink enough fluid to keep your pee pale yellow. °Drink clear fluids in small amounts as you are able. Clear fluids include: °Water. °Ice chips. °Fruit juice mixed with water. °Low-calorie sports drinks. °Eat bland foods that are easy to digest. Eat small amounts as you are able. These foods include: °Bananas. °Applesauce. °Rice. °Lean meats. °Toast. °Crackers. °Do not eat or drink: °Fluids that have a lot of sugar or caffeine. °Alcohol. °Spicy or fatty foods. °General instructions °Take over-the-counter and prescription medicines only as told by your doctor. °Use a cool mist humidifier to add moisture to the air in your home. This can make it easier for you to breathe. °When using a cool mist humidifier, clean it daily. Empty water and replace with clean water. °Cover your mouth and nose when you cough or sneeze. °Wash your hands with soap and water often and for at least 20 seconds. This is also important after   you cough or sneeze. If you cannot use soap and water, use alcohol-based hand sanitizer. °Keep all follow-up visits. °How is this prevented? ° °Get a flu shot every year. You may get the flu shot in late summer, fall, or winter. Ask your doctor when you should get your flu shot. °Avoid contact with people who are sick during fall and winter. This is cold and flu season. °Contact a doctor if: °You get  new symptoms. °You have: °Chest pain. °Watery poop (diarrhea). °A fever. °Your cough gets worse. °You start to have more mucus. °You feel sick to your stomach. °You throw up. °Get help right away if you: °Have shortness of breath. °Have trouble breathing. °Have skin or nails that turn a bluish color. °Have very bad pain or stiffness in your neck. °Get a sudden headache. °Get sudden pain in your face or ear. °Cannot eat or drink without throwing up. °These symptoms may represent a serious problem that is an emergency. Get medical help right away. Call your local emergency services (911 in the [place] °Do not wait to see if the symptoms will go away. °Do not drive yourself to the hospital. °Summary °Influenza is also called "the flu." It is an infection in the lungs, nose, and throat. It spreads easily from person to person. °Take over-the-counter and prescription medicines only as told by your doctor. °Getting a flu shot every year is the best way to not get the flu. °This information is not intended to replace advice given to you by your health care provider. Make sure you discuss any questions you have with your health care provider. °Document Revised: 01/03/2020 Document Reviewed: 01/03/2020 °Elsevier Patient Education © 2022 [employer] ° °

## 2021-04-19 NOTE — Progress Notes (Signed)
Virtual Visit Consent   Rebecca Nicholson, you are scheduled for a virtual visit with Mary-Margaret Daphine Deutscher, FNP, a [facility] provider, today.     Just as with appointments in the office, your consent must be obtained to participate.  Your consent will be active for this visit and any virtual visit you may have with one of our providers in the next 365 days.     If you have a MyChart account, a copy of this consent can be sent to you electronically.  All virtual visits are billed to your insurance company just like a traditional visit in the office.    As this is a virtual visit, video technology does not allow for your provider to perform a traditional examination.  This may limit your provider's ability to fully assess your condition.  If your provider identifies any concerns that need to be evaluated in person or the need to arrange testing (such as labs, EKG, etc.), we will make arrangements to do so.     Although advances in technology are sophisticated, we cannot ensure that it will always work on either your end or our end.  If the connection with a video visit is poor, the visit may have to be switched to a telephone visit.  With either a video or telephone visit, we are not always able to ensure that we have a secure connection.     I need to obtain your verbal consent now.   Are you willing to proceed with your visit today? YES   Rebecca Nicholson has provided verbal consent on 04/19/2021 for a virtual visit (video or telephone).   Mary-Margaret Daphine Deutscher, FNP   Date: 04/19/2021 5:02 PM   Virtual Visit via Video Note   I, Mary-Margaret Daphine Deutscher, connected with Rebecca Nicholson ([id number], 05-11-1996) on 04/19/21 at  5:00 PM EST by a video-enabled telemedicine application and verified that I am speaking with the correct person using two identifiers.  Location: Patient: Virtual Visit Location Patient: Home sister is with her Provider: Virtual Visit Location Provider: Mobile    I discussed the limitations of evaluation and management by telemedicine and the availability of in person appointments. The patient expressed understanding and agreed to proceed.    History of Present Illness: Rebecca Nicholson is a 25 y.o. who identifies as a female who was assigned female at birth, and is being seen today for body aches.  HPI: Patient says that today she developed sore throat , body aches, chills and temp 102.2. Was around a lot of people over the weekend. May have had flu exposure      Review of Systems  Constitutional:  Positive for chills, fever and malaise/fatigue.  HENT:  Positive for congestion and sore throat.   Respiratory:  Positive for cough. Negative for sputum production and shortness of breath.   Musculoskeletal:  Positive for myalgias.  Neurological:  Positive for headaches. Negative for dizziness.   Problems:  Patient Active Problem List   Diagnosis Date Noted   Weight gain 11/18/2019   Moderate persistent asthma with acute exacerbation 11/18/2019   Class 2 obesity due to excess calories without serious comorbidity with body mass index (BMI) of 39.0 to 39.9 in adult 11/18/2019   Adverse food reaction 03/14/2018   Chronic rhinitis 03/14/2018   Moderate persistent asthma without complication 03/14/2018    Allergies:  Allergies  Allergen Reactions   Cephalosporins Rash   Medications:  Current Outpatient Medications:    albuterol (PROVENTIL) (  2.5 MG/3ML) 0.083% nebulizer solution, Take 3 mLs (2.5 mg total) by nebulization every 6 (six) hours as needed for wheezing or shortness of breath., Disp: 30 mL, Rfl: 12   albuterol (VENTOLIN HFA) 108 (90 Base) MCG/ACT inhaler, Inhale 2 puffs into the lungs every 6 (six) hours as needed for wheezing., Disp: 18 g, Rfl: 6   budesonide-formoterol (SYMBICORT) 80-4.5 MCG/ACT inhaler, Inhale 2 puffs into the lungs 2 (two) times daily., Disp: 1 Inhaler, Rfl: 11   JUNEL FE 1/20 1-20 MG-MCG tablet, Take 1 tablet by  mouth daily., Disp: , Rfl:    montelukast (SINGULAIR) 10 MG tablet, Take 1 tablet (10 mg total) by mouth daily., Disp: 90 tablet, Rfl: 2  Observations/Objective: Patient is well-developed, well-nourished in no acute distress.  Resting comfortably  at home.  Head is normocephalic, atraumatic.  No labored breathing.  Speech is clear and coherent with logical content.  Patient is alert and oriented at baseline.  Chills while talking Slight cough noted  Assessment and Plan:  Rebecca Nicholson in today with chief complaint of No chief complaint on file.   1. Flu-like symptoms 1. Take meds as prescribed 2. Use a cool mist humidifier especially during the winter months and when heat has been humid. 3. Use saline nose sprays frequently 4. Saline irrigations of the nose can be very helpful if done frequently.  * 4X daily for 1 week*  * Use of a nettie pot can be helpful with this. Follow directions with this* 5. Drink plenty of fluids 6. Keep thermostat turn down low 7.For any cough or congestion  Use plain Mucinex- regular strength or max strength is fine   * Children- consult with Pharmacist for dosing 8. For fever or aces or pains- take tylenol or ibuprofen appropriate for age and weight.  * for fevers greater than 101 orally you may alternate ibuprofen and tylenol every  3 hours.    - Baloxavir Marboxil,40 MG Dose, (XOFLUZA, 40 MG DOSE,) 1 x 40 MG TBPK; Take 1 tablet by mouth once for 1 dose.  Dispense: 1 each; Refill: 0    Follow Up Instructions: I discussed the assessment and treatment plan with the patient. The patient was provided an opportunity to ask questions and all were answered. The patient agreed with the plan and demonstrated an understanding of the instructions.  A copy of instructions were sent to the patient via MyChart.  The patient was advised to call back or seek an in-person evaluation if the symptoms worsen or if the condition fails to improve as  anticipated.  Time:  I spent 12 minutes with the patient via telehealth technology discussing the above problems/concerns.    Mary-Margaret Daphine Deutscher, FNP

## 2021-04-20 ENCOUNTER — Other Ambulatory Visit: Payer: Self-pay

## 2021-04-20 ENCOUNTER — Ambulatory Visit: Payer: Self-pay | Admitting: *Deleted

## 2021-04-20 ENCOUNTER — Emergency Department (HOSPITAL_COMMUNITY)
Admission: EM | Admit: 2021-04-20 | Discharge: 2021-04-20 | Disposition: A | Discharge status: Home / Self Care | Payer: 59 | Attending: Emergency Medicine | Admitting: Emergency Medicine

## 2021-04-20 ENCOUNTER — Encounter (HOSPITAL_COMMUNITY): Payer: Self-pay

## 2021-04-20 DIAGNOSIS — Z7951 Long term (current) use of inhaled steroids: Secondary | ICD-10-CM | POA: Diagnosis not present

## 2021-04-20 DIAGNOSIS — R000 Tachycardia, unspecified: Secondary | ICD-10-CM | POA: Insufficient documentation

## 2021-04-20 DIAGNOSIS — J4541 Moderate persistent asthma with (acute) exacerbation: Secondary | ICD-10-CM | POA: Insufficient documentation

## 2021-04-20 DIAGNOSIS — J3489 Other specified disorders of nose and nasal sinuses: Secondary | ICD-10-CM | POA: Diagnosis not present

## 2021-04-20 DIAGNOSIS — Z20822 Contact with and (suspected) exposure to covid-19: Secondary | ICD-10-CM | POA: Diagnosis not present

## 2021-04-20 DIAGNOSIS — R509 Fever, unspecified: Secondary | ICD-10-CM | POA: Diagnosis present

## 2021-04-20 DIAGNOSIS — J101 Influenza due to other identified influenza virus with other respiratory manifestations: Secondary | ICD-10-CM | POA: Diagnosis not present

## 2021-04-20 LAB — RESP PANEL BY RT-PCR (FLU A&B, COVID) ARPGX2
Influenza A by PCR: POSITIVE — AB
Influenza B by PCR: NEGATIVE
SARS Coronavirus 2 by RT PCR: NEGATIVE

## 2021-04-20 MED ORDER — ACETAMINOPHEN 500 MG PO TABS
1000.0000 mg | ORAL_TABLET | Freq: Once | ORAL | Status: AC
  Administered 2021-04-20: 1000 mg via ORAL
  Filled 2021-04-20: qty 2

## 2021-04-20 MED ORDER — LIDOCAINE VISCOUS HCL 2 % MT SOLN: 15.0000 mL | OROMUCOSAL | 0 refills | Status: DC | PRN

## 2021-04-20 MED ORDER — SODIUM CHLORIDE 0.9 % IV BOLUS
1000.0000 mL | Freq: Once | INTRAVENOUS | Status: AC
  Administered 2021-04-20: 1000 mL via INTRAVENOUS

## 2021-04-20 MED ORDER — LIDOCAINE VISCOUS HCL 2 % MT SOLN
15.0000 mL | Freq: Once | OROMUCOSAL | Status: AC
  Administered 2021-04-20: 15 mL via OROMUCOSAL
  Filled 2021-04-20 (×2): qty 15

## 2021-04-20 MED ORDER — BENZONATATE 100 MG PO CAPS
200.0000 mg | ORAL_CAPSULE | Freq: Once | ORAL | Status: AC
  Administered 2021-04-20: 200 mg via ORAL
  Filled 2021-04-20: qty 2

## 2021-04-20 MED ORDER — BENZONATATE 100 MG PO CAPS: 200.0000 mg | ORAL_CAPSULE | Freq: Three times a day (TID) | ORAL | 0 refills | Status: DC

## 2021-04-20 NOTE — ED Triage Notes (Signed)
Pt c/o fever, chills, body aches and fever. Pt states she had tele doc visit yesterday and was diagnosed with the flu. Pt states she has been alternating tylenol and ibuprofen with no relief.

## 2021-04-20 NOTE — Discharge Instructions (Addendum)
You came to the emergency department today to be evaluated for your flulike illness.  You were positive for influenza.    With the flu your are considered infectious until you are fever free for 24 hours.  You can alternate Tylenol/acetaminophen and Advil/ibuprofen/Motrin every 4 hours for sore throat, body aches, headache or fever.  Do not take more than 3,000 mg tylenol in a 24 hour period.  Please check all medication labels as many medications such as pain and cold medications may contain tylenol.  Do not drink alcohol while taking these medications.  Do not take other NSAID'S while taking ibuprofen (such as aleve or naproxen).  Please take ibuprofen with food to decrease stomach upset.  Please make sure to stay well hydrated.  Drink plenty of water or watered down sports drinks.  If drinking sports drinks please stay away from red colored drinks; as they can cause confusion for bleeding if you vomit.   Please make sure to practice good hand hygiene to help prevent the spread of flu.  You may use saline nasal spray for congestion.  Can use over-the-counter cough medication to help with your cough.  Follow up with your primary care provider if symptoms persist.  Return to the ER for inability to swallow liquids, difficulty breathing, or new or worsening symptoms.

## 2021-04-20 NOTE — Telephone Encounter (Signed)
C/o elevated temp 103 now . Dx with flu via VV yesterday during My Chart VV. Patient 's sister, Isabelle Course  called in for patient but with sister and able to answer questions. C/o headache, sore throat, runny nose, body aches, dry cough and fever. Patient took motrin and tylenol alternating for fever and too tylenol approx 30 minutes ago. Xofluza ordered and patient reports her mother was unable to get it from pharmacy due to cost of $150. Hx asthma and can not take tamiflu. Instructed patient's sister to call pharmacy and see if any alternative to medication or good Rx available to assist with purchase of medication due to 48 hour window to take medication. Reviewed if temp does not come down go to ED for further assistance. Can try to cool down with luke warm water or cool compress to forehead and neck. Patient reports she is staying hydrated. Encouraged patient to contact PCP as well. Care advise given. Patient and sister verbalized understanding of care advise and to call back and go to ED if temp does not decrease.

## 2021-04-20 NOTE — Telephone Encounter (Signed)
Reason for Disposition  [1] Fever > 100.0 F (37.8 C) AND [2] diabetes mellitus or weak immune system (e.g., HIV positive, cancer chemo, splenectomy, organ transplant, chronic steroids)  Answer Assessment - Initial Assessment Questions 1. WORST SYMPTOM: "What is your worst symptom?" (e.g., cough, runny nose, muscle aches, headache, sore throat, fever)      Fever 103 now , headache , runny nose, body aches, sore throat per sister  2. ONSET: "When did your flu symptoms start?"      Na  3. COUGH: "How bad is the cough?"       Dry cough  4. RESPIRATORY DISTRESS: "Describe your breathing."      Ok  5. FEVER: "Do you have a fever?" If Yes, ask: "What is your temperature, how was it measured, and when did it start?"     Yes 103 not  6. EXPOSURE: "Were you exposed to someone with influenza?"       na 7. FLU VACCINE: "Did you get a flu shot this year?"     na 8. HIGH RISK DISEASE: "Do you have any chronic medical problems?" (e.g., heart or lung disease, asthma, weak immune system, or other HIGH RISK conditions)     Hx asthma  9. PREGNANCY: "Is there any chance you are pregnant?" "When was your last menstrual period?"     On menstrual cycle now 10. OTHER SYMPTOMS: "Do you have any other symptoms?"  (e.g., runny nose, muscle aches, headache, sore throat)       Runny nose, headache, body aches, fever sore throat  Protocols used: Influenza - [facility]

## 2021-04-20 NOTE — ED Provider Notes (Signed)
[facility]-EMERGENCY DEPT Provider Note   CSN: [id number] Arrival date & time: 04/20/21  1644     History Chief Complaint  Patient presents with   Fever   Chills   Generalized Body Aches    Rebecca Nicholson is a 25 y.o. female presents to the emergency department with a chief complaint of flulike symptoms.  Patient reports that her symptoms started Monday morning.  Patient endorses sore throat, fever, chills, nonproductive cough, rhinorrhea, and headache.  Patient reports that she has been able to tolerate p.o. intake without difficulty.  Denies any trouble swallowing, drooling, trismus, high potato voice.  Patient reports that headache onset is gradual pain progressively worse over time.  Pain is located to frontotemporal aspect of head.  Pain is worse with bright lights.  Pain has improved with Tylenol and Motrin.  Patient denies any visual disturbance, numbness, weakness, facial asymmetry, dysarthria.  Patient reports that she has been taking Tylenol and Motrin every 3-4 hours to help with her symptoms.  Last had Motrin approximately 1500 this afternoon.  Patient was prescribed XOFLUZA by her PCP yesterday however unable to pick up this medication due to cost.  Patient states that she has had bad reactions to Tamiflu in the past.  Patient states that she has albuterol inhaler at home   Fever Associated symptoms: chills, cough, headaches, myalgias (Generalized) and sore throat   Associated symptoms: no chest pain, no confusion, no congestion, no diarrhea, no nausea, no rash, no rhinorrhea and no vomiting       Past Medical History:  Diagnosis Date   Anemia    Asthma    as child   Hidradenitis suppurativa of right axilla    Hypercholesteremia    Medical history non-contributory    Moderate persistent asthma without complication 03/14/2018   Pre-diabetes     Patient Active Problem List   Diagnosis Date Noted   Weight gain 11/18/2019   Moderate  persistent asthma with acute exacerbation 11/18/2019   Class 2 obesity due to excess calories without serious comorbidity with body mass index (BMI) of 39.0 to 39.9 in adult 11/18/2019   Adverse food reaction 03/14/2018   Chronic rhinitis 03/14/2018   Moderate persistent asthma without complication 03/14/2018    Past Surgical History:  Procedure Laterality Date   DENTAL SURGERY     HYDRADENITIS EXCISION Right 06/10/2014   Procedure: EXCISION OF RIGHT AXILLARY HIDRADENITIS ;  Surgeon: Manus Rudd, MD;  Location: [facility];  Service: General;  Laterality: Right;   HYDRADENITIS EXCISION Right 04/11/2019   Procedure: EXCISION RIGHT AXILLARY HIDRADENITIS;  Surgeon: Manus Rudd, MD;  Location: [facility];  Service: General;  Laterality: Right;   TONSILLECTOMY       OB History   No obstetric history on file.     Family History  Problem Relation Age of Onset   Hypertension Mother    Diabetes Mother    Diabetes Maternal Aunt    Colon cancer Maternal Grandmother    Breast cancer Maternal Grandmother    Cancer Father    Esophageal cancer Neg Hx    Rectal cancer Neg Hx    Stomach cancer Neg Hx     Social History   Tobacco Use   Smoking status: Never   Smokeless tobacco: Never  Vaping Use   Vaping Use: Never used  Substance Use Topics   Alcohol use: No   Drug use: No    Home Medications Prior to Admission medications  Medication Sig Start Date End Date Taking? Authorizing Provider  albuterol (PROVENTIL) (2.5 MG/3ML) 0.083% nebulizer solution Take 3 mLs (2.5 mg total) by nebulization every 6 (six) hours as needed for wheezing or shortness of breath. 05/14/20   Glendale Chard, MD  albuterol (VENTOLIN HFA) 108 (90 Base) MCG/ACT inhaler Inhale 2 puffs into the lungs every 6 (six) hours as needed for wheezing. 05/14/20   Glendale Chard, MD  budesonide-formoterol [facility]) 80-4.5 MCG/ACT inhaler Inhale 2 puffs into the lungs 2 (two) times daily.  11/10/18   Glendale Chard, MD  JUNEL FE 1/20 1-20 MG-MCG tablet Take 1 tablet by mouth daily. 10/30/19   [provider]  montelukast (SINGULAIR) 10 MG tablet Take 1 tablet (10 mg total) by mouth daily. 01/02/20 01/01/21  Glendale Chard, MD  hyoscyamine (LEVSIN SL) 0.125 MG SL tablet Place 1 tablet (0.125 mg total) under the tongue 2 (two) times daily. 06/11/18 03/05/19  Mauri Pole, MD  medroxyPROGESTERone (DEPO-PROVERA) 150 MG/ML injection Inject 150 mg into the muscle every 3 (three) months.  03/05/19  [provider]    Allergies    Cephalosporins  Review of Systems   Review of Systems  Constitutional:  Positive for chills and fever.  HENT:  Positive for sore throat. Negative for congestion, drooling, rhinorrhea, trouble swallowing and voice change.   Eyes:  Negative for visual disturbance.  Respiratory:  Positive for cough. Negative for shortness of breath.   Cardiovascular:  Negative for chest pain.  Gastrointestinal:  Negative for abdominal pain, diarrhea, nausea and vomiting.  Musculoskeletal:  Positive for myalgias (Generalized). Negative for back pain and neck pain.  Skin:  Negative for color change and rash.  Neurological:  Positive for headaches. Negative for dizziness, tremors, seizures, syncope, facial asymmetry, speech difficulty, weakness, light-headedness and numbness.  Psychiatric/Behavioral:  Negative for confusion.    Physical Exam Updated Vital Signs BP (!) 142/88 (BP Location: Left Arm)   Pulse (!) 119   Temp (!) 103.2 F (39.6 C) (Oral)   Resp 18   SpO2 99%   Physical Exam Vitals and nursing note reviewed.  Constitutional:      General: She is not in acute distress.    Appearance: She is not ill-appearing, toxic-appearing or diaphoretic.  HENT:     Head: Normocephalic. No right periorbital erythema or left periorbital erythema.     Jaw: No trismus, swelling, pain on movement or malocclusion.     Mouth/Throat:     Lips: Pink. No lesions.      Mouth: Mucous membranes are moist. No injury.     Tongue: No lesions. Tongue does not deviate from midline.     Palate: No mass and lesions.     Pharynx: Oropharynx is clear. Uvula midline. Posterior oropharyngeal erythema present. No pharyngeal swelling, oropharyngeal exudate or uvula swelling.     Tonsils: 0 on the right. 0 on the left.     Comments: Handles oral secretions without difficulty Eyes:     General: No scleral icterus.       Right eye: No discharge.        Left eye: No discharge.     Extraocular Movements: Extraocular movements intact.     Conjunctiva/sclera: Conjunctivae normal.     Pupils: Pupils are equal, round, and reactive to light.  Neck:     Comments: No swelling to submandibular space Cardiovascular:     Rate and Rhythm: Tachycardia present.  Pulmonary:     Effort: Pulmonary effort is normal. No tachypnea,  bradypnea or respiratory distress.     Breath sounds: Normal breath sounds. No stridor.     Comments: Speaks in full sentences without difficulty Abdominal:     General: There is no distension. There are no signs of injury.     Palpations: Abdomen is soft. There is no mass or pulsatile mass.     Tenderness: There is no abdominal tenderness. There is no guarding or rebound.  Musculoskeletal:     Cervical back: Full passive range of motion without pain, normal range of motion and neck supple. No edema, erythema, signs of trauma, rigidity, torticollis or crepitus. No pain with movement. Normal range of motion.  Skin:    General: Skin is warm and dry.  Neurological:     General: No focal deficit present.     Mental Status: She is alert.     GCS: GCS eye subscore is 4. GCS verbal subscore is 5. GCS motor subscore is 6.     Cranial Nerves: Cranial nerves 2-12 are intact. No cranial nerve deficit, dysarthria or facial asymmetry.     Comments: Patient moves all limbs equally without difficulty.  Psychiatric:        Behavior: Behavior is cooperative.    ED  Results / Procedures / Treatments   Labs (all labs ordered are listed, but only abnormal results are displayed) Labs Reviewed  RESP PANEL BY RT-PCR (FLU A&B, COVID) ARPGX2 - Abnormal; Notable for the following components:      Result Value   Influenza A by PCR POSITIVE (*)    All other components within normal limits    EKG None  Radiology No results found.  Procedures Procedures   Medications Ordered in ED Medications  acetaminophen (TYLENOL) tablet 1,000 mg (1,000 mg Oral Given 04/20/21 1725)  sodium chloride 0.9 % bolus 1,000 mL (0 mLs Intravenous Stopped 04/20/21 1818)    ED Course  I have reviewed the triage vital signs and the nursing notes.  Pertinent labs & imaging results that were available during my care of the patient were reviewed by me and considered in my medical decision making (see chart for details).    MDM Rules/Calculators/A&P                           Alert 25 year old female no acute distress, nontoxic-appearing.  Presents to ED with chief complaint of flulike illness.  Patient symptoms started Monday morning.  Patient noted to be febrile and tachycardic upon arrival.  Patient was given Tylenol and IV fluids.  Patient had improvement in temperature and tachycardia.  Patient has erythema to oropharynx however no swelling or exudate.  Patient able to tolerate p.o. intake without difficulty.  No swelling to submandibular space.  No peritonsillar abscess.  No pain with passive range of motion of neck.  Low suspicion for Ludwick's angina or deep space neck infection at this time.  Lungs clear to auscultation bilaterally, low suspicion for pneumonia at this time.  Abdomen soft, nondistended, nontender.  Patient positive for influenza A.  Advised patient on symptomatic treatment.  Reviewed ibuprofen and Tylenol use every 4 hours.  We will give patient prescription for viscous lidocaine and Tessalon.  Patient to follow-up with primary care provider if  symptoms do not improve.  Discussed results, findings, treatment and follow up. Patient advised of return precautions. Patient verbalized understanding and agreed with plan.   Final Clinical Impression(s) / ED Diagnoses Final diagnoses:  None  Rx / DC Orders ED Discharge Orders     None        Berneice Heinrich 04/20/21 1846    Gwyneth Sprout, MD 04/20/21 2255

## 2021-05-17 ENCOUNTER — Other Ambulatory Visit: Payer: Self-pay

## 2021-05-17 ENCOUNTER — Encounter: Payer: Self-pay | Admitting: Nurse Practitioner

## 2021-05-17 ENCOUNTER — Ambulatory Visit (INDEPENDENT_AMBULATORY_CARE_PROVIDER_SITE_OTHER): Payer: 59 | Admitting: Nurse Practitioner

## 2021-05-17 VITALS — BP 120/60 | HR 76 | Temp 99.0°F | Ht 66.8 in | Wt 246.4 lb

## 2021-05-17 DIAGNOSIS — J4540 Moderate persistent asthma, uncomplicated: Secondary | ICD-10-CM | POA: Diagnosis not present

## 2021-05-17 DIAGNOSIS — Z0000 Encounter for general adult medical examination without abnormal findings: Secondary | ICD-10-CM

## 2021-05-17 DIAGNOSIS — R7309 Other abnormal glucose: Secondary | ICD-10-CM

## 2021-05-17 DIAGNOSIS — L0292 Furuncle, unspecified: Secondary | ICD-10-CM | POA: Diagnosis not present

## 2021-05-17 DIAGNOSIS — E6609 Other obesity due to excess calories: Secondary | ICD-10-CM | POA: Diagnosis not present

## 2021-05-17 DIAGNOSIS — Z23 Encounter for immunization: Secondary | ICD-10-CM

## 2021-05-17 DIAGNOSIS — Z6838 Body mass index (BMI) 38.0-38.9, adult: Secondary | ICD-10-CM

## 2021-05-17 LAB — CMP14+EGFR
ALT: 11 IU/L (ref 0–32)
AST: 14 IU/L (ref 0–40)
Albumin/Globulin Ratio: 1.6 (ref 1.2–2.2)
Albumin: 4.1 g/dL (ref 3.9–5.0)
Alkaline Phosphatase: 47 IU/L (ref 44–121)
BUN/Creatinine Ratio: 22 (ref 9–23)
BUN: 15 mg/dL (ref 6–20)
Bilirubin Total: 0.2 mg/dL (ref 0.0–1.2)
CO2: 25 mmol/L (ref 20–29)
Calcium: 9.7 mg/dL (ref 8.7–10.2)
Chloride: 101 mmol/L (ref 96–106)
Creatinine, Ser: 0.67 mg/dL (ref 0.57–1.00)
Globulin, Total: 2.6 g/dL (ref 1.5–4.5)
Glucose: 84 mg/dL (ref 70–99)
Potassium: 4 mmol/L (ref 3.5–5.2)
Sodium: 138 mmol/L (ref 134–144)
Total Protein: 6.7 g/dL (ref 6.0–8.5)
eGFR: 124 mL/min/{1.73_m2} (ref >59)

## 2021-05-17 LAB — CBC
Hematocrit: 35.9 % (ref 34.0–46.6)
Hemoglobin: 11.4 g/dL (ref 11.1–15.9)
MCH: 23.8 pg — ABNORMAL LOW (ref 26.6–33.0)
MCHC: 31.8 g/dL (ref 31.5–35.7)
MCV: 75 fL — ABNORMAL LOW (ref 79–97)
Platelets: 327 10*3/uL (ref 150–450)
RBC: 4.79 x10E6/uL (ref 3.77–5.28)
RDW: 15.6 % — ABNORMAL HIGH (ref 11.7–15.4)
WBC: 6.2 10*3/uL (ref 3.4–10.8)

## 2021-05-17 LAB — LIPID PANEL
Chol/HDL Ratio: 3.5 ratio (ref 0.0–4.4)
Cholesterol, Total: 197 mg/dL (ref 100–199)
HDL: 57 mg/dL (ref >39)
LDL Chol Calc (NIH): 126 mg/dL — ABNORMAL HIGH (ref 0–99)
Triglycerides: 79 mg/dL (ref 0–149)
VLDL Cholesterol Cal: 14 mg/dL (ref 5–40)

## 2021-05-17 LAB — HEMOGLOBIN A1C
Est. average glucose Bld gHb Est-mCnc: 123 mg/dL
Hgb A1c MFr Bld: 5.9 % — ABNORMAL HIGH (ref 4.8–5.6)

## 2021-05-17 MED ORDER — BUDESONIDE-FORMOTEROL FUMARATE 80-4.5 MCG/ACT IN AERO: 2.0000 | INHALATION_SPRAY | Freq: Every day | RESPIRATORY_TRACT | 11 refills | Status: DC

## 2021-05-17 MED ORDER — ALBUTEROL SULFATE HFA 108 (90 BASE) MCG/ACT IN AERS: 2.0000 | INHALATION_SPRAY | Freq: Four times a day (QID) | RESPIRATORY_TRACT | 6 refills | Status: DC | PRN

## 2021-05-17 MED ORDER — ALBUTEROL SULFATE (2.5 MG/3ML) 0.083% IN NEBU: 2.5000 mg | INHALATION_SOLUTION | Freq: Four times a day (QID) | RESPIRATORY_TRACT | 12 refills | Status: DC | PRN

## 2021-05-17 MED ORDER — DOXYCYCLINE HYCLATE 100 MG PO TABS: 100.0000 mg | ORAL_TABLET | Freq: Two times a day (BID) | ORAL | 0 refills | Status: DC

## 2021-05-17 NOTE — Patient Instructions (Signed)

## 2021-05-17 NOTE — Progress Notes (Signed)
I,Tianna Badgett,acting as a Education administrator for [employer], FNP.,have documented all relevant documentation on the behalf of Minette Brine, FNP,as directed by  Minette Brine, FNP while in the presence of Minette Brine, [place].  This visit occurred during the SARS-CoV-2 public health emergency.  Safety protocols were in place, including screening questions prior to the visit, additional usage of staff PPE, and extensive cleaning of exam room while observing appropriate contact time as indicated for disinfecting solutions.  Subjective:     Patient ID: Rebecca Nicholson , female    DOB: 10/06/1995 , 25 y.o.   MRN: [id number]   Chief Complaint  Patient presents with   Annual Exam    HPI  She is here today for a full physical examination. She is followed by Earnstine Regal, PA for her GYN exams. She would like a flu shot but was diagnosed with the flu at the end of November and her temp is 99 today. She has a lingering cough. She has been using her breathing treatment at night.   Wt Readings from Last 3 Encounters: 05/17/21 : 246 lb 6.4 oz (111.8 kg) 12/29/20 : 234 lb 12.8 oz (106.5 kg) 11/16/20 : 239 lb 6.4 oz (108.6 kg)      Past Medical History:  Diagnosis Date   Anemia    Asthma    as child   Hidradenitis suppurativa of right axilla    Hypercholesteremia    Medical history non-contributory    Moderate persistent asthma without complication 83/25/4982   Pre-diabetes      Family History  Problem Relation Age of Onset   Hypertension Mother    Diabetes Mother    Diabetes Maternal Aunt    Colon cancer Maternal Grandmother    Breast cancer Maternal Grandmother    Cancer Father    Esophageal cancer Neg Hx    Rectal cancer Neg Hx    Stomach cancer Neg Hx      Current Outpatient Medications:    doxycycline (VIBRA-TABS) 100 MG tablet, Take 1 tablet (100 mg total) by mouth 2 (two) times daily., Disp: 14 tablet, Rfl: 0   JUNEL FE 1/20 1-20 MG-MCG tablet, Take 1 tablet by mouth daily.,  Disp: , Rfl:    albuterol (PROVENTIL) (2.5 MG/3ML) 0.083% nebulizer solution, Take 3 mLs (2.5 mg total) by nebulization every 6 (six) hours as needed for wheezing or shortness of breath., Disp: 30 mL, Rfl: 12   albuterol (VENTOLIN HFA) 108 (90 Base) MCG/ACT inhaler, Inhale 2 puffs into the lungs every 6 (six) hours as needed for wheezing., Disp: 18 g, Rfl: 6   budesonide-formoterol (SYMBICORT) 80-4.5 MCG/ACT inhaler, Inhale 2 puffs into the lungs daily., Disp: 1 each, Rfl: 11   montelukast (SINGULAIR) 10 MG tablet, Take 1 tablet (10 mg total) by mouth daily., Disp: 90 tablet, Rfl: 2   Allergies  Allergen Reactions   Cephalosporins Rash      The patient states she uses OCP (estrogen/progesterone) for birth control. Patient's last menstrual period was 05/04/2021 (exact date).. Negative for Dysmenorrhea and Negative for Menorrhagia. Negative for: breast discharge, breast lump(s), breast pain and breast self exam. Associated symptoms include abnormal vaginal bleeding. Pertinent negatives include abnormal bleeding (hematology), anxiety, decreased libido, depression, difficulty falling sleep, dyspareunia, history of infertility, nocturia, sexual dysfunction, sleep disturbances, urinary incontinence, urinary urgency, vaginal discharge and vaginal itching. Diet regular; she does eat fried foods and increased carb intake. The patient states her exercise level is none. She works 10a-6pm and by the time she  gest off she is tired, has not been consistent. She is an Location manager at [employer].   The patient's tobacco use is:  Social History   Tobacco Use  Smoking Status Never  Smokeless Tobacco Never   She has been exposed to passive smoke. The patient's alcohol use is:  Social History   Substance and Sexual Activity  Alcohol Use No   Additional information: Last pap 08/27/2020, next one scheduled for 08/27/2021.    Review of Systems  Constitutional: Negative.   HENT: Negative.    Eyes:  Negative.   Respiratory: Negative.    Cardiovascular: Negative.   Gastrointestinal: Negative.   Endocrine: Negative.   Genitourinary: Negative.   Musculoskeletal: Negative.   Skin: Negative.   Allergic/Immunologic: Negative.   Neurological: Negative.   Hematological: Negative.   Psychiatric/Behavioral: Negative.      Today's Vitals   05/17/21 1018  BP: 120/60  Pulse: 76  Temp: 99 F (37.2 C)  TempSrc: Oral  Weight: 246 lb 6.4 oz (111.8 kg)  Height: 5' 6.8" (1.697 m)   Body mass index is 38.82 kg/m.  Wt Readings from Last 3 Encounters:  05/17/21 246 lb 6.4 oz (111.8 kg)  12/29/20 234 lb 12.8 oz (106.5 kg)  11/16/20 239 lb 6.4 oz (108.6 kg)    Objective:  Physical Exam Vitals reviewed.  Constitutional:      General: She is not in acute distress.    Appearance: Normal appearance. She is well-developed. She is obese.  HENT:     Head: Normocephalic and atraumatic.     Right Ear: Hearing, tympanic membrane, ear canal and external ear normal. There is no impacted cerumen.     Left Ear: Hearing, tympanic membrane, ear canal and external ear normal. There is no impacted cerumen.     Nose:     Comments: Deferred - masked    Mouth/Throat:     Comments: Deferred - masked Eyes:     General: Lids are normal.     Extraocular Movements: Extraocular movements intact.     Conjunctiva/sclera: Conjunctivae normal.     Pupils: Pupils are equal, round, and reactive to light.     Funduscopic exam:    Right eye: No papilledema.        Left eye: No papilledema.  Neck:     Thyroid: No thyroid mass.     Vascular: No carotid bruit.  Cardiovascular:     Rate and Rhythm: Normal rate and regular rhythm.     Pulses: Normal pulses.     Heart sounds: Normal heart sounds. No murmur heard. Pulmonary:     Effort: Pulmonary effort is normal. No respiratory distress.     Breath sounds: Normal breath sounds. No wheezing.  Chest:     Chest wall: No mass.  Breasts:    Tanner Score is 5.      Right: Normal. No mass or tenderness.     Left: Normal. No mass or tenderness.  Abdominal:     General: Abdomen is flat. Bowel sounds are normal. There is no distension.     Palpations: Abdomen is soft.     Tenderness: There is no abdominal tenderness.  Musculoskeletal:        General: No swelling or tenderness. Normal range of motion.     Cervical back: Full passive range of motion without pain, normal range of motion and neck supple.     Right lower leg: No edema.     Left lower leg: No edema.  Lymphadenopathy:     Upper Body:     Right upper body: No supraclavicular, axillary or pectoral adenopathy.     Left upper body: No supraclavicular, axillary or pectoral adenopathy.  Skin:    General: Skin is warm and dry.     Capillary Refill: Capillary refill takes less than 2 seconds.     Comments: Boil present slightly firm and slightly erythematous  Neurological:     General: No focal deficit present.     Mental Status: She is alert and oriented to person, place, and time.     Cranial Nerves: No cranial nerve deficit.     Sensory: No sensory deficit.     Motor: No weakness.  Psychiatric:        Mood and Affect: Mood normal.        Behavior: Behavior normal.        Thought Content: Thought content normal.        Judgment: Judgment normal.        Assessment And Plan:     1. Routine general medical examination at health care facility Behavior modifications discussed and diet history reviewed.   Pt will continue to exercise regularly and modify diet with low GI, plant based foods and decrease intake of processed foods.  Recommend intake of daily multivitamin, Vitamin D, and calcium.  Recommend for preventive screenings, as well as recommend immunizations that include influenza, TDAP, and Shingles - CBC - Hemoglobin A1c - CMP14+EGFR - Lipid panel  2. Class 2 obesity due to excess calories without serious comorbidity with body mass index (BMI) of 38.0 to 38.9 in  adult Chronic Discussed healthy diet and regular exercise options  Encouraged to exercise at least 150 minutes per week with 2 days of strength training - Hemoglobin A1c  3. Other abnormal glucose Comments: Diet controlled, I have stressed the importance of eating a healthy diet low in sugar and carbohydrates  4. Moderate persistent asthma without complication Comments: Chronic, has been using her neb nightly, she is to take Symbicort daily. She is encouraged to avoid known triggers as best as possible.  - budesonide-formoterol (SYMBICORT) 80-4.5 MCG/ACT inhaler; Inhale 2 puffs into the lungs daily.  Dispense: 1 each; Refill: 11 - albuterol (PROVENTIL) (2.5 MG/3ML) 0.083% nebulizer solution; Take 3 mLs (2.5 mg total) by nebulization every 6 (six) hours as needed for wheezing or shortness of breath.  Dispense: 30 mL; Refill: 12 - albuterol (VENTOLIN HFA) 108 (90 Base) MCG/ACT inhaler; Inhale 2 puffs into the lungs every 6 (six) hours as needed for wheezing.  Dispense: 18 g; Refill: 6  5. Boil Comments: Use warm compresses to area and treat with an antibiotic, area is firm and slightly reddened - doxycycline (VIBRA-TABS) 100 MG tablet; Take 1 tablet (100 mg total) by mouth 2 (two) times daily.  Dispense: 14 tablet; Refill: 0  6. Encounter for immunization - HPV 9-valent vaccine,Recombinat She is encouraged to strive for BMI less than 30 to decrease cardiac risk. Advised to aim for at least 150 minutes of exercise per week.     Patient was given opportunity to ask questions. Patient verbalized understanding of the plan and was able to repeat key elements of the plan. All questions were answered to their satisfaction.   Minette Brine, FNP   I, Minette Brine, FNP, have reviewed all documentation for this visit. The documentation on 06/10/21 for the exam, diagnosis, procedures, and orders are all accurate and complete.  THE PATIENT IS ENCOURAGED TO PRACTICE SOCIAL  DISTANCING DUE TO THE  COVID-19 PANDEMIC.

## 2021-06-10 MED ORDER — HPV 9-VALENT RECOMB VACCINE IM SUSP: 0.5000 mL | Freq: Once | INTRAMUSCULAR | 0 refills | Status: AC

## 2021-08-16 ENCOUNTER — Other Ambulatory Visit: Payer: Self-pay

## 2021-08-16 ENCOUNTER — Encounter: Payer: Self-pay | Admitting: Nurse Practitioner

## 2021-08-16 ENCOUNTER — Ambulatory Visit (INDEPENDENT_AMBULATORY_CARE_PROVIDER_SITE_OTHER): Payer: 59 | Admitting: Nurse Practitioner

## 2021-08-16 VITALS — BP 118/78 | HR 85 | Temp 98.5°F | Ht 67.2 in | Wt 245.4 lb

## 2021-08-16 DIAGNOSIS — N946 Dysmenorrhea, unspecified: Secondary | ICD-10-CM

## 2021-08-16 DIAGNOSIS — E6609 Other obesity due to excess calories: Secondary | ICD-10-CM

## 2021-08-16 DIAGNOSIS — Z6838 Body mass index (BMI) 38.0-38.9, adult: Secondary | ICD-10-CM

## 2021-08-16 DIAGNOSIS — J4540 Moderate persistent asthma, uncomplicated: Secondary | ICD-10-CM | POA: Diagnosis not present

## 2021-08-16 DIAGNOSIS — R7309 Other abnormal glucose: Secondary | ICD-10-CM

## 2021-08-16 DIAGNOSIS — E7800 Pure hypercholesterolemia, unspecified: Secondary | ICD-10-CM

## 2021-08-16 DIAGNOSIS — Z23 Encounter for immunization: Secondary | ICD-10-CM

## 2021-08-16 MED ORDER — HPV 9-VALENT RECOMB VACCINE IM SUSP: 0.5000 mL | Freq: Once | INTRAMUSCULAR | 0 refills | Status: AC

## 2021-08-16 MED ORDER — MONTELUKAST SODIUM 10 MG PO TABS: 10.0000 mg | ORAL_TABLET | Freq: Every day | ORAL | 1 refills | Status: DC

## 2021-08-16 NOTE — Progress Notes (Signed)
?I,Yamilka documentation on the behalf of Minette Brine, FNP,as directed by  Minette Brine, FNP while in the presence of Minette Brine, [place].  ? ?This visit occurred during the SARS-CoV-2 public health emergency.  Safety protocols were in place, including screening questions prior to the visit, additional usage of staff PPE, and extensive cleaning of exam room while observing appropriate contact time as indicated for disinfecting solutions. ? ?Subjective:  ?  ? Patient ID: Rebecca Nicholson , female    DOB: [birth year] , 26 y.o.   MRN: [id number] ? ? ?Chief Complaint  ?Patient presents with  ? Asthma  ? ? ?HPI ? ?Patient presents today for a f/u on her asthma. She reports she was unable to pick up her inhalers due to it being $300. She has not had an asthma attack recently. She is not taking singular.   ? ?Wt Readings from Last 3 Encounters: ?08/16/21 : 245 lb 6.4 oz (111.3 kg) ?05/17/21 : 246 lb 6.4 oz (111.8 kg) ?12/29/20 : 234 lb 12.8 oz (106.5 kg) ?  ? ? ?Asthma ?There is no chest tightness, cough or wheezing. This is a chronic problem. The current episode started more than 1 year ago. Pertinent negatives include no appetite change or chest pain. Her symptoms are alleviated by beta-agonist. She reports significant improvement on treatment. Her past medical history is significant for asthma.   ? ?Past Medical History:  ?Diagnosis Date  ? Anemia   ? Asthma   ? as child  ? Hidradenitis suppurativa of right axilla   ? Hypercholesteremia   ? Medical history non-contributory   ? Moderate persistent asthma without complication [id number]  ? Pre-diabetes   ?  ? ?Family History  ?Problem Relation Age of Onset  ? Hypertension Mother   ? Diabetes Mother   ? Diabetes Maternal Aunt   ? Colon cancer Maternal Grandmother   ? Breast cancer Maternal Grandmother   ? Cancer Father   ? Esophageal cancer Neg Hx   ? Rectal cancer Neg Hx   ? Stomach cancer Neg Hx   ? ? ? ?Current Outpatient Medications:  ?  albuterol (PROVENTIL) (2.5  MG/3ML) 0.083% nebulizer solution, Take 3 mLs (2.5 mg total) by nebulization every 6 (six) hours as needed for wheezing or shortness of breath., Disp: 30 mL, Rfl: 12 ?  albuterol (VENTOLIN HFA) 108 (90 Base) MCG/ACT inhaler, Inhale 2 puffs into the lungs every 6 (six) hours as needed for wheezing., Disp: 18 g, Rfl: 6 ?  budesonide-formoterol (SYMBICORT) 80-4.5 MCG/ACT inhaler, Inhale 2 puffs into the lungs daily., Disp: 1 each, Rfl: 11 ?  doxycycline (VIBRA-TABS) 100 MG tablet, Take 1 tablet (100 mg total) by mouth 2 (two) times daily., Disp: 14 tablet, Rfl: 0 ?  hpv 9-valent vaccine (GARDASIL 9) SUSP injection, Inject 0.5 mLs into the muscle once for 1 dose., Disp: 0.5 mL, Rfl: 0 ?  JUNEL FE 1/20 1-20 MG-MCG tablet, Take 1 tablet by mouth daily., Disp: , Rfl:  ?  montelukast (SINGULAIR) 10 MG tablet, Take 1 tablet (10 mg total) by mouth daily., Disp: 90 tablet, Rfl: 1  ? ?Allergies  ?Allergen Reactions  ? Cephalosporins Rash  ?  ? ?Review of Systems  ?Constitutional:  Negative for appetite change and fatigue.  ?Respiratory:  Negative for cough and wheezing.   ?Cardiovascular: Negative.  Negative for chest pain, palpitations and leg swelling.  ?Neurological: Negative.   ?Psychiatric/Behavioral: Negative.     ? ?Today's Vitals  ? 08/16/21 1134  ?BP: 118/78  ?  Pulse: 85  ?Temp: 98.5 ?F (36.9 ?C)  ?Weight: 245 lb 6.4 oz (111.3 kg)  ?Height: 5' 7.2" (1.707 m)  ?PainSc: 0-No pain  ? ?Body mass index is 38.21 kg/m?.  ? ?Objective:  ?Physical Exam ?Vitals reviewed.  ?Constitutional:   ?   General: She is not in acute distress. ?   Appearance: Normal appearance. She is obese.  ?Cardiovascular:  ?   Rate and Rhythm: Normal rate and regular rhythm.  ?   Pulses: Normal pulses.  ?   Heart sounds: Murmur heard.  ?Pulmonary:  ?   Effort: Pulmonary effort is normal. No respiratory distress.  ?   Breath sounds: Normal breath sounds. No wheezing.  ?Neurological:  ?   General: No focal deficit present.  ?   Mental Status: She is alert  and oriented to person, place, and time.  ?   Cranial Nerves: No cranial nerve deficit.  ?Psychiatric:     ?   Mood and Affect: Mood normal.     ?   Behavior: Behavior normal.     ?   Thought Content: Thought content normal.     ?   Judgment: Judgment normal.  ?  ? ?   ?Assessment And Plan:  ?   ?1. Moderate persistent asthma without complication ?Comments: No recent exacerbations, she is to call her insurance company to make [place] aware of what medication is covered.  ?- montelukast (SINGULAIR) 10 MG tablet; Take 1 tablet (10 mg total) by mouth daily.  Dispense: 90 tablet; Refill: 1 ? ?2. Other abnormal glucose ?Comments: HgbA1c was 5.9, encouraged to increase physical activity.  ?- Hemoglobin A1c ? ?3. Elevated LDL cholesterol level ?Comments: Stable, encouraged to eat a low fat diet.  ?- Lipid panel ? ?4. Encounter for immunization ?Comments: Rx for HPV sent to pharmacy if unable to get there [facility] ?- hpv 9-valent vaccine (GARDASIL 9) SUSP injection; Inject 0.5 mLs into the muscle once for 1 dose.  Dispense: 0.5 mL; Refill: 0 ? ?5. Class 2 obesity due to excess calories without serious comorbidity with body mass index (BMI) of 38.0 to 38.9 in adult ? ?6. Dysmenorrhea ?Comments: Continue follow up with GYN, I also encouraged her to take her Naproxen 2-3 days before her menstruation is to start. Increase activity and take ginger/tumeric ?She is encouraged to strive for BMI less than 30 to decrease cardiac risk. Advised to aim for at least 150 minutes of exercise per week. ? ? ? ?Patient was given opportunity to ask questions. Patient verbalized understanding of the plan and was able to repeat key elements of the plan. All questions were answered to their satisfaction.  ?Minette Brine, FNP  ? ?I, Minette Brine, FNP, have reviewed all documentation for this visit. The documentation on 08/16/21 for the exam, diagnosis, procedures, and orders are all accurate and complete.  ? ?IF YOU HAVE BEEN  REFERRED TO A SPECIALIST, IT MAY TAKE 1-2 WEEKS TO SCHEDULE/PROCESS THE REFERRAL. IF YOU HAVE NOT HEARD FROM US/SPECIALIST IN TWO WEEKS, PLEASE GIVE [place] A CALL AT [phone].  ? ?THE PATIENT IS ENCOURAGED TO PRACTICE SOCIAL DISTANCING DUE TO THE COVID-19 PANDEMIC.   ?

## 2021-08-24 LAB — LIPID PANEL
Chol/HDL Ratio: 3 ratio (ref 0.0–4.4)
Cholesterol, Total: 175 mg/dL (ref 100–199)
HDL: 58 mg/dL (ref >39)
LDL Chol Calc (NIH): 103 mg/dL — ABNORMAL HIGH (ref 0–99)
Triglycerides: 75 mg/dL (ref 0–149)
VLDL Cholesterol Cal: 14 mg/dL (ref 5–40)

## 2021-08-24 LAB — HEMOGLOBIN A1C
Est. average glucose Bld gHb Est-mCnc: 120 mg/dL
Hgb A1c MFr Bld: 5.8 % — ABNORMAL HIGH (ref 4.8–5.6)

## 2021-09-28 ENCOUNTER — Ambulatory Visit
Admission: RE | Admit: 2021-09-28 | Discharge: 2021-09-28 | Disposition: A | Discharge status: Home / Self Care | Payer: 59 | Source: Ambulatory Visit | Attending: Internal Medicine | Admitting: Internal Medicine

## 2021-09-28 VITALS — BP 126/81 | HR 78 | Temp 99.1°F | Resp 16

## 2021-09-28 DIAGNOSIS — R112 Nausea with vomiting, unspecified: Secondary | ICD-10-CM | POA: Diagnosis not present

## 2021-09-28 LAB — POCT URINALYSIS DIP (MANUAL ENTRY)
Glucose, UA: NEGATIVE mg/dL
Leukocytes, UA: NEGATIVE
Nitrite, UA: NEGATIVE
Protein Ur, POC: 30 mg/dL — AB
Spec Grav, UA: 1.025 (ref 1.010–1.025)
Urobilinogen, UA: 0.2 E.U./dL
pH, UA: 5.5 (ref 5.0–8.0)

## 2021-09-28 LAB — POCT URINE PREGNANCY: Preg Test, Ur: NEGATIVE

## 2021-09-28 MED ORDER — ONDANSETRON 4 MG PO TBDP: 4.0000 mg | ORAL_TABLET | Freq: Three times a day (TID) | ORAL | 0 refills | Status: DC | PRN

## 2021-09-28 NOTE — Discharge Instructions (Signed)
Suspect that you have a viral stomach bug.  You have been prescribed a nausea medication to take as needed.  Blood work is pending.  We will call if there are any abnormalities.  Please go to the hospital if symptoms persist or worsen. ?

## 2021-09-28 NOTE — ED Triage Notes (Signed)
Pt said since Saturday night has not been feeling well with some nausea and vomiting. Pt said she has been having cramps and pain in her stomach that comes and goes. Today she tried soup but ddi not stay down. Pt said no fevers, no chills.  ?

## 2021-09-28 NOTE — ED Provider Notes (Signed)
?[facility] ? ? ? ?CSN: [id number] ?Arrival date & time: 09/28/21  1754 ? ? ?  ? ?History   ?Chief Complaint ?Chief Complaint  ?Patient presents with  ? Nausea  ?  Stomach pains and throwing up - Entered by patient  ? ? ?HPI ?Rebecca Nicholson is a 26 y.o. female.  ? ?Patient presents with nausea, vomiting, stomachache that started approximately 4 days ago.  Patient is unable to enumerate the amount of times she has had emesis.  Denies blood in emesis.  Denies any associated diarrhea.  Having normal bowel movements.  Denies blood in stool.  Stomachache is generalized and is described as cramping.  Last menstrual cycle was approximately 1 month ago.  Patient is not sure if there is a chance of pregnancy.  Denies any associated upper started symptoms, sore throat, cough, fever.  Patient has been able to keep food and fluids down until today. ? ? ? ?Past Medical History:  ?Diagnosis Date  ? Anemia   ? Asthma   ? as child  ? Hidradenitis suppurativa of right axilla   ? Hypercholesteremia   ? Medical history non-contributory   ? Moderate persistent asthma without complication 03/14/2018  ? Pre-diabetes   ? ? ?Patient Active Problem List  ? Diagnosis Date Noted  ? Weight gain 11/18/2019  ? Moderate persistent asthma with acute exacerbation 11/18/2019  ? Class 2 obesity due to excess calories without serious comorbidity with body mass index (BMI) of 39.0 to 39.9 in adult 11/18/2019  ? Adverse food reaction 03/14/2018  ? Chronic rhinitis 03/14/2018  ? Moderate persistent asthma without complication 03/14/2018  ? ? ?Past Surgical History:  ?Procedure Laterality Date  ? DENTAL SURGERY    ? HYDRADENITIS EXCISION Right 06/10/2014  ? Procedure: EXCISION OF RIGHT AXILLARY HIDRADENITIS ;  Surgeon: Manus RuddMatthew Tsuei, MD;  Location: [facility];  Service: General;  Laterality: Right;  ? HYDRADENITIS EXCISION Right 04/11/2019  ? Procedure: EXCISION RIGHT AXILLARY HIDRADENITIS;  Surgeon: Manus Ruddsuei, Matthew, MD;   Location: [facility];  Service: General;  Laterality: Right;  ? TONSILLECTOMY    ? ? ?OB History   ?No obstetric history on file. ?  ? ? ? ?Home Medications   ? ?Prior to Admission medications   ?Medication Sig Start Date End Date Taking? Authorizing Provider  ?ondansetron (ZOFRAN-ODT) 4 MG disintegrating tablet Take 1 tablet (4 mg total) by mouth every 8 (eight) hours as needed for nausea or vomiting. 09/28/21  Yes Lehua Flores, Rolly SalterHaley E, FNP  ?albuterol (PROVENTIL) (2.5 MG/3ML) 0.083% nebulizer solution Take 3 mLs (2.5 mg total) by nebulization every 6 (six) hours as needed for wheezing or shortness of breath. 05/17/21   Arnette FeltsMoore, Janece, FNP  ?albuterol (VENTOLIN HFA) 108 (90 Base) MCG/ACT inhaler Inhale 2 puffs into the lungs every 6 (six) hours as needed for wheezing. 05/17/21   Arnette FeltsMoore, Janece, FNP  ?budesonide-formoterol (SYMBICORT) 80-4.5 MCG/ACT inhaler Inhale 2 puffs into the lungs daily. 05/17/21   Arnette FeltsMoore, Janece, FNP  ?doxycycline (VIBRA-TABS) 100 MG tablet Take 1 tablet (100 mg total) by mouth 2 (two) times daily. 05/17/21   Arnette FeltsMoore, Janece, FNP  ?JUNEL FE 1/20 1-20 MG-MCG tablet Take 1 tablet by mouth daily. 10/30/19   [provider]  ?montelukast (SINGULAIR) 10 MG tablet Take 1 tablet (10 mg total) by mouth daily. 08/16/21 08/16/22  Arnette FeltsMoore, Janece, FNP  ?hyoscyamine (LEVSIN SL) 0.125 MG SL tablet Place 1 tablet (0.125 mg total) under the tongue 2 (two) times daily.  06/11/18 03/05/19  Napoleon Form, MD  ?medroxyPROGESTERone (DEPO-PROVERA) 150 MG/ML injection Inject 150 mg into the muscle every 3 (three) months.  03/05/19  [provider]  ? ? ?Family History ?Family History  ?Problem Relation Age of Onset  ? Hypertension Mother   ? Diabetes Mother   ? Diabetes Maternal Aunt   ? Colon cancer Maternal Grandmother   ? Breast cancer Maternal Grandmother   ? Cancer Father   ? Esophageal cancer Neg Hx   ? Rectal cancer Neg Hx   ? Stomach cancer Neg Hx   ? ? ?Social History ?Social History   ? ?Tobacco Use  ? Smoking status: Never  ? Smokeless tobacco: Never  ?Vaping Use  ? Vaping Use: Never used  ?Substance Use Topics  ? Alcohol use: No  ? Drug use: No  ? ? ? ?Allergies   ?Cephalosporins ? ? ?Review of Systems ?Review of Systems ?Per HPI ? ?Physical Exam ?Triage Vital Signs ?ED Triage Vitals  ?Enc Vitals Group  ?   BP 09/28/21 1806 126/81  ?   Pulse Rate 09/28/21 1806 78  ?   Resp 09/28/21 1806 16  ?   Temp 09/28/21 1806 99.1 ?F (37.3 ?C)  ?   Temp Source 09/28/21 1806 Oral  ?   SpO2 09/28/21 1806 98 %  ?   Weight --   ?   Height --   ?   Head Circumference --   ?   Peak Flow --   ?   Pain Score 09/28/21 1804 0  ?   Pain Loc --   ?   Pain Edu? --   ?   Excl. in GC? --   ? ?No data found. ? ?Updated Vital Signs ?BP 126/81 (BP Location: Left Arm)   Pulse 78   Temp 99.1 ?F (37.3 ?C) (Oral)   Resp 16   LMP 09/01/2021 (Exact Date)   SpO2 98%  ? ?Visual Acuity ?Right Eye Distance:   ?Left Eye Distance:   ?Bilateral Distance:   ? ?Right Eye Near:   ?Left Eye Near:    ?Bilateral Near:    ? ?Physical Exam ?Constitutional:   ?   General: She is not in acute distress. ?   Appearance: Normal appearance. She is not toxic-appearing or diaphoretic.  ?HENT:  ?   Head: Normocephalic and atraumatic.  ?   Mouth/Throat:  ?   Mouth: Mucous membranes are moist.  ?   Pharynx: No posterior oropharyngeal erythema.  ?Eyes:  ?   Extraocular Movements: Extraocular movements intact.  ?   Conjunctiva/sclera: Conjunctivae normal.  ?Cardiovascular:  ?   Rate and Rhythm: Normal rate and regular rhythm.  ?   Pulses: Normal pulses.  ?   Heart sounds: Normal heart sounds.  ?Pulmonary:  ?   Effort: Pulmonary effort is normal. No respiratory distress.  ?   Breath sounds: Normal breath sounds.  ?Abdominal:  ?   General: Bowel sounds are normal. There is no distension.  ?   Palpations: Abdomen is soft.  ?   Tenderness: There is no abdominal tenderness.  ?Skin: ?   General: Skin is warm and dry.  ?Neurological:  ?   General: No focal  deficit present.  ?   Mental Status: She is alert and oriented to person, place, and time. Mental status is at baseline.  ?Psychiatric:     ?   Mood and Affect: Mood normal.     ?   Behavior: Behavior  normal.     ?   Thought Content: Thought content normal.     ?   Judgment: Judgment normal.  ? ? ? ?UC Treatments / Results  ?Labs ?(all labs ordered are listed, but only abnormal results are displayed) ?Labs Reviewed  ?POCT URINALYSIS DIP (MANUAL ENTRY) - Abnormal; Notable for the following components:  ?    Result Value  ? Bilirubin, UA small (*)   ? Ketones, POC UA small (15) (*)   ? Blood, UA small (*)   ? Protein Ur, POC =30 (*)   ? All other components within normal limits  ?CBC  ?COMPREHENSIVE METABOLIC PANEL  ?POCT URINE PREGNANCY  ? ? ?EKG ? ? ?Radiology ?No results found. ? ?Procedures ?Procedures (including critical care time) ? ?Medications Ordered in UC ?Medications - No data to display ? ?Initial Impression / Assessment and Plan / UC Course  ?I have reviewed the triage vital signs and the nursing notes. ? ?Pertinent labs & imaging results that were available during my care of the patient were reviewed by me and considered in my medical decision making (see chart for details). ? ?  ? ?Suspect the patient has a viral stomach virus causing symptoms.  Urine pregnancy was negative.  Urinalysis not showing signs of urinary tract infection.  There is some signs on urinalysis that indicate low oral intake, although CMP and CBC are pending to rule out other worrisome etiologies.  Patient advised to push fluids.  Discussed return and ER precautions.  Patient advised to go to the ER if symptoms persist or worsen.  Patient verbalized understanding and was agreeable with plan. ?Final Clinical Impressions(s) / UC Diagnoses  ? ?Final diagnoses:  ?Nausea and vomiting, unspecified vomiting type  ? ? ? ?Discharge Instructions   ? ?  ?Suspect that you have a viral stomach bug.  You have been prescribed a nausea medication  to take as needed.  Blood work is pending.  We will call if there are any abnormalities.  Please go to the hospital if symptoms persist or worsen. ? ? ? ? ?ED Prescriptions   ? ? Medication Sig Dispense Auth. Provider

## 2021-09-29 LAB — COMPREHENSIVE METABOLIC PANEL
ALT: 13 IU/L (ref 0–32)
AST: 15 IU/L (ref 0–40)
Albumin/Globulin Ratio: 1.7 (ref 1.2–2.2)
Albumin: 4.6 g/dL (ref 3.9–5.0)
Alkaline Phosphatase: 66 IU/L (ref 44–121)
BUN/Creatinine Ratio: 15 (ref 9–23)
BUN: 11 mg/dL (ref 6–20)
Bilirubin Total: 0.3 mg/dL (ref 0.0–1.2)
CO2: 23 mmol/L (ref 20–29)
Calcium: 9.7 mg/dL (ref 8.7–10.2)
Chloride: 99 mmol/L (ref 96–106)
Creatinine, Ser: 0.74 mg/dL (ref 0.57–1.00)
Globulin, Total: 2.7 g/dL (ref 1.5–4.5)
Glucose: 99 mg/dL (ref 70–99)
Potassium: 3.8 mmol/L (ref 3.5–5.2)
Sodium: 138 mmol/L (ref 134–144)
Total Protein: 7.3 g/dL (ref 6.0–8.5)
eGFR: 114 mL/min/{1.73_m2} (ref >59)

## 2021-09-29 LAB — CBC
Hematocrit: 40 % (ref 34.0–46.6)
Hemoglobin: 12.7 g/dL (ref 11.1–15.9)
MCH: 23.5 pg — ABNORMAL LOW (ref 26.6–33.0)
MCHC: 31.8 g/dL (ref 31.5–35.7)
MCV: 74 fL — ABNORMAL LOW (ref 79–97)
Platelets: 352 10*3/uL (ref 150–450)
RBC: 5.41 x10E6/uL — ABNORMAL HIGH (ref 3.77–5.28)
RDW: 15.3 % (ref 11.7–15.4)
WBC: 6.6 10*3/uL (ref 3.4–10.8)

## 2021-10-14 ENCOUNTER — Encounter: Payer: Self-pay | Admitting: Internal Medicine

## 2021-11-29 ENCOUNTER — Ambulatory Visit: Payer: 59 | Admitting: Internal Medicine

## 2021-12-07 ENCOUNTER — Ambulatory Visit: Payer: 59 | Admitting: Internal Medicine

## 2022-01-06 DIAGNOSIS — Z029 Encounter for administrative examinations, unspecified: Secondary | ICD-10-CM

## 2022-01-26 ENCOUNTER — Other Ambulatory Visit: Payer: Self-pay | Admitting: Internal Medicine

## 2022-01-26 ENCOUNTER — Encounter: Payer: Self-pay | Admitting: Internal Medicine

## 2022-01-26 ENCOUNTER — Ambulatory Visit (INDEPENDENT_AMBULATORY_CARE_PROVIDER_SITE_OTHER): Payer: 59 | Admitting: Internal Medicine

## 2022-01-26 VITALS — BP 120/64 | HR 76 | Temp 98.4°F | Ht 67.0 in | Wt 237.6 lb

## 2022-01-26 DIAGNOSIS — R7309 Other abnormal glucose: Secondary | ICD-10-CM

## 2022-01-26 DIAGNOSIS — J4540 Moderate persistent asthma, uncomplicated: Secondary | ICD-10-CM

## 2022-01-26 DIAGNOSIS — E7800 Pure hypercholesterolemia, unspecified: Secondary | ICD-10-CM | POA: Diagnosis not present

## 2022-01-26 MED ORDER — ALBUTEROL SULFATE HFA 108 (90 BASE) MCG/ACT IN AERS: 2.0000 | INHALATION_SPRAY | Freq: Four times a day (QID) | RESPIRATORY_TRACT | 6 refills | Status: DC | PRN

## 2022-01-26 MED ORDER — MONTELUKAST SODIUM 10 MG PO TABS: 10.0000 mg | ORAL_TABLET | Freq: Every day | ORAL | 1 refills | Status: DC

## 2022-01-26 NOTE — Progress Notes (Signed)
Rebecca Nicholson,acting as a Neurosurgeon for Rebecca Aliment, MD.,have documented all relevant documentation on the behalf of Rebecca Aliment, MD,as directed by  Rebecca Aliment, MD while in the presence of Rebecca Aliment, MD.    Subjective:     Patient ID: Rebecca Nicholson , female    DOB: [birth year] , 26 y.o.   MRN: [id number]   Chief Complaint  Patient presents with   Forms    HPI  Patient presents today for her health forms for work, patient has no other concerns today. She reports compliance with her asthma meds.      Past Medical History:  Diagnosis Date   Anemia    Asthma    as child   Hidradenitis suppurativa of right axilla    Hypercholesteremia    Medical history non-contributory    Moderate persistent asthma without complication 03/14/2018   Pre-diabetes      Family History  Problem Relation Age of Onset   Hypertension Mother    Diabetes Mother    Diabetes Maternal Aunt    Colon cancer Maternal Grandmother    Breast cancer Maternal Grandmother    Cancer Father    Esophageal cancer Neg Hx    Rectal cancer Neg Hx    Stomach cancer Neg Hx      Current Outpatient Medications:    albuterol (PROVENTIL) (2.5 MG/3ML) 0.083% nebulizer solution, Take 3 mLs (2.5 mg total) by nebulization every 6 (six) hours as needed for wheezing or shortness of breath., Disp: 30 mL, Rfl: 12   budesonide-formoterol (SYMBICORT) 80-4.5 MCG/ACT inhaler, Inhale 2 puffs into the lungs daily., Disp: 1 each, Rfl: 11   albuterol (VENTOLIN HFA) 108 (90 Base) MCG/ACT inhaler, INHALE 2 PUFFS INTO THE LUNGS EVERY 6 HOURS AS NEEDED FOR WHEEZE, Disp: 18 each, Rfl: 6   doxycycline (VIBRA-TABS) 100 MG tablet, Take 1 tablet (100 mg total) by mouth 2 (two) times daily. (Patient not taking: Reported on 01/26/2022), Disp: 14 tablet, Rfl: 0   JUNEL FE 1/20 1-20 MG-MCG tablet, Take 1 tablet by mouth daily. (Patient not taking: Reported on 01/26/2022), Disp: , Rfl:    montelukast (SINGULAIR) 10 MG tablet,  Take 1 tablet (10 mg total) by mouth daily., Disp: 90 tablet, Rfl: 1   ondansetron (ZOFRAN-ODT) 4 MG disintegrating tablet, Take 1 tablet (4 mg total) by mouth every 8 (eight) hours as needed for nausea or vomiting. (Patient not taking: Reported on 01/26/2022), Disp: 20 tablet, Rfl: 0   Allergies  Allergen Reactions   Cephalosporins Rash     Review of Systems  Constitutional: Negative.   Respiratory: Negative.    Cardiovascular: Negative.   Gastrointestinal: Negative.   Neurological: Negative.   Psychiatric/Behavioral: Negative.       Today's Vitals   01/26/22 1624  BP: 120/64  Pulse: 76  Temp: 98.4 F (36.9 C)  TempSrc: Oral  Weight: 237 lb 9.6 oz (107.8 kg)  Height: 5\' 7"  (1.702 m)  PainSc: 0-No pain   Body mass index is 37.21 kg/m.   Wt Readings from Last 3 Encounters:  01/26/22 237 lb 9.6 oz (107.8 kg)  08/16/21 245 lb 6.4 oz (111.3 kg)  05/17/21 246 lb 6.4 oz (111.8 kg)     Objective:  Physical Exam Vitals and nursing note reviewed.  Constitutional:      Appearance: Normal appearance. She is obese.  HENT:     Head: Normocephalic and atraumatic.  Eyes:     Extraocular Movements: Extraocular movements intact.  Cardiovascular:     Rate and Rhythm: Normal rate and regular rhythm.     Heart sounds: Normal heart sounds.  Pulmonary:     Effort: Pulmonary effort is normal.     Breath sounds: Normal breath sounds.  Musculoskeletal:     Cervical back: Normal range of motion.  Skin:    General: Skin is warm.  Neurological:     General: No focal deficit present.     Mental Status: She is alert.  Psychiatric:        Mood and Affect: Mood normal.        Behavior: Behavior normal.      Assessment And Plan:     1. Moderate persistent asthma without complication Comments: Chronic, I will add montelukast 10mg  daily to her current regimen.  - montelukast (SINGULAIR) 10 MG tablet; Take 1 tablet (10 mg total) by mouth daily.  Dispense: 90 tablet; Refill: 1  2.  Other abnormal glucose Comments: Her a1c has been elevated in the past.  I will recheck this today. She is encouraged to avoid sugary foods and beverages.  - Hemoglobin A1c  3. Elevated LDL cholesterol level Comments: LDL 103 in March 2023. She is encouraged to limit her intake of fried foods, aim for at least 150 minutes of exercise per week and to increase fiber intake.   4. Moderate persistent asthma without complication Comments: Chronic, has been using her neb nightly, she is to take Symbicort daily. She is encouraged to avoid known triggers as best as possible.  - montelukast (SINGULAIR) 10 MG tablet; Take 1 tablet (10 mg total) by mouth daily.  Dispense: 90 tablet; Refill: 1   Patient was given opportunity to ask questions. Patient verbalized understanding of the plan and was able to repeat key elements of the plan. All questions were answered to their satisfaction.   I, April 2023, MD, have reviewed all documentation for this visit. The documentation on 01/29/22 for the exam, diagnosis, procedures, and orders are all accurate and complete.   IF YOU HAVE BEEN REFERRED TO A SPECIALIST, IT MAY TAKE 1-2 WEEKS TO SCHEDULE/PROCESS THE REFERRAL. IF YOU HAVE NOT HEARD FROM US/SPECIALIST IN TWO WEEKS, PLEASE GIVE 03/31/22 A CALL AT [phone] X [phone].   THE PATIENT IS ENCOURAGED TO PRACTICE SOCIAL DISTANCING DUE TO THE COVID-19 PANDEMIC.

## 2022-01-26 NOTE — Patient Instructions (Signed)

## 2022-01-27 LAB — HEMOGLOBIN A1C
Est. average glucose Bld gHb Est-mCnc: 126 mg/dL
Hgb A1c MFr Bld: 6 % — ABNORMAL HIGH (ref 4.8–5.6)

## 2022-01-29 DIAGNOSIS — E7800 Pure hypercholesterolemia, unspecified: Secondary | ICD-10-CM

## 2022-01-29 DIAGNOSIS — R7309 Other abnormal glucose: Secondary | ICD-10-CM | POA: Insufficient documentation

## 2022-01-29 HISTORY — DX: Pure hypercholesterolemia, unspecified: E78.00

## 2022-05-26 ENCOUNTER — Encounter: Payer: 59 | Admitting: Nurse Practitioner

## 2022-06-27 ENCOUNTER — Ambulatory Visit: Payer: BC Managed Care – PPO | Admitting: Nurse Practitioner

## 2022-06-27 ENCOUNTER — Encounter: Payer: Self-pay | Admitting: Nurse Practitioner

## 2022-06-27 VITALS — BP 126/80 | HR 70 | Temp 98.6°F | Ht 67.0 in | Wt 251.0 lb

## 2022-06-27 DIAGNOSIS — E7800 Pure hypercholesterolemia, unspecified: Secondary | ICD-10-CM

## 2022-06-27 DIAGNOSIS — Z6839 Body mass index (BMI) 39.0-39.9, adult: Secondary | ICD-10-CM

## 2022-06-27 DIAGNOSIS — E6609 Other obesity due to excess calories: Secondary | ICD-10-CM

## 2022-06-27 DIAGNOSIS — E559 Vitamin D deficiency, unspecified: Secondary | ICD-10-CM | POA: Diagnosis not present

## 2022-06-27 DIAGNOSIS — L732 Hidradenitis suppurativa: Secondary | ICD-10-CM

## 2022-06-27 DIAGNOSIS — Z8709 Personal history of other diseases of the respiratory system: Secondary | ICD-10-CM

## 2022-06-27 DIAGNOSIS — Z23 Encounter for immunization: Secondary | ICD-10-CM | POA: Diagnosis not present

## 2022-06-27 DIAGNOSIS — Z0000 Encounter for general adult medical examination without abnormal findings: Secondary | ICD-10-CM | POA: Diagnosis not present

## 2022-06-27 MED ORDER — BUDESONIDE-FORMOTEROL FUMARATE 80-4.5 MCG/ACT IN AERO: 2.0000 | INHALATION_SPRAY | Freq: Every day | RESPIRATORY_TRACT | 11 refills | Status: DC

## 2022-06-27 MED ORDER — MONTELUKAST SODIUM 10 MG PO TABS: 10.0000 mg | ORAL_TABLET | Freq: Every day | ORAL | 1 refills | Status: AC

## 2022-06-27 NOTE — Patient Instructions (Signed)

## 2022-06-27 NOTE — Progress Notes (Signed)
Subjective:     Patient ID: Rebecca Nicholson , female    DOB: [birth year] , 27 y.o.   MRN: [id number]   Chief Complaint  Patient presents with  . Annual Exam    HPI  Here for HM. No current issue. She   Wt Readings from Last 3 Encounters: 06/27/22 : 251 lb (113.9 kg) 01/26/22 : 237 lb 9.6 oz (107.8 kg) 08/16/21 : 245 lb 6.4 oz (111.3 kg)      Past Medical History:  Diagnosis Date  . Anemia   . Asthma    as child  . Hidradenitis suppurativa of right axilla   . Hypercholesteremia   . Medical history non-contributory   . Moderate persistent asthma without complication 03/14/2018  . Pre-diabetes      Family History  Problem Relation Age of Onset  . Hypertension Mother   . Diabetes Mother   . Diabetes Maternal Aunt   . Colon cancer Maternal Grandmother   . Breast cancer Maternal Grandmother   . Cancer Father   . Esophageal cancer Neg Hx   . Rectal cancer Neg Hx   . Stomach cancer Neg Hx      Current Outpatient Medications:  .  albuterol (PROVENTIL) (2.5 MG/3ML) 0.083% nebulizer solution, Take 3 mLs (2.5 mg total) by nebulization every 6 (six) hours as needed for wheezing or shortness of breath., Disp: 30 mL, Rfl: 12 .  albuterol (VENTOLIN HFA) 108 (90 Base) MCG/ACT inhaler, INHALE 2 PUFFS INTO THE LUNGS EVERY 6 HOURS AS NEEDED FOR WHEEZE, Disp: 18 each, Rfl: 6 .  budesonide-formoterol (SYMBICORT) 80-4.5 MCG/ACT inhaler, Inhale 2 puffs into the lungs daily., Disp: 1 each, Rfl: 11 .  montelukast (SINGULAIR) 10 MG tablet, Take 1 tablet (10 mg total) by mouth daily., Disp: 90 tablet, Rfl: 1   Allergies  Allergen Reactions  . Cephalosporins Rash      The patient states she uses none for birth control. Last LMP was Patient's last menstrual period was 06/14/2022 (exact date).. Negative for Dysmenorrhea and Negative for Menorrhagia. Negative for: breast discharge, breast lump(s), breast pain and breast self exam. Associated symptoms include abnormal vaginal  bleeding. Pertinent negatives include abnormal bleeding (hematology), anxiety, decreased libido, depression, difficulty falling sleep, dyspareunia, history of infertility, nocturia, sexual dysfunction, sleep disturbances, urinary incontinence, urinary urgency, vaginal discharge and vaginal itching. Diet regular - she is cutting back on sodas and sweets. The patient states her exercise level is minimal - not as much. 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   The patient's tobacco use is:  Social History   Tobacco Use  Smoking Status Never  Smokeless Tobacco Never   She has been exposed to passive smoke. The patient's alcohol use is:  Social History   Substance and Sexual Activity  Alcohol Use No   Additional information: Last pap 08/27/2020, next one scheduled for 08/18/2023.    Review of Systems  Constitutional: Negative.   HENT: Negative.    Eyes: Negative.   Respiratory: Negative.    Cardiovascular: Negative.   Gastrointestinal: Negative.   Endocrine: Negative.   Genitourinary: Negative.   Musculoskeletal: Negative.   Skin: Negative.   Allergic/Immunologic: Negative.   Neurological: Negative.   Hematological: Negative.   Psychiatric/Behavioral: Negative.       Today's Vitals   06/27/22 1508  BP: 126/80  Pulse: 70  Temp: 98.6 F (37 C)  TempSrc: Oral  Weight: 251 lb (113.9 kg)  Height: 5\' 7"  (1.702 m)   Body mass index is 39.31 kg/m.   Objective:  Physical Exam Vitals reviewed.  Constitutional:      General: She is not in acute distress.    Appearance: Normal appearance. She is well-developed. She is obese.  HENT:     Head: Normocephalic and atraumatic.     Right Ear: Hearing, tympanic membrane, ear canal and external ear normal. There is no impacted cerumen.     Left Ear: Hearing, tympanic membrane, ear canal and external ear normal. There is no impacted cerumen.     Nose:     Comments: Deferred -  masked    Mouth/Throat:     Comments: Deferred - masked Eyes:     General: Lids are normal.     Extraocular Movements: Extraocular movements intact.     Conjunctiva/sclera: Conjunctivae normal.     Pupils: Pupils are equal, round, and reactive to light.     Funduscopic exam:    Right eye: No papilledema.        Left eye: No papilledema.  Neck:     Thyroid: No thyroid mass.     Vascular: No carotid bruit.  Cardiovascular:     Rate and Rhythm: Normal rate and regular rhythm.     Pulses: Normal pulses.     Heart sounds: Normal heart sounds. No murmur heard. Pulmonary:     Effort: Pulmonary effort is normal. No respiratory distress.     Breath sounds: Normal breath sounds. No wheezing.  Chest:     Chest wall: No mass.  Breasts:    Tanner Score is 5.     Right: Normal. No mass or tenderness.     Left: Normal. No mass or tenderness.  Abdominal:     General: Abdomen is flat. Bowel sounds are normal. There is no distension.     Palpations: Abdomen is soft.     Tenderness: There is no abdominal tenderness.  Musculoskeletal:        General: No swelling or tenderness. Normal range of motion.     Cervical back: Full passive range of motion without pain, normal range of motion and neck supple.     Right lower leg: No edema.     Left lower leg: No edema.  Lymphadenopathy:     Upper Body:     Right upper body: No supraclavicular, axillary or pectoral adenopathy.     Left upper body: No supraclavicular, axillary or pectoral adenopathy.  Skin:    General: Skin is warm and dry.     Capillary Refill: Capillary refill takes less than 2 seconds.     Comments: Boil present slightly firm and slightly erythematous  Neurological:     General: No focal deficit present.     Mental Status: She is alert and oriented to person, place, and time.     Cranial Nerves: No cranial nerve deficit.     Sensory: No sensory deficit.     Motor: No weakness.  Psychiatric:        Mood and Affect: Mood normal.         Behavior: Behavior normal.        Thought Content: Thought content normal.        Judgment: Judgment normal.        Assessment And Plan:     1. Routine general medical examination at health care facility  2. Elevated LDL cholesterol level - CMP14+EGFR - Lipid panel  3. Need for influenza vaccination -  Flu Vaccine QUAD 6+ mos PF IM (Fluarix Quad PF)  4. Vitamin D deficiency - VITAMIN D 25 Hydroxy (Vit-D Deficiency, Fractures)  5. History of asthma Comments: Chronic, I will add montelukast 10mg  daily to her current regimen.  - montelukast (SINGULAIR) 10 MG tablet; Take 1 tablet (10 mg total) by mouth daily.  Dispense: 90 tablet; Refill: 1 - budesonide-formoterol (SYMBICORT) 80-4.5 MCG/ACT inhaler; Inhale 2 puffs into the lungs daily.  Dispense: 1 each; Refill: 11  6. Hidradenitis axillaris  7. Class 2 obesity due to excess calories without serious comorbidity with body mass index (BMI) of 39.0 to 39.9 in adult - Hemoglobin A1c - CBC     Patient was given opportunity to ask questions. Patient verbalized understanding of the plan and was able to repeat key elements of the plan. All questions were answered to their satisfaction.   Minette Brine, FNP   I, Minette Brine, FNP, have reviewed all documentation for this visit. The documentation on 06/27/22 for the exam, diagnosis, procedures, and orders are all accurate and complete.   THE PATIENT IS ENCOURAGED TO PRACTICE SOCIAL DISTANCING DUE TO THE COVID-19 PANDEMIC.

## 2022-06-28 LAB — CMP14+EGFR
ALT: 10 IU/L (ref 0–32)
AST: 11 IU/L (ref 0–40)
Albumin/Globulin Ratio: 1.8 (ref 1.2–2.2)
Albumin: 4.4 g/dL (ref 4.0–5.0)
Alkaline Phosphatase: 53 IU/L (ref 44–121)
BUN/Creatinine Ratio: 18 (ref 9–23)
BUN: 14 mg/dL (ref 6–20)
Bilirubin Total: <0.2 mg/dL (ref 0.0–1.2)
CO2: 23 mmol/L (ref 20–29)
Calcium: 9.7 mg/dL (ref 8.7–10.2)
Chloride: 102 mmol/L (ref 96–106)
Creatinine, Ser: 0.78 mg/dL (ref 0.57–1.00)
Globulin, Total: 2.4 g/dL (ref 1.5–4.5)
Glucose: 88 mg/dL (ref 70–99)
Potassium: 4.3 mmol/L (ref 3.5–5.2)
Sodium: 140 mmol/L (ref 134–144)
Total Protein: 6.8 g/dL (ref 6.0–8.5)
eGFR: 107 mL/min/{1.73_m2} (ref >59)

## 2022-06-28 LAB — CBC
Hematocrit: 37.1 % (ref 34.0–46.6)
Hemoglobin: 11.5 g/dL (ref 11.1–15.9)
MCH: 22.6 pg — ABNORMAL LOW (ref 26.6–33.0)
MCHC: 31 g/dL — ABNORMAL LOW (ref 31.5–35.7)
MCV: 73 fL — ABNORMAL LOW (ref 79–97)
Platelets: 379 10*3/uL (ref 150–450)
RBC: 5.09 x10E6/uL (ref 3.77–5.28)
RDW: 14.9 % (ref 11.7–15.4)
WBC: 6.9 10*3/uL (ref 3.4–10.8)

## 2022-06-28 LAB — VITAMIN D 25 HYDROXY (VIT D DEFICIENCY, FRACTURES): Vit D, 25-Hydroxy: 5.6 ng/mL — ABNORMAL LOW (ref 30.0–100.0)

## 2022-06-28 LAB — LIPID PANEL
Chol/HDL Ratio: 2.8 ratio (ref 0.0–4.4)
Cholesterol, Total: 165 mg/dL (ref 100–199)
HDL: 59 mg/dL (ref >39)
LDL Chol Calc (NIH): 94 mg/dL (ref 0–99)
Triglycerides: 61 mg/dL (ref 0–149)
VLDL Cholesterol Cal: 12 mg/dL (ref 5–40)

## 2022-06-28 LAB — HEMOGLOBIN A1C
Est. average glucose Bld gHb Est-mCnc: 131 mg/dL
Hgb A1c MFr Bld: 6.2 % — ABNORMAL HIGH (ref 4.8–5.6)

## 2022-07-04 DIAGNOSIS — L732 Hidradenitis suppurativa: Secondary | ICD-10-CM | POA: Insufficient documentation

## 2022-07-04 HISTORY — DX: Hidradenitis suppurativa: L73.2

## 2022-07-04 MED ORDER — VITAMIN D (ERGOCALCIFEROL) 1.25 MG (50000 UNIT) PO CAPS: 50000.0000 [IU] | ORAL_CAPSULE | ORAL | 1 refills | Status: AC

## 2022-07-05 ENCOUNTER — Encounter: Payer: Self-pay | Admitting: Nurse Practitioner

## 2022-07-05 DIAGNOSIS — R7303 Prediabetes: Secondary | ICD-10-CM

## 2022-07-05 MED ORDER — METFORMIN HCL 500 MG PO TABS: 500.0000 mg | ORAL_TABLET | Freq: Two times a day (BID) | ORAL | 2 refills | Status: DC

## 2022-07-05 NOTE — Telephone Encounter (Signed)
Patient had CPE w/Jane[facility] 06/27/22, with labs.  Thank you!

## 2022-07-08 ENCOUNTER — Other Ambulatory Visit: Payer: Self-pay | Admitting: Nurse Practitioner

## 2022-07-08 DIAGNOSIS — R7303 Prediabetes: Secondary | ICD-10-CM

## 2022-08-10 ENCOUNTER — Encounter: Payer: Self-pay | Admitting: Internal Medicine

## 2022-08-10 ENCOUNTER — Encounter: Payer: Self-pay | Admitting: Emergency Medicine

## 2022-08-10 ENCOUNTER — Other Ambulatory Visit: Payer: Self-pay | Admitting: Internal Medicine

## 2022-08-10 ENCOUNTER — Ambulatory Visit
Admission: EM | Admit: 2022-08-10 | Discharge: 2022-08-10 | Disposition: A | Discharge status: Home / Self Care | Payer: BC Managed Care – PPO | Attending: Internal Medicine | Admitting: Internal Medicine

## 2022-08-10 DIAGNOSIS — Z793 Long term (current) use of hormonal contraceptives: Secondary | ICD-10-CM | POA: Insufficient documentation

## 2022-08-10 DIAGNOSIS — R058 Other specified cough: Secondary | ICD-10-CM | POA: Diagnosis not present

## 2022-08-10 DIAGNOSIS — R7303 Prediabetes: Secondary | ICD-10-CM | POA: Diagnosis not present

## 2022-08-10 DIAGNOSIS — Z7951 Long term (current) use of inhaled steroids: Secondary | ICD-10-CM | POA: Insufficient documentation

## 2022-08-10 DIAGNOSIS — J069 Acute upper respiratory infection, unspecified: Secondary | ICD-10-CM

## 2022-08-10 DIAGNOSIS — Z1152 Encounter for screening for COVID-19: Secondary | ICD-10-CM | POA: Diagnosis not present

## 2022-08-10 DIAGNOSIS — Z7984 Long term (current) use of oral hypoglycemic drugs: Secondary | ICD-10-CM | POA: Diagnosis not present

## 2022-08-10 DIAGNOSIS — R0602 Shortness of breath: Secondary | ICD-10-CM | POA: Insufficient documentation

## 2022-08-10 MED ORDER — BENZONATATE 100 MG PO CAPS: 100.0000 mg | ORAL_CAPSULE | Freq: Three times a day (TID) | ORAL | 1 refills | Status: DC | PRN

## 2022-08-10 NOTE — Discharge Instructions (Signed)
It appears that you have a viral illness causing a mild flareup of your asthma.  Recommend that you use your albuterol inhaler consistently as prescribed for the next 24 hours while awake and then as needed.  Please follow-up if any symptoms persist or worsen.

## 2022-08-10 NOTE — ED Triage Notes (Signed)
Pt presents with a cough that started yesterday. States today has felt SOB with exertion.

## 2022-08-10 NOTE — ED Provider Notes (Signed)
[facility]    CSN: [id number] Arrival date & time: 08/10/22  1242      History   Chief Complaint Chief Complaint  Patient presents with   Cough    HPI Rebecca HEIDTMAN is a 27 y.o. female.   Patient presents with mild nasal congestion and cough that started yesterday.  She reports shortness of breath with exertion.  Denies any fever.  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  Denies chest pain, sore throat, ear pain, gastrointestinal symptoms.  Patient had telephone visit with PCP who prescribed benzonatate cough medication but advised her to be seen in person given history of asthma.  She has not used her albuterol inhaler for symptoms but she does use Symbicort daily.   Cough   Past Medical History:  Diagnosis Date   Anemia    Asthma    as child   Hidradenitis suppurativa of right axilla    Hypercholesteremia    Medical history non-contributory    Moderate persistent asthma without complication [id number]   Pre-diabetes     Patient Active Problem List   Diagnosis Date Noted   Hidradenitis axillaris 07/04/2022   Other abnormal glucose 01/29/2022   Elevated LDL cholesterol level 01/29/2022   Vitamin D deficiency 08/28/2020   Weight gain 11/18/2019   Moderate persistent asthma with acute exacerbation 11/18/2019   Class 2 obesity due to excess calories without serious comorbidity with body mass index (BMI) of 39.0 to 39.9 in adult 11/18/2019   Adverse food reaction 03/14/2018   Chronic rhinitis 03/14/2018   Moderate persistent asthma without complication [id number]    Past Surgical History:  Procedure Laterality Date   DENTAL SURGERY     HYDRADENITIS EXCISION Right 06/10/2014   Procedure: EXCISION OF RIGHT AXILLARY HIDRADENITIS ;  Surgeon: Donnie Mesa, MD;  Location: [place];  Service: General;  Laterality: Right;   HYDRADENITIS EXCISION Right 04/11/2019   Procedure: EXCISION RIGHT AXILLARY  HIDRADENITIS;  Surgeon: Donnie Mesa, MD;  Location: [place];  Service: General;  Laterality: Right;   TONSILLECTOMY      OB History   No obstetric history on file.      Home Medications    Prior to Admission medications   Medication Sig Start Date End Date Taking? Authorizing Provider  albuterol (PROVENTIL) (2.5 MG/3ML) 0.083% nebulizer solution Take 3 mLs (2.5 mg total) by nebulization every 6 (six) hours as needed for wheezing or shortness of breath. 05/17/21   Minette Brine, FNP  albuterol (VENTOLIN HFA) 108 (90 Base) MCG/ACT inhaler INHALE 2 PUFFS INTO THE LUNGS EVERY 6 HOURS AS NEEDED FOR WHEEZE 01/28/22   Minette Brine, FNP  benzonatate (TESSALON PERLES) 100 MG capsule Take 1 capsule (100 mg total) by mouth 3 (three) times daily as needed for cough. 08/10/22 08/10/23  Glendale Chard, MD  budesonide-formoterol [facility]) 80-4.5 MCG/ACT inhaler Inhale 2 puffs into the lungs daily. 06/27/22   Minette Brine, FNP  metFORMIN (GLUCOPHAGE) 500 MG tablet Take 1 tablet (500 mg total) by mouth 2 (two) times daily with a meal. 07/05/22   Minette Brine, FNP  montelukast (SINGULAIR) 10 MG tablet Take 1 tablet (10 mg total) by mouth daily. 06/27/22 06/27/23  Minette Brine, FNP  Vitamin D, Ergocalciferol, (DRISDOL) 1.25 MG (50000 UNIT) CAPS capsule Take 1 capsule (50,000 Units total) by mouth 2 (two) times a week. 07/04/22   Minette Brine, FNP  hyoscyamine (LEVSIN SL) 0.125 MG SL tablet Place  1 tablet (0.125 mg total) under the tongue 2 (two) times daily. 06/11/18 03/05/19  Mauri Pole, MD  medroxyPROGESTERone (DEPO-PROVERA) 150 MG/ML injection Inject 150 mg into the muscle every 3 (three) months.  03/05/19  [provider]    Family History Family History  Problem Relation Age of Onset   Hypertension Mother    Diabetes Mother    Diabetes Maternal Aunt    Colon cancer Maternal Grandmother    Breast cancer Maternal Grandmother    Cancer Father    Esophageal cancer Neg  Hx    Rectal cancer Neg Hx    Stomach cancer Neg Hx     Social History Social History   Tobacco Use   Smoking status: Never   Smokeless tobacco: Never  Vaping Use   Vaping Use: Never used  Substance Use Topics   Alcohol use: No   Drug use: No     Allergies   Cephalosporins   Review of Systems Review of Systems Per HPI  Physical Exam Triage Vital Signs ED Triage Vitals  Enc Vitals Group     BP 08/10/22 1330 123/81     Pulse Rate 08/10/22 1330 74     Resp 08/10/22 1330 17     Temp 08/10/22 1330 98.2 F (36.8 C)     Temp Source 08/10/22 1330 Oral     SpO2 08/10/22 1330 98 %     Weight --      Height --      Head Circumference --      Peak Flow --      Pain Score 08/10/22 1329 2     Pain Loc --      Pain Edu? --      Excl. in [place]? --    No data found.  Updated Vital Signs BP 123/81 (BP Location: Left Arm)   Pulse 74   Temp 98.2 F (36.8 C) (Oral)   Resp 17   LMP 07/11/2022   SpO2 98%   Visual Acuity Right Eye Distance:   Left Eye Distance:   Bilateral Distance:    Right Eye Near:   Left Eye Near:    Bilateral Near:     Physical Exam Constitutional:      General: She is not in acute distress.    Appearance: Normal appearance. She is not toxic-appearing or diaphoretic.  HENT:     Head: Normocephalic and atraumatic.     Right Ear: Tympanic membrane and ear canal normal.     Left Ear: Tympanic membrane and ear canal normal.     Nose: Congestion present.     Mouth/Throat:     Mouth: Mucous membranes are moist.     Pharynx: No posterior oropharyngeal erythema.  Eyes:     Extraocular Movements: Extraocular movements intact.     Conjunctiva/sclera: Conjunctivae normal.     Pupils: Pupils are equal, round, and reactive to light.  Cardiovascular:     Rate and Rhythm: Normal rate and regular rhythm.     Pulses: Normal pulses.     Heart sounds: Normal heart sounds.  Pulmonary:     Effort: Pulmonary effort is normal. No respiratory distress.      Breath sounds: Normal breath sounds. No stridor. No wheezing, rhonchi or rales.  Abdominal:     General: Abdomen is flat. Bowel sounds are normal.     Palpations: Abdomen is soft.  Musculoskeletal:        General: Normal range of motion.  Cervical back: Normal range of motion.  Skin:    General: Skin is warm and dry.  Neurological:     General: No focal deficit present.     Mental Status: She is alert and oriented to person, place, and time. Mental status is at baseline.  Psychiatric:        Mood and Affect: Mood normal.        Behavior: Behavior normal.      UC Treatments / Results  Labs (all labs ordered are listed, but only abnormal results are displayed) Labs Reviewed  SARS CORONAVIRUS 2 (TAT 6-24 HRS)    EKG   Radiology No results found.  Procedures Procedures (including critical care time)  Medications Ordered in UC Medications - No data to display  Initial Impression / Assessment and Plan / UC Course  I have reviewed the triage vital signs and the nursing notes.  Pertinent labs & imaging results that were available during my care of the patient were reviewed by me and considered in my medical decision making (see chart for details).     Patient presents with symptoms likely from a viral upper respiratory infection.  Patient is nontoxic appearing and not in need of emergent medical intervention.  COVID test pending.  There are no adventitious lung sounds on exam so do not think that chest imaging, emergent evaluation, steroid therapy is necessary.  Advised patient to use albuterol inhaler consistently for the next 24 hours while awake and then as needed.  Recommended patient take cough medication as prescribed by PCP as well.  COVID test pending.  Discussed supportive care.  Discussed return precautions.  Patient verbalized understanding and was agreeable with plan. Final Clinical Impressions(s) / UC Diagnoses   Final diagnoses:  Viral upper respiratory tract  infection with cough     Discharge Instructions      It appears that you have a viral illness causing a mild flareup of your asthma.  Recommend that you use your albuterol inhaler consistently as prescribed for the next 24 hours while awake and then as needed.  Please follow-up if any symptoms persist or worsen.    ED Prescriptions   None    PDMP not reviewed this encounter.   Teodora Medici, [place] 08/10/22 1349

## 2022-08-11 LAB — SARS CORONAVIRUS 2 (TAT 6-24 HRS): SARS Coronavirus 2: NEGATIVE

## 2022-08-31 ENCOUNTER — Other Ambulatory Visit (INDEPENDENT_AMBULATORY_CARE_PROVIDER_SITE_OTHER): Payer: BC Managed Care – PPO

## 2022-08-31 ENCOUNTER — Ambulatory Visit (INDEPENDENT_AMBULATORY_CARE_PROVIDER_SITE_OTHER): Payer: BC Managed Care – PPO | Admitting: *Deleted

## 2022-08-31 VITALS — BP 132/79 | HR 74 | Ht 67.0 in | Wt 251.8 lb

## 2022-08-31 DIAGNOSIS — Z3401 Encounter for supervision of normal first pregnancy, first trimester: Secondary | ICD-10-CM

## 2022-08-31 DIAGNOSIS — Z3480 Encounter for supervision of other normal pregnancy, unspecified trimester: Secondary | ICD-10-CM | POA: Insufficient documentation

## 2022-08-31 DIAGNOSIS — O3680X0 Pregnancy with inconclusive fetal viability, not applicable or unspecified: Secondary | ICD-10-CM

## 2022-08-31 DIAGNOSIS — Z3A01 Less than 8 weeks gestation of pregnancy: Secondary | ICD-10-CM

## 2022-08-31 DIAGNOSIS — Z1339 Encounter for screening examination for other mental health and behavioral disorders: Secondary | ICD-10-CM

## 2022-08-31 MED ORDER — BLOOD PRESSURE KIT DEVI: 1.0000 | 0 refills | Status: AC

## 2022-08-31 NOTE — Progress Notes (Signed)
New OB Intake  I connected with Rebecca Nicholson  on 08/31/22 at  8:15 AM EDT by In Person Visit and verified that I am speaking with the correct person using two identifiers. Nurse is located at [facility] and pt is located at [place].  I discussed the limitations, risks, security and privacy concerns of performing an evaluation and management service by telephone and the availability of in person appointments. I also discussed with the patient that there may be a patient responsible charge related to this service. The patient expressed understanding and agreed to proceed.  I explained I am completing New OB Intake today. We discussed EDD of 04/17/23 that is based on LMP of 07/11/22. Pt is G1/P0. I reviewed her allergies, medications, Medical/Surgical/OB history, and appropriate screenings. I informed her of [facility] services. [facility] information placed in AVS. Based on history, this is a low risk pregnancy.  Patient Active Problem List   Diagnosis Date Noted   Hidradenitis axillaris 07/04/2022   Other abnormal glucose 01/29/2022   Elevated LDL cholesterol level 01/29/2022   Vitamin D deficiency 08/28/2020   Weight gain 11/18/2019   Moderate persistent asthma with acute exacerbation 11/18/2019   Class 2 obesity due to excess calories without serious comorbidity with body mass index (BMI) of 39.0 to 39.9 in adult 11/18/2019   Adverse food reaction 03/14/2018   Chronic rhinitis 03/14/2018   Moderate persistent asthma without complication [id number]    Concerns addressed today  Delivery Plans Plans to deliver at [facility]. Patient given information for [facility] Healthy Baby website for more information about Women's and [place]. Patient is interested in water birth. Offered upcoming OB visit with CNM to discuss further.  MyChart/Babyscripts MyChart access verified. I explained pt will have some visits in office and some virtually. Babyscripts instructions given and order placed. Patient  verifies receipt of registration text/e-mail. Account successfully created and app downloaded.  Blood Pressure Cuff/Weight Scale Patient has private insurance; instructed to purchase blood pressure cuff and bring to first prenatal appt. Explained after first prenatal appt pt will check weekly and document in 24. Patient does not have weight scale; patient may purchase if they desire to track weight weekly in Babyscripts.  Anatomy US Explained first scheduled [place] will be around 19 weeks. Anatomy US scheduled for 19 wks at [facility]. Pt notified to arrive at [facility].  Labs Discussed Johnsie Cancel genetic screening with patient. Would like both Panorama and Horizon drawn at new OB visit. Routine prenatal labs needed.  COVID Vaccine Patient has had COVID vaccine.   Social Determinants of Health Food Insecurity: Patient denies food insecurity. WIC Referral: Patient is not interested in referral to [facility].  Transportation: Patient denies transportation needs. Childcare: Discussed no children allowed at ultrasound appointments. Offered childcare services; patient declines childcare services at this time.  Interested in [place]? If yes, send referral and doula dot phrase.   First visit review I reviewed new OB appt with patient. I explained they will have a provider visit that includes Pap, genetic screening, discussion. Explained pt will be seen by Dr. Caron Presume at first visit; encounter routed to appropriate provider. Explained that patient will be seen by pregnancy navigator following visit with provider.   Penny Pia, RN 08/31/2022  8:21 AM

## 2022-09-01 ENCOUNTER — Encounter: Payer: Self-pay | Admitting: Obstetrics and Gynecology

## 2022-09-01 DIAGNOSIS — D649 Anemia, unspecified: Secondary | ICD-10-CM | POA: Insufficient documentation

## 2022-09-01 LAB — CBC/D/PLT+RPR+RH+ABO+RUBIGG...
Antibody Screen: NEGATIVE
Basophils Absolute: 0 10*3/uL (ref 0.0–0.2)
Basos: 0 %
EOS (ABSOLUTE): 0 10*3/uL (ref 0.0–0.4)
Eos: 0 %
HCV Ab: NONREACTIVE
HIV Screen 4th Generation wRfx: NONREACTIVE
Hematocrit: 34.8 % (ref 34.0–46.6)
Hemoglobin: 10.6 g/dL — ABNORMAL LOW (ref 11.1–15.9)
Hepatitis B Surface Ag: NEGATIVE
Immature Grans (Abs): 0 10*3/uL (ref 0.0–0.1)
Immature Granulocytes: 0 %
Lymphocytes Absolute: 2 10*3/uL (ref 0.7–3.1)
Lymphs: 25 %
MCH: 22.5 pg — ABNORMAL LOW (ref 26.6–33.0)
MCHC: 30.5 g/dL — ABNORMAL LOW (ref 31.5–35.7)
MCV: 74 fL — ABNORMAL LOW (ref 79–97)
Monocytes Absolute: 0.7 10*3/uL (ref 0.1–0.9)
Monocytes: 8 %
Neutrophils Absolute: 5.3 10*3/uL (ref 1.4–7.0)
Neutrophils: 67 %
Platelets: 354 10*3/uL (ref 150–450)
RBC: 4.71 x10E6/uL (ref 3.77–5.28)
RDW: 15.8 % — ABNORMAL HIGH (ref 11.7–15.4)
RPR Ser Ql: NONREACTIVE
Rh Factor: POSITIVE
Rubella Antibodies, IGG: 1.42 index (ref >0.99)
WBC: 8 10*3/uL (ref 3.4–10.8)

## 2022-09-01 LAB — HCV INTERPRETATION

## 2022-09-02 LAB — CULTURE, OB URINE

## 2022-09-02 LAB — URINE CULTURE, OB REFLEX

## 2022-09-21 ENCOUNTER — Encounter: Payer: BC Managed Care – PPO | Admitting: Family Medicine

## 2022-09-24 ENCOUNTER — Encounter: Payer: Self-pay | Admitting: *Deleted

## 2022-09-24 ENCOUNTER — Ambulatory Visit
Admission: EM | Admit: 2022-09-24 | Discharge: 2022-09-24 | Disposition: A | Discharge status: Home / Self Care | Payer: BC Managed Care – PPO | Attending: Emergency Medicine | Admitting: Emergency Medicine

## 2022-09-24 ENCOUNTER — Other Ambulatory Visit: Payer: Self-pay

## 2022-09-24 DIAGNOSIS — S61216A Laceration without foreign body of right little finger without damage to nail, initial encounter: Secondary | ICD-10-CM

## 2022-09-24 MED ORDER — MUPIROCIN 2 % EX OINT: 1.0000 | TOPICAL_OINTMENT | Freq: Two times a day (BID) | CUTANEOUS | 0 refills | Status: AC

## 2022-09-24 NOTE — Discharge Instructions (Signed)
Laceration has been cleansed here with antiseptic, wound has been closed with skin adhesive  Skin adhesive will naturally fall off with time, do not feel, this helps to hold the wound together and allow it to heal faster  You may gently cleanse daily with diluted soapy water, pat dry, apply topical Bactroban and then a Band-Aid until area is healed  Tomato slicer is considered to be a clean kitchen utensil and therefore you will not need a tetanus injection today  May follow-up with urgent care as needed for any concerns

## 2022-09-24 NOTE — ED Triage Notes (Signed)
PT is pregnant

## 2022-09-24 NOTE — ED Triage Notes (Signed)
Pt reports she cut her RT small finger on a tomato slicer today at work. Pt called EMS to work place to asses lac because of excessive bleeding . EMS cleared pt at scene. Pt reports lac began to bleed again . Pt unsure if she needs a tetanus vac.

## 2022-09-25 DIAGNOSIS — S61216A Laceration without foreign body of right little finger without damage to nail, initial encounter: Secondary | ICD-10-CM

## 2022-09-25 NOTE — ED Provider Notes (Addendum)
[facility]    CSN: [id number] Arrival date & time: 09/24/22  1507      History   Chief Complaint Chief Complaint  Patient presents with   Extremity Laceration    HPI Rebecca Nicholson is a 27 y.o. female.   Patient presents for evaluation for a laceration to the right fifth finger that occurred today while at work.  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  EMS was called to the job where they controlled bleeding and deemed patient stable, declined transfer to the emergency department.  Endorses that she continue to work and site began bleeding again so she sought in person evaluation at the urgent care.  Currently pregnant, unsure of last tetanus.  Past Medical History:  Diagnosis Date   Anemia    Asthma    as child   Hidradenitis suppurativa of right axilla    Hypercholesteremia    Medical history non-contributory    Moderate persistent asthma without complication 03/14/2018   Pre-diabetes     Patient Active Problem List   Diagnosis Date Noted   Anemia 09/01/2022   Supervision of other normal pregnancy, antepartum 08/31/2022   Hidradenitis axillaris 07/04/2022   Other abnormal glucose 01/29/2022   Elevated LDL cholesterol level 01/29/2022   Vitamin D deficiency 08/28/2020   Weight gain 11/18/2019   Moderate persistent asthma with acute exacerbation 11/18/2019   Class 2 obesity due to excess calories without serious comorbidity with body mass index (BMI) of 39.0 to 39.9 in adult 11/18/2019   Chronic rhinitis 03/14/2018   Moderate persistent asthma without complication 03/14/2018    Past Surgical History:  Procedure Laterality Date   DENTAL SURGERY     HYDRADENITIS EXCISION Right 06/10/2014   Procedure: EXCISION OF RIGHT AXILLARY HIDRADENITIS ;  Surgeon: Manus Rudd, MD;  Location: [facility];  Service: General;  Laterality: Right;   HYDRADENITIS EXCISION Right 04/11/2019   Procedure: EXCISION RIGHT AXILLARY HIDRADENITIS;  Surgeon: Manus Rudd, MD;  Location: [facility];  Service: General;  Laterality: Right;   TONSILLECTOMY      OB History     Gravida  1   Para  0   Term  0   Preterm  0   AB  0   Living  0      SAB  0   IAB  0   Ectopic  0   Multiple  0   Live Births  0            Home Medications    Prior to Admission medications   Medication Sig Start Date End Date Taking? Authorizing Provider  mupirocin ointment (BACTROBAN) 2 % Apply 1 Application topically 2 (two) times daily. 09/24/22  Yes Shalla Bulluck R, NP  albuterol (PROVENTIL) (2.5 MG/3ML) 0.083% nebulizer solution Take 3 mLs (2.5 mg total) by nebulization every 6 (six) hours as needed for wheezing or shortness of breath. 05/17/21   Arnette Felts, FNP  albuterol (VENTOLIN HFA) 108 (90 Base) MCG/ACT inhaler INHALE 2 PUFFS INTO THE LUNGS EVERY 6 HOURS AS NEEDED FOR WHEEZE 01/28/22   Arnette Felts, FNP  benzonatate (TESSALON PERLES) 100 MG capsule Take 1 capsule (100 mg total) by mouth 3 (three) times daily as needed for cough. 08/10/22 08/10/23  Dorothyann Peng, MD  Blood Pressure Monitoring (BLOOD PRESSURE KIT) DEVI 1 Device by Does not apply route once a week. 08/31/22   Hermina Staggers, MD  budesonide-formoterol (SYMBICORT) 80-4.5 MCG/ACT inhaler Inhale 2 puffs  into the lungs daily. 06/27/22   Arnette Felts, FNP  metFORMIN (GLUCOPHAGE) 500 MG tablet Take 1 tablet (500 mg total) by mouth 2 (two) times daily with a meal. 07/05/22   Arnette Felts, FNP  montelukast (SINGULAIR) 10 MG tablet Take 1 tablet (10 mg total) by mouth daily. 06/27/22 06/27/23  Arnette Felts, FNP  Vitamin D, Ergocalciferol, (DRISDOL) 1.25 MG (50000 UNIT) CAPS capsule Take 1 capsule (50,000 Units total) by mouth 2 (two) times a week. 07/04/22   Arnette Felts, FNP  hyoscyamine (LEVSIN SL) 0.125 MG SL tablet Place 1 tablet (0.125 mg total) under the tongue 2 (two) times daily. 06/11/18 03/05/19  Napoleon Form, MD  medroxyPROGESTERone (DEPO-PROVERA) 150 MG/ML  injection Inject 150 mg into the muscle every 3 (three) months.  03/05/19  [provider]    Family History Family History  Problem Relation Age of Onset   Colon cancer Maternal Grandmother    Breast cancer Maternal Grandmother    Cancer Father    Hypertension Mother    Diabetes Mother    Diabetes Maternal Aunt    Esophageal cancer Neg Hx    Rectal cancer Neg Hx    Stomach cancer Neg Hx     Social History Social History   Tobacco Use   Smoking status: Never   Smokeless tobacco: Never  Vaping Use   Vaping Use: Never used  Substance Use Topics   Alcohol use: No   Drug use: No     Allergies   Cephalosporins   Review of Systems Review of Systems Defer to HPI    Physical Exam Triage Vital Signs ED Triage Vitals  Enc Vitals Group     BP 09/24/22 1517 114/74     Pulse Rate 09/24/22 1517 74     Resp 09/24/22 1517 20     Temp 09/24/22 1517 99.5 F (37.5 C)     Temp src --      SpO2 09/24/22 1517 97 %     Weight --      Height --      Head Circumference --      Peak Flow --      Pain Score 09/24/22 1515 8     Pain Loc --      Pain Edu? --      Excl. in GC? --    No data found.  Updated Vital Signs BP 114/74   Pulse 74   Temp 99.5 F (37.5 C)   Resp 20   LMP 07/11/2022   SpO2 97%   Visual Acuity Right Eye Distance:   Left Eye Distance:   Bilateral Distance:    Right Eye Near:   Left Eye Near:    Bilateral Near:     Physical Exam Constitutional:      Appearance: Normal appearance.  Eyes:     Extraocular Movements: Extraocular movements intact.  Pulmonary:     Effort: Pulmonary effort is normal.  Skin:    Comments: 1 cm laceration present to the distal phalanx of the right fifth finger  0.5 cm laceration present to the distal phalanx of the right fifth finger, no involvement of the nail or PIP joint, sensation intact, capillary refill less than 3  Neurological:     Mental Status: She is alert and oriented to person, place, and  time. Mental status is at baseline.      UC Treatments / Results  Labs (all labs ordered are listed, but only abnormal results are displayed)  Labs Reviewed - No data to display  EKG   Radiology No results found.  Procedures Laceration Repair  Date/Time: 09/25/2022 10:57 AM  Performed by: Valinda Hoar, NP Authorized by: Valinda Hoar, NP   Consent:    Consent obtained:  Verbal   Consent given by:  Patient Universal protocol:    Procedure explained and questions answered to patient or proxy's satisfaction: yes     Patient identity confirmed:  Verbally with patient Anesthesia:    Anesthesia method:  None Laceration details:    Location:  Finger   Finger location:  R small finger   Length (cm):  1 Exploration:    Wound exploration: entire depth of wound visualized   Treatment:    Area cleansed with:  Chlorhexidine Skin repair:    Repair method:  Tissue adhesive Approximation:    Approximation:  Close Repair type:    Repair type:  Simple Post-procedure details:    Dressing:  Non-adherent dressing and antibiotic ointment   Procedure completion:  Tolerated Laceration Repair  Date/Time: 09/25/2022 10:58 AM  Performed by: Valinda Hoar, NP Authorized by: Valinda Hoar, NP   Consent:    Consent obtained:  Verbal   Consent given by:  Patient Universal protocol:    Procedure explained and questions answered to patient or proxy's satisfaction: yes   Anesthesia:    Anesthesia method:  None Laceration details:    Location:  Finger   Finger location:  R small finger   Length (cm):  0.5 Exploration:    Wound exploration: entire depth of wound visualized   Treatment:    Area cleansed with:  Chlorhexidine Skin repair:    Repair method:  Tissue adhesive Approximation:    Approximation:  Close Repair type:    Repair type:  Simple Post-procedure details:    Dressing:  Non-adherent dressing and antibiotic ointment   Procedure completion:   Tolerated  (including critical care time)  Medications Ordered in UC Medications - No data to display  Initial Impression / Assessment and Plan / UC Course  I have reviewed the triage vital signs and the nursing notes.  Pertinent labs & imaging results that were available during my care of the patient were reviewed by me and considered in my medical decision making (see chart for details).  Laceration of right little finger without foreign body without damage to nail, initial encounter  Adhered with tissue adhesion, tolerated well, advised daily cleansing and application of topical Bactroban until healed, sent to pharmacy, may cover with a nonadherent dressing if at risk for contamination otherwise may leave open to air, given strict precautions for signs of infection to return for reevaluation, as offending agent is a kitchen utensil, will not need tetanus, discussed with patient Final Clinical Impressions(s) / UC Diagnoses   Final diagnoses:  Laceration of right little finger without foreign body without damage to nail, initial encounter     Discharge Instructions      Laceration has been cleansed here with antiseptic, wound has been closed with skin adhesive  Skin adhesive will naturally fall off with time, do not feel, this helps to hold the wound together and allow it to heal faster  You may gently cleanse daily with diluted soapy water, pat dry, apply topical Bactroban and then a Band-Aid until area is healed  Tomato slicer is considered to be a clean kitchen utensil and therefore you will not need a tetanus injection today  May follow-up with urgent care as  needed for any concerns   ED Prescriptions     Medication Sig Dispense Auth. Provider   mupirocin ointment (BACTROBAN) 2 % Apply 1 Application topically 2 (two) times daily. 22 g Valinda Hoar, NP      PDMP not reviewed this encounter.   Valinda Hoar, NP 09/25/22 1059    Salli Quarry R,  [place] 09/25/22 1100

## 2022-10-21 ENCOUNTER — Other Ambulatory Visit: Payer: BC Managed Care – PPO

## 2022-10-21 ENCOUNTER — Ambulatory Visit: Payer: BC Managed Care – PPO | Attending: Internal Medicine

## 2023-02-06 ENCOUNTER — Ambulatory Visit
Admission: EM | Admit: 2023-02-06 | Discharge: 2023-02-06 | Disposition: A | Discharge status: Home / Self Care | Payer: BC Managed Care – PPO | Attending: Family Medicine | Admitting: Family Medicine

## 2023-02-06 DIAGNOSIS — R519 Headache, unspecified: Secondary | ICD-10-CM | POA: Diagnosis not present

## 2023-02-06 DIAGNOSIS — E669 Obesity, unspecified: Secondary | ICD-10-CM | POA: Insufficient documentation

## 2023-02-06 DIAGNOSIS — S060X0A Concussion without loss of consciousness, initial encounter: Secondary | ICD-10-CM | POA: Diagnosis not present

## 2023-02-06 MED ORDER — KETOROLAC TROMETHAMINE 30 MG/ML IJ SOLN
30.0000 mg | Freq: Once | INTRAMUSCULAR | Status: AC
  Administered 2023-02-06: 30 mg via INTRAMUSCULAR

## 2023-02-06 NOTE — ED Provider Notes (Signed)
[facility]    CSN: [id number] Arrival date & time: 02/06/23  4098      History   Chief Complaint Chief Complaint  Patient presents with   Headache    HPI Rebecca Nicholson is a 27 y.o. female.    Headache Associated symptoms: no dizziness    She is here for a bad headache.  2 days ago she hit her head on the door, she fell forward and hit her head.  No LOC, but just a bad headache.  She had a headache to start with.  She is light sensative.  No n/v.  Headache is about a 6-7 at this time.  She does get headaches frequently, but usually side affects to her birth control.  She has not taken anything for her headache.        Past Medical History:  Diagnosis Date   Anemia    Asthma    as child   Chronic rhinitis 03/14/2018   Elevated LDL cholesterol level 01/29/2022   LDL 103 in March 2023. She is encouraged to limit her intake of fried foods, aim for at least 150 minutes of exercise per week and to increase fiber intake.    Hidradenitis axillaris 07/04/2022   Hidradenitis suppurativa of right axilla    Hypercholesteremia    Medical history non-contributory    Moderate persistent asthma with acute exacerbation 11/18/2019   Moderate persistent asthma without complication 03/14/2018   Pre-diabetes    Weight gain 11/18/2019    Patient Active Problem List   Diagnosis Date Noted   Obesity with body mass index 30 or greater 02/06/2023   Anemia 09/01/2022   Supervision of other normal pregnancy, antepartum 08/31/2022   Other abnormal glucose 01/29/2022   Vitamin D deficiency 08/28/2020   Morbid obesity (HCC) 11/18/2019    Past Surgical History:  Procedure Laterality Date   DENTAL SURGERY     HYDRADENITIS EXCISION Right 06/10/2014   Procedure: EXCISION OF RIGHT AXILLARY HIDRADENITIS ;  Surgeon: Manus Rudd, MD;  Location: [facility];  Service: General;  Laterality: Right;   HYDRADENITIS EXCISION Right 04/11/2019   Procedure:  EXCISION RIGHT AXILLARY HIDRADENITIS;  Surgeon: Manus Rudd, MD;  Location: [facility];  Service: General;  Laterality: Right;   TONSILLECTOMY      OB History     Gravida  1   Para  0   Term  0   Preterm  0   AB  0   Living  0      SAB  0   IAB  0   Ectopic  0   Multiple  0   Live Births  0            Home Medications    Prior to Admission medications   Medication Sig Start Date End Date Taking? Authorizing Provider  albuterol (PROVENTIL) (2.5 MG/3ML) 0.083% nebulizer solution Take 3 mLs (2.5 mg total) by nebulization every 6 (six) hours as needed for wheezing or shortness of breath. 05/17/21   Arnette Felts, FNP  albuterol (VENTOLIN HFA) 108 (90 Base) MCG/ACT inhaler INHALE 2 PUFFS INTO THE LUNGS EVERY 6 HOURS AS NEEDED FOR WHEEZE 01/28/22   Arnette Felts, FNP  benzonatate (TESSALON PERLES) 100 MG capsule Take 1 capsule (100 mg total) by mouth 3 (three) times daily as needed for cough. 08/10/22 08/10/23  Dorothyann Peng, MD  Blood Pressure Monitoring (BLOOD PRESSURE KIT) DEVI 1 Device by Does not apply route once a  week. 08/31/22   Hermina Staggers, MD  budesonide-formoterol [facility]) 80-4.5 MCG/ACT inhaler Inhale 2 puffs into the lungs daily. 06/27/22   Arnette Felts, FNP  metFORMIN (GLUCOPHAGE) 500 MG tablet Take 1 tablet (500 mg total) by mouth 2 (two) times daily with a meal. 07/05/22   Arnette Felts, FNP  montelukast (SINGULAIR) 10 MG tablet Take 1 tablet (10 mg total) by mouth daily. 06/27/22 06/27/23  Arnette Felts, FNP  mupirocin ointment (BACTROBAN) 2 % Apply 1 Application topically 2 (two) times daily. 09/24/22   White, Elita Boone, NP  Vitamin D, Ergocalciferol, (DRISDOL) 1.25 MG (50000 UNIT) CAPS capsule Take 1 capsule (50,000 Units total) by mouth 2 (two) times a week. 07/04/22   Arnette Felts, FNP  hyoscyamine (LEVSIN SL) 0.125 MG SL tablet Place 1 tablet (0.125 mg total) under the tongue 2 (two) times daily. 06/11/18 03/05/19  Napoleon Form, MD   medroxyPROGESTERone (DEPO-PROVERA) 150 MG/ML injection Inject 150 mg into the muscle every 3 (three) months.  03/05/19  [provider]    Family History Family History  Problem Relation Age of Onset   Colon cancer Maternal Grandmother    Breast cancer Maternal Grandmother    Cancer Father    Hypertension Mother    Diabetes Mother    Diabetes Maternal Aunt    Esophageal cancer Neg Hx    Rectal cancer Neg Hx    Stomach cancer Neg Hx     Social History Social History   Tobacco Use   Smoking status: Never   Smokeless tobacco: Never  Vaping Use   Vaping status: Never Used  Substance Use Topics   Alcohol use: No   Drug use: No     Allergies   Cephalosporins   Review of Systems Review of Systems  Constitutional: Negative.   HENT: Negative.    Respiratory: Negative.    Gastrointestinal: Negative.   Genitourinary: Negative.   Musculoskeletal: Negative.   Neurological:  Positive for headaches. Negative for dizziness, syncope and light-headedness.  Psychiatric/Behavioral: Negative.       Physical Exam Triage Vital Signs ED Triage Vitals  Encounter Vitals Group     BP 02/06/23 0843 (!) 144/85     Systolic BP Percentile --      Diastolic BP Percentile --      Pulse Rate 02/06/23 0843 76     Resp 02/06/23 0843 16     Temp 02/06/23 0843 98.7 F (37.1 C)     Temp Source 02/06/23 0843 Oral     SpO2 02/06/23 0843 97 %     Weight --      Height --      Head Circumference --      Peak Flow --      Pain Score 02/06/23 0842 10     Pain Loc --      Pain Education --      Exclude from Growth Chart --    No data found.  Updated Vital Signs BP (!) 144/85 (BP Location: Left Arm)   Pulse 76   Temp 98.7 F (37.1 C) (Oral)   Resp 16   LMP 01/10/2023   SpO2 97%   Breastfeeding No   Visual Acuity Right Eye Distance:   Left Eye Distance:   Bilateral Distance:    Right Eye Near:   Left Eye Near:    Bilateral Near:     Physical Exam Constitutional:       General: She is not in acute distress.  Appearance: She is well-developed. She is not ill-appearing.  HENT:     Head: Normocephalic and atraumatic.     Mouth/Throat:     Mouth: Mucous membranes are moist.  Cardiovascular:     Rate and Rhythm: Normal rate and regular rhythm.  Pulmonary:     Effort: Pulmonary effort is normal.     Breath sounds: Normal breath sounds.  Musculoskeletal:     Cervical back: Normal range of motion and neck supple.  Neurological:     Mental Status: She is alert.      UC Treatments / Results  Labs (all labs ordered are listed, but only abnormal results are displayed) Labs Reviewed - No data to display  EKG   Radiology No results found.  Procedures Procedures (including critical care time)  Medications Ordered in UC Medications - No data to display  Initial Impression / Assessment and Plan / UC Course  I have reviewed the triage vital signs and the nursing notes.  Pertinent labs & imaging results that were available during my care of the patient were reviewed by me and considered in my medical decision making (see chart for details).   Final Clinical Impressions(s) / UC Diagnoses   Final diagnoses:  Acute nonintractable headache, unspecified headache type  Concussion without loss of consciousness, initial encounter     Discharge Instructions      You were seen today for headache and possible concussion.  I have given you a shot of toradol for pain today.  You may take tylenol/motrin at home for pain as well.  Please get plenty of rest.  Avoid working, avoid TV/computers/phone.   If you have worsening pain, or develop nausea or vomiting then please go to the ER for evaluation.     ED Prescriptions   None    PDMP not reviewed this encounter.   Jannifer Franklin, MD 02/06/23 [phone]

## 2023-02-06 NOTE — Discharge Instructions (Signed)
You were seen today for headache and possible concussion.  I have given you a shot of toradol for pain today.  You may take tylenol/motrin at home for pain as well.  Please get plenty of rest.  Avoid working, avoid TV/computers/phone.   If you have worsening pain, or develop nausea or vomiting then please go to the ER for evaluation.

## 2023-02-06 NOTE — ED Triage Notes (Signed)
Patient presents to [facility] for headache, light sensitivity hit her head on wooden door on Saturday. Denies N/V. Not treating pain with any OTC meds. Concerned with concussion.

## 2023-04-23 ENCOUNTER — Emergency Department (HOSPITAL_COMMUNITY): Payer: BC Managed Care – PPO

## 2023-04-23 ENCOUNTER — Emergency Department (HOSPITAL_COMMUNITY)
Admission: EM | Admit: 2023-04-23 | Discharge: 2023-04-23 | Disposition: A | Discharge status: Home / Self Care | Payer: BC Managed Care – PPO | Attending: Emergency Medicine | Admitting: Emergency Medicine

## 2023-04-23 ENCOUNTER — Other Ambulatory Visit: Payer: Self-pay

## 2023-04-23 ENCOUNTER — Encounter (HOSPITAL_COMMUNITY): Payer: Self-pay

## 2023-04-23 DIAGNOSIS — R000 Tachycardia, unspecified: Secondary | ICD-10-CM | POA: Diagnosis not present

## 2023-04-23 DIAGNOSIS — R1084 Generalized abdominal pain: Secondary | ICD-10-CM | POA: Diagnosis not present

## 2023-04-23 DIAGNOSIS — J029 Acute pharyngitis, unspecified: Secondary | ICD-10-CM | POA: Diagnosis present

## 2023-04-23 DIAGNOSIS — E876 Hypokalemia: Secondary | ICD-10-CM

## 2023-04-23 DIAGNOSIS — J181 Lobar pneumonia, unspecified organism: Secondary | ICD-10-CM | POA: Insufficient documentation

## 2023-04-23 DIAGNOSIS — J189 Pneumonia, unspecified organism: Secondary | ICD-10-CM

## 2023-04-23 LAB — URINALYSIS, W/ REFLEX TO CULTURE (INFECTION SUSPECTED)
Bacteria, UA: NONE SEEN
Bilirubin Urine: NEGATIVE
Glucose, UA: NEGATIVE mg/dL
Ketones, ur: 80 mg/dL — AB
Nitrite: NEGATIVE
Protein, ur: 30 mg/dL — AB
Specific Gravity, Urine: 1.023 (ref 1.005–1.030)
pH: 5 (ref 5.0–8.0)

## 2023-04-23 LAB — COMPREHENSIVE METABOLIC PANEL
ALT: 12 U/L (ref 0–44)
AST: 15 U/L (ref 15–41)
Albumin: 3.9 g/dL (ref 3.5–5.0)
Alkaline Phosphatase: 46 U/L (ref 38–126)
Anion gap: 12 (ref 5–15)
BUN: 11 mg/dL (ref 6–20)
CO2: 20 mmol/L — ABNORMAL LOW (ref 22–32)
Calcium: 9.2 mg/dL (ref 8.9–10.3)
Chloride: 99 mmol/L (ref 98–111)
Creatinine, Ser: 0.75 mg/dL (ref 0.44–1.00)
GFR, Estimated: >60 mL/min (ref >60)
Glucose, Bld: 107 mg/dL — ABNORMAL HIGH (ref 70–99)
Potassium: 3.3 mmol/L — ABNORMAL LOW (ref 3.5–5.1)
Sodium: 131 mmol/L — ABNORMAL LOW (ref 135–145)
Total Bilirubin: 0.8 mg/dL (ref <1.2)
Total Protein: 8.1 g/dL (ref 6.5–8.1)

## 2023-04-23 LAB — CBC WITH DIFFERENTIAL/PLATELET
Abs Immature Granulocytes: 0.06 10*3/uL (ref 0.00–0.07)
Basophils Absolute: 0 10*3/uL (ref 0.0–0.1)
Basophils Relative: 0 %
Eosinophils Absolute: 0 10*3/uL (ref 0.0–0.5)
Eosinophils Relative: 0 %
HCT: 38.1 % (ref 36.0–46.0)
Hemoglobin: 11.5 g/dL — ABNORMAL LOW (ref 12.0–15.0)
Immature Granulocytes: 1 %
Lymphocytes Relative: 9 %
Lymphs Abs: 1 10*3/uL (ref 0.7–4.0)
MCH: 21.7 pg — ABNORMAL LOW (ref 26.0–34.0)
MCHC: 30.2 g/dL (ref 30.0–36.0)
MCV: 72 fL — ABNORMAL LOW (ref 80.0–100.0)
Monocytes Absolute: 1 10*3/uL (ref 0.1–1.0)
Monocytes Relative: 9 %
Neutro Abs: 9.2 10*3/uL — ABNORMAL HIGH (ref 1.7–7.7)
Neutrophils Relative %: 81 %
Platelets: 356 10*3/uL (ref 150–400)
RBC: 5.29 MIL/uL — ABNORMAL HIGH (ref 3.87–5.11)
RDW: 18.6 % — ABNORMAL HIGH (ref 11.5–15.5)
WBC: 11.3 10*3/uL — ABNORMAL HIGH (ref 4.0–10.5)
nRBC: 0 % (ref 0.0–0.2)

## 2023-04-23 LAB — PREGNANCY, URINE: Preg Test, Ur: NEGATIVE

## 2023-04-23 MED ORDER — LIDOCAINE VISCOUS HCL 2 % MT SOLN
15.0000 mL | Freq: Once | OROMUCOSAL | Status: AC
  Administered 2023-04-23: 15 mL via OROMUCOSAL
  Filled 2023-04-23: qty 15

## 2023-04-23 MED ORDER — ONDANSETRON HCL 4 MG/2ML IJ SOLN
4.0000 mg | Freq: Once | INTRAMUSCULAR | Status: AC
  Administered 2023-04-23: 4 mg via INTRAVENOUS
  Filled 2023-04-23: qty 2

## 2023-04-23 MED ORDER — AZITHROMYCIN 250 MG PO TABS: 250.0000 mg | ORAL_TABLET | Freq: Every day | ORAL | 0 refills | Status: DC

## 2023-04-23 MED ORDER — KETOROLAC TROMETHAMINE 15 MG/ML IJ SOLN
15.0000 mg | Freq: Once | INTRAMUSCULAR | Status: AC
  Administered 2023-04-23: 15 mg via INTRAVENOUS
  Filled 2023-04-23: qty 1

## 2023-04-23 MED ORDER — SODIUM CHLORIDE 0.9 % IV SOLN
500.0000 mg | Freq: Once | INTRAVENOUS | Status: AC
  Administered 2023-04-23: 500 mg via INTRAVENOUS
  Filled 2023-04-23: qty 5

## 2023-04-23 MED ORDER — AZITHROMYCIN 200 MG/5ML PO SUSR: 250.0000 mg | Freq: Every day | ORAL | 0 refills | Status: AC

## 2023-04-23 MED ORDER — ONDANSETRON 4 MG PO TBDP: 4.0000 mg | ORAL_TABLET | Freq: Three times a day (TID) | ORAL | 0 refills | Status: DC | PRN

## 2023-04-23 MED ORDER — ACETAMINOPHEN 325 MG PO TABS
650.0000 mg | ORAL_TABLET | Freq: Once | ORAL | Status: DC
  Filled 2023-04-23: qty 2

## 2023-04-23 MED ORDER — ACETAMINOPHEN 160 MG/5ML PO SOLN
650.0000 mg | Freq: Once | ORAL | Status: AC
  Administered 2023-04-23: 650 mg via ORAL
  Filled 2023-04-23: qty 20.3

## 2023-04-23 MED ORDER — SODIUM CHLORIDE 0.9 % IV BOLUS
1000.0000 mL | Freq: Once | INTRAVENOUS | Status: AC
  Administered 2023-04-23: 1000 mL via INTRAVENOUS

## 2023-04-23 NOTE — ED Notes (Signed)
Pt able to tolerate PO liquid meds and water now. Madilyn Hook, MD aware

## 2023-04-23 NOTE — ED Triage Notes (Addendum)
Pt has strept throat and continues to have vomiting, headache, and fevers. Tylenol taken at 6 pm.

## 2023-04-23 NOTE — ED Provider Notes (Signed)
[place] EMERGENCY DEPARTMENT AT [facility] Provider Note   CSN: [id number] Arrival date & time: 04/23/23  0018     History  Chief Complaint  Patient presents with   Sore Throat    Rebecca Nicholson is a 27 y.o. female.  The history is provided by the patient.  Sore Throat  Rebecca Nicholson is a 27 y.o. female who presents to the Emergency Department complaining of sick.  She presents to the emergency department for feeling sick since yesterday with back pain, fever, headache, chills and cough with mild sore throat.  She was seen in urgent care and diagnosed with strep throat.  Today she started vomiting teen with 4 episodes of emesis.  No diarrhea.  She feels weak in general.  No dysuria.  She is prediabetic, otherwise no significant medical problems.  LMP was November 6.  She started taking amoxicillin from urgent care.     Home Medications Prior to Admission medications   Medication Sig Start Date End Date Taking? Authorizing Provider  albuterol (PROVENTIL) (2.5 MG/3ML) 0.083% nebulizer solution Take 3 mLs (2.5 mg total) by nebulization every 6 (six) hours as needed for wheezing or shortness of breath. 05/17/21  Yes Arnette Felts, FNP  albuterol (VENTOLIN HFA) 108 (90 Base) MCG/ACT inhaler INHALE 2 PUFFS INTO THE LUNGS EVERY 6 HOURS AS NEEDED FOR WHEEZE 01/28/22  Yes Arnette Felts, FNP  amoxicillin (AMOXIL) 400 MG/5ML suspension Take 400 mg by mouth 3 (three) times daily.   Yes [provider]  azithromycin (ZITHROMAX) 200 MG/5ML suspension Take 6.3 mLs (250 mg total) by mouth daily for 4 days. 04/23/23 04/27/23 Yes Tilden Fossa, MD  budesonide-formoterol [facility]) 80-4.5 MCG/ACT inhaler Inhale 2 puffs into the lungs daily. 06/27/22  Yes Arnette Felts, FNP  metFORMIN (GLUCOPHAGE) 500 MG tablet Take 1 tablet (500 mg total) by mouth 2 (two) times daily with a meal. 07/05/22  Yes Arnette Felts, FNP  montelukast (SINGULAIR) 10 MG tablet Take 1 tablet (10 mg  total) by mouth daily. 06/27/22 06/27/23 Yes Arnette Felts, FNP  ondansetron (ZOFRAN-ODT) 4 MG disintegrating tablet Take 1 tablet (4 mg total) by mouth every 8 (eight) hours as needed. 04/23/23  Yes Tilden Fossa, MD  benzonatate (TESSALON PERLES) 100 MG capsule Take 1 capsule (100 mg total) by mouth 3 (three) times daily as needed for cough. Patient not taking: Reported on 04/23/2023 08/10/22 08/10/23  Dorothyann Peng, MD  Blood Pressure Monitoring (BLOOD PRESSURE KIT) DEVI 1 Device by Does not apply route once a week. 08/31/22   Hermina Staggers, MD  mupirocin ointment (BACTROBAN) 2 % Apply 1 Application topically 2 (two) times daily. Patient not taking: Reported on 04/23/2023 09/24/22   Valinda Hoar, NP  Vitamin D, Ergocalciferol, (DRISDOL) 1.25 MG (50000 UNIT) CAPS capsule Take 1 capsule (50,000 Units total) by mouth 2 (two) times a week. Patient not taking: Reported on 04/23/2023 07/04/22   Arnette Felts, FNP  hyoscyamine (LEVSIN SL) 0.125 MG SL tablet Place 1 tablet (0.125 mg total) under the tongue 2 (two) times daily. 06/11/18 03/05/19  Napoleon Form, MD  medroxyPROGESTERone (DEPO-PROVERA) 150 MG/ML injection Inject 150 mg into the muscle every 3 (three) months.  03/05/19  [provider]      Allergies    Cephalosporins    Review of Systems   Review of Systems  All other systems reviewed and are negative.   Physical Exam Updated Vital Signs BP 123/80   Pulse 91   Temp 100  F (37.8 C) (Oral)   Resp 16   Ht 5\' 7"  (1.702 m)   Wt 114.2 kg   LMP 07/11/2022   SpO2 100%   BMI 39.43 kg/m  Physical Exam Vitals and nursing note reviewed.  Constitutional:      Appearance: She is well-developed.  HENT:     Head: Normocephalic and atraumatic.     Comments: Mild erythema in posterior OP Cardiovascular:     Rate and Rhythm: Regular rhythm. Tachycardia present.     Heart sounds: No murmur heard. Pulmonary:     Effort: Pulmonary effort is normal. No respiratory  distress.     Breath sounds: Normal breath sounds.  Abdominal:     Palpations: Abdomen is soft.     Tenderness: There is no guarding or rebound.     Comments: Mild generalized abdominal tenderness  Musculoskeletal:        General: No tenderness.  Skin:    General: Skin is warm and dry.  Neurological:     Mental Status: She is alert and oriented to person, place, and time.  Psychiatric:        Behavior: Behavior normal.     ED Results / Procedures / Treatments   Labs (all labs ordered are listed, but only abnormal results are displayed) Labs Reviewed  COMPREHENSIVE METABOLIC PANEL - Abnormal; Notable for the following components:      Result Value   Sodium 131 (*)    Potassium 3.3 (*)    CO2 20 (*)    Glucose, Bld 107 (*)    All other components within normal limits  CBC WITH DIFFERENTIAL/PLATELET - Abnormal; Notable for the following components:   WBC 11.3 (*)    RBC 5.29 (*)    Hemoglobin 11.5 (*)    MCV 72.0 (*)    MCH 21.7 (*)    RDW 18.6 (*)    Neutro Abs 9.2 (*)    All other components within normal limits  URINALYSIS, W/ REFLEX TO CULTURE (INFECTION SUSPECTED) - Abnormal; Notable for the following components:   APPearance HAZY (*)    Hgb urine dipstick SMALL (*)    Ketones, ur 80 (*)    Protein, ur 30 (*)    Leukocytes,Ua MODERATE (*)    All other components within normal limits  PREGNANCY, URINE    EKG None  Radiology DG Chest Port 1 View  Result Date: 04/23/2023 CLINICAL DATA:  Cough, fever EXAM: PORTABLE CHEST 1 VIEW COMPARISON:  None Available. FINDINGS: There is focal consolidation within the basilar right upper lobe in keeping with changes of acute lobar pneumonia in the appropriate clinical setting. No pneumothorax or pleural effusion. Cardiac size within normal limits. Pulmonary vascularity is normal. No acute bone abnormality. IMPRESSION: 1. Right upper lobe pneumonia.  May Electronically Signed   By: Helyn Numbers M.D.   On: 04/23/2023 02:43     Procedures Procedures    Medications Ordered in ED Medications  sodium chloride 0.9 % bolus 1,000 mL (1,000 mLs Intravenous New Bag/Given 04/23/23 0141)  ketorolac (TORADOL) 15 MG/ML injection 15 mg (15 mg Intravenous Given 04/23/23 0141)  ondansetron (ZOFRAN) injection 4 mg (4 mg Intravenous Given 04/23/23 0140)  lidocaine (XYLOCAINE) 2 % viscous mouth solution 15 mL (15 mLs Mouth/Throat Given 04/23/23 0241)  acetaminophen (TYLENOL) 160 MG/5ML solution 650 mg (650 mg Oral Given 04/23/23 0241)  azithromycin (ZITHROMAX) 500 mg in sodium chloride 0.9 % 250 mL IVPB (0 mg Intravenous Stopped 04/23/23 0553)  ondansetron (ZOFRAN) injection  4 mg (4 mg Intravenous Given 04/23/23 0544)    ED Course/ Medical Decision Making/ A&P                                 Medical Decision Making Amount and/or Complexity of Data Reviewed Labs: ordered. Radiology: ordered.  Risk OTC drugs. Prescription drug management.   Patient here for evaluation of fever, sore throat, cough and vomiting.  She is febrile on ED arrival but nontoxic-appearing.  She has already been started on amoxicillin for strep pharyngitis.  Overall her throat exam is not consistent with PTA, RPA or epiglottitis, she does have mild posterior oropharyngeal erythema.  Chest x-ray is concerning for pneumonia, images personally reviewed and interpreted, agree with radiologist interpretation.  CBC with mild leukocytosis.  BMP with mild hyponatremia, hypokalemia.  She was treated with IV fluid hydration.  She was also treated with antibiotics for pneumonia.  After treatment in the emergency department she is able to tolerate p.o.  Plan to discharge home with close outpatient follow-up and return precautions.  Recommend that she continue her amoxicillin and add the azithromycin.        Final Clinical Impression(s) / ED Diagnoses Final diagnoses:  Community acquired pneumonia of right upper lobe of lung  Hypokalemia    Rx / DC  Orders ED Discharge Orders          Ordered    azithromycin (ZITHROMAX) 250 MG tablet  Daily,   Status:  Discontinued        04/23/23 0415    ondansetron (ZOFRAN-ODT) 4 MG disintegrating tablet  Every 8 hours PRN        04/23/23 0415    azithromycin (ZITHROMAX) 200 MG/5ML suspension  Daily        04/23/23 0416              Tilden Fossa, MD 04/23/23 [phone]

## 2023-04-23 NOTE — ED Notes (Signed)
Pt attempted PO challenge but reports she was only able to take a small sip. Madilyn Hook, MD aware.

## 2023-04-25 ENCOUNTER — Ambulatory Visit: Payer: BC Managed Care – PPO | Admitting: Internal Medicine

## 2023-04-25 ENCOUNTER — Encounter: Payer: Self-pay | Admitting: Internal Medicine

## 2023-04-25 ENCOUNTER — Telehealth: Payer: Self-pay

## 2023-04-25 VITALS — BP 128/80 | HR 86 | Temp 99.0°F | Ht 67.0 in | Wt 238.8 lb

## 2023-04-25 DIAGNOSIS — R030 Elevated blood-pressure reading, without diagnosis of hypertension: Secondary | ICD-10-CM | POA: Diagnosis not present

## 2023-04-25 DIAGNOSIS — Z6837 Body mass index (BMI) 37.0-37.9, adult: Secondary | ICD-10-CM

## 2023-04-25 DIAGNOSIS — J4540 Moderate persistent asthma, uncomplicated: Secondary | ICD-10-CM | POA: Diagnosis not present

## 2023-04-25 DIAGNOSIS — J189 Pneumonia, unspecified organism: Secondary | ICD-10-CM

## 2023-04-25 DIAGNOSIS — J020 Streptococcal pharyngitis: Secondary | ICD-10-CM | POA: Diagnosis not present

## 2023-04-25 DIAGNOSIS — E66812 Obesity, class 2: Secondary | ICD-10-CM

## 2023-04-25 MED ORDER — ALBUTEROL SULFATE (2.5 MG/3ML) 0.083% IN NEBU: 2.5000 mg | INHALATION_SOLUTION | RESPIRATORY_TRACT | 2 refills | Status: AC | PRN

## 2023-04-25 MED ORDER — FLUCONAZOLE 150 MG PO TABS: ORAL_TABLET | ORAL | 0 refills | Status: DC

## 2023-04-25 NOTE — Transitions of Care (Post Inpatient/ED Visit) (Cosign Needed)
04/25/2023  Name: Rebecca Nicholson MRN: [id number] DOB: 11-17-1995  Today's TOC FU Call Status:   Patient's Name and Date of Birth confirmed.  Transition Care Management Follow-up Telephone Call Date of Discharge: 04/23/23 Discharge Facility: [facility]) Type of Discharge: Emergency Department Reason for ED Visit: Respiratory Respiratory Diagnosis: Pnuemonia How have you been since you were released from the hospital?: Better Any questions or concerns?: No  Items Reviewed: Did you receive and understand the discharge instructions provided?: Yes Medications obtained,verified, and reconciled?: Yes (Medications Reviewed) Any new allergies since your discharge?: No Dietary orders reviewed?: NA Do you have support at home?: Yes People in Home: parent(s), sibling(s)  Medications Reviewed Today: Medications Reviewed Today     Reviewed by Coolidge Breeze, CMA (Certified Medical Assistant) on 04/25/23 at 1304  Med List Status: <None>   Medication Order Taking? Sig Documenting Provider Last Dose Status Informant  albuterol (PROVENTIL) (2.5 MG/3ML) 0.083% nebulizer solution [id number] Yes Take 3 mLs (2.5 mg total) by nebulization every 6 (six) hours as needed for wheezing or shortness of breath. Arnette Felts, FNP Taking Active Self  albuterol (VENTOLIN HFA) 108 (90 Base) MCG/ACT inhaler [id number] Yes INHALE 2 PUFFS INTO THE LUNGS EVERY 6 HOURS AS NEEDED FOR WHEEZE Arnette Felts, FNP Taking Active Self  amoxicillin (AMOXIL) 400 MG/5ML suspension [id number] Yes Take 400 mg by mouth 3 (three) times daily. [provider] Taking Active Self           Med Note Cyndie Chime, Merlyn Lot Apr 23, 2023  2:24 AM) ABT Start Date 04/22/23  azithromycin (ZITHROMAX) 200 MG/5ML suspension [id number] Yes Take 6.3 mLs (250 mg total) by mouth daily for 4 days. Tilden Fossa, MD Taking Active   benzonatate (TESSALON PERLES) 100 MG capsule [id number] No Take 1 capsule (100 mg total)  by mouth 3 (three) times daily as needed for cough.  Patient not taking: Reported on 04/23/2023   Dorothyann Peng, MD Not Taking Active Self  Blood Pressure Monitoring (BLOOD PRESSURE KIT) DEVI [id number] Yes 1 Device by Does not apply route once a week. Hermina Staggers, MD Taking Active Self  budesonide-formoterol [facility]) 80-4.5 MCG/ACT inhaler [id number] Yes Inhale 2 puffs into the lungs daily. Arnette Felts, FNP Taking Active Self    Discontinued 03/05/19 1606     Discontinued 03/05/19 1606          Med Note (POLK-JONES, Narda Amber Jun 14, 2018 10:50 AM) November or December  metFORMIN (GLUCOPHAGE) 500 MG tablet [id number] Yes Take 1 tablet (500 mg total) by mouth 2 (two) times daily with a meal. Arnette Felts, FNP Taking Active Self  montelukast (SINGULAIR) 10 MG tablet [id number] Yes Take 1 tablet (10 mg total) by mouth daily. Arnette Felts, FNP Taking Active Self  mupirocin ointment (BACTROBAN) 2 % [id number] No Apply 1 Application topically 2 (two) times daily.  Patient not taking: Reported on 04/23/2023   Valinda Hoar, NP Not Taking Active Self  ondansetron (ZOFRAN-ODT) 4 MG disintegrating tablet [id number] Yes Take 1 tablet (4 mg total) by mouth every 8 (eight) hours as needed. Tilden Fossa, MD Taking Active   Vitamin D, Ergocalciferol, (DRISDOL) 1.25 MG (50000 UNIT) CAPS capsule [id number] No Take 1 capsule (50,000 Units total) by mouth 2 (two) times a week.  Patient not taking: Reported on 04/23/2023   Arnette Felts, FNP Not Taking Active Self            Home Care and Equipment/Supplies: Were Home  Health Services Ordered?: No Any new equipment or medical supplies ordered?: No  Functional Questionnaire: Do you need assistance with bathing/showering or dressing?: No Do you need assistance with meal preparation?: No Do you need assistance with eating?: No Do you have difficulty maintaining continence: No Do you need assistance with getting out of bed/getting out of  a chair/moving?: No Do you have difficulty managing or taking your medications?: No  Follow up appointments reviewed: PCP Follow-up appointment confirmed?: Yes Date of PCP follow-up appointment?: 04/25/23 Follow-up Provider: Dorothyann Peng Specialist [facility] Follow-up appointment confirmed?: NA Do you need transportation to your follow-up appointment?: No Do you understand care options if your condition(s) worsen?: Yes-patient verbalized understanding    SIGNATURE Randa Lynn, CMA

## 2023-04-25 NOTE — Patient Instructions (Signed)

## 2023-04-25 NOTE — Progress Notes (Signed)
I,Victoria T Deloria Lair, CMA,acting as a Neurosurgeon for [name], MD.,have documented all relevant documentation on the behalf of [name], MD,as directed by  [name], MD while in the presence of [name], MD.  Subjective:  Patient ID: Rebecca Nicholson , female    DOB: 09-Jan-1996 , 27 y.o.   MRN: [id number]  Chief Complaint  Patient presents with   Follow-up    HPI  Patient presents today for ED follow up. She presented to [facility] ED on 04/23/2023 for further evaluation of sore throat. She states she felt sick the day prior w/ complaints of back pain, fever, headache, chills and cough with mild sore throat.  She was seen in urgent care and diagnosed with strep throat.  She states she went to ER because of worsening nausea and vomiting.  There was no diarrhea.  She feels weak in general.  No dysuria.   The day she went to ER, she was already taking amoxicillin from urgent care.  CXR was significant for RUL pneumonia. She was prescribed Zpak and advised to take along with amoxicillin.  Today, she feels much better.  Today she admits feeling better. However, she still has a cough.  The n/v has resolved.       Past Medical History:  Diagnosis Date   Anemia    Asthma    as child   Chronic rhinitis 03/14/2018   Elevated LDL cholesterol level 01/29/2022   LDL 103 in March 2023. She is encouraged to limit her intake of fried foods, aim for at least 150 minutes of exercise per week and to increase fiber intake.    Hidradenitis axillaris 07/04/2022   Hidradenitis suppurativa of right axilla    Hypercholesteremia    Medical history non-contributory    Moderate persistent asthma with acute exacerbation 11/18/2019   Moderate persistent asthma without complication 03/14/2018   Pre-diabetes    Weight gain 11/18/2019     Family History  Problem Relation Age of Onset   Colon cancer Maternal Grandmother    Breast cancer Maternal Grandmother    Cancer Father     Hypertension Mother    Diabetes Mother    Diabetes Maternal Aunt    Esophageal cancer Neg Hx    Rectal cancer Neg Hx    Stomach cancer Neg Hx      Current Outpatient Medications:    albuterol (PROVENTIL) (2.5 MG/3ML) 0.083% nebulizer solution, Take 3 mLs (2.5 mg total) by nebulization every 6 (six) hours as needed for wheezing or shortness of breath., Disp: 30 mL, Rfl: 12   albuterol (PROVENTIL) (2.5 MG/3ML) 0.083% nebulizer solution, Take 3 mLs (2.5 mg total) by nebulization every 4 (four) hours as needed for wheezing or shortness of breath., Disp: 75 mL, Rfl: 2   albuterol (VENTOLIN HFA) 108 (90 Base) MCG/ACT inhaler, INHALE 2 PUFFS INTO THE LUNGS EVERY 6 HOURS AS NEEDED FOR WHEEZE, Disp: 18 each, Rfl: 6   amoxicillin (AMOXIL) 400 MG/5ML suspension, Take 400 mg by mouth 3 (three) times daily., Disp: , Rfl:    Blood Pressure Monitoring (BLOOD PRESSURE KIT) DEVI, 1 Device by Does not apply route once a week., Disp: 1 each, Rfl: 0   budesonide-formoterol (SYMBICORT) 80-4.5 MCG/ACT inhaler, Inhale 2 puffs into the lungs daily., Disp: 1 each, Rfl: 11   fluconazole (DIFLUCAN) 150 MG tablet, Take one tab po today, repeat in 48 hours, Disp: 2 tablet, Rfl: 0   metFORMIN (GLUCOPHAGE) 500 MG tablet, Take 1  tablet (500 mg total) by mouth 2 (two) times daily with a meal., Disp: 60 tablet, Rfl: 2   montelukast (SINGULAIR) 10 MG tablet, Take 1 tablet (10 mg total) by mouth daily., Disp: 90 tablet, Rfl: 1   ondansetron (ZOFRAN-ODT) 4 MG disintegrating tablet, Take 1 tablet (4 mg total) by mouth every 8 (eight) hours as needed., Disp: 12 tablet, Rfl: 0   benzonatate (TESSALON PERLES) 100 MG capsule, Take 1 capsule (100 mg total) by mouth 3 (three) times daily as needed for cough. (Patient not taking: Reported on 04/23/2023), Disp: 30 capsule, Rfl: 1   mupirocin ointment (BACTROBAN) 2 %, Apply 1 Application topically 2 (two) times daily. (Patient not taking: Reported on 04/23/2023), Disp: 22 g, Rfl: 0    Vitamin D, Ergocalciferol, (DRISDOL) 1.25 MG (50000 UNIT) CAPS capsule, Take 1 capsule (50,000 Units total) by mouth 2 (two) times a week. (Patient not taking: Reported on 04/23/2023), Disp: 24 capsule, Rfl: 1   Allergies  Allergen Reactions   Cephalosporins Rash     Review of Systems  Constitutional: Negative.   Respiratory:  Positive for cough.   Cardiovascular: Negative.   Gastrointestinal: Negative.   Neurological: Negative.   Psychiatric/Behavioral: Negative.       Today's Vitals   04/25/23 1452  BP: 128/80  Pulse: 86  Temp: 99 F (37.2 C)  SpO2: 98%  Weight: 238 lb 12.8 oz (108.3 kg)  Height: 5\' 7"  (1.702 m)   Body mass index is 37.4 kg/m.  Wt Readings from Last 3 Encounters:  04/25/23 238 lb 12.8 oz (108.3 kg)  04/23/23 251 lb 12.3 oz (114.2 kg)  08/31/22 251 lb 12.8 oz (114.2 kg)    BP Readings from Last 3 Encounters:  04/25/23 128/80  04/23/23 123/80  02/06/23 (!) 144/85     Objective:  Physical Exam Vitals and nursing note reviewed.  Constitutional:      Appearance: Normal appearance. She is obese.  HENT:     Head: Normocephalic and atraumatic.     Right Ear: Tympanic membrane, ear canal and external ear normal. There is no impacted cerumen.     Left Ear: Tympanic membrane, ear canal and external ear normal. There is no impacted cerumen.  Eyes:     Extraocular Movements: Extraocular movements intact.  Cardiovascular:     Rate and Rhythm: Normal rate and regular rhythm.     Heart sounds: Normal heart sounds.  Pulmonary:     Effort: Pulmonary effort is normal.     Comments: Decreased breath sounds Musculoskeletal:     Cervical back: Normal range of motion.  Skin:    General: Skin is warm.  Neurological:     General: No focal deficit present.     Mental Status: She is alert.  Psychiatric:        Mood and Affect: Mood normal.        Behavior: Behavior normal.         Assessment And Plan:  Community acquired pneumonia of right upper lobe of  lung Assessment & Plan: ED records reviewed in full detail. She is encouraged to avoid dairy, take full antibiotic course and to stay well hydrated. She will let me know if her symptoms persist.    Strep throat Assessment & Plan: Both Urgent care and ED notes were reviewed in detail. Encouraged to complete full course of abx.    Moderate persistent asthma without complication Assessment & Plan: Chronic, she does not have nebulizer. She was given nebulizer to use  nightly. Insurance information will be sent to DME company.    Elevated blood pressure reading Assessment & Plan: Recent BP readings reviewed, Sept 2024 reading was 144/85. It is improved today, she is encouraged to decrease her intake of packaged foods and to avoid adding salt to her foods. Will re-evaluate at her next visit.    Class 2 severe obesity due to excess calories with serious comorbidity and body mass index (BMI) of 37.0 to 37.9 in adult [facility]) Assessment & Plan: She is encouraged to strive for BMI less than 30 to decrease cardiac risk. Advised to aim for at least 150 minutes of exercise per week.    Other orders -     Albuterol Sulfate; Take 3 mLs (2.5 mg total) by nebulization every 4 (four) hours as needed for wheezing or shortness of breath.  Dispense: 75 mL; Refill: 2 -     Fluconazole; Take one tab po today, repeat in 48 hours  Dispense: 2 tablet; Refill: 0  She is encouraged to strive for BMI less than 30 to decrease cardiac risk. Advised to aim for at least 150 minutes of exercise per week.    Return if symptoms worsen or fail to improve.  Patient was given opportunity to ask questions. Patient verbalized understanding of the plan and was able to repeat key elements of the plan. All questions were answered to their satisfaction.    I, [name], MD, have reviewed all documentation for this visit. The documentation on 04/25/23 for the exam, diagnosis, procedures, and orders are all accurate and  complete.   IF YOU HAVE BEEN REFERRED TO A SPECIALIST, IT MAY TAKE 1-2 WEEKS TO SCHEDULE/PROCESS THE REFERRAL. IF YOU HAVE NOT HEARD FROM US/SPECIALIST IN TWO WEEKS, PLEASE GIVE [place] A CALL AT [phone] X [phone].   THE PATIENT IS ENCOURAGED TO PRACTICE SOCIAL DISTANCING DUE TO THE COVID-19 PANDEMIC.

## 2023-05-01 DIAGNOSIS — J020 Streptococcal pharyngitis: Secondary | ICD-10-CM | POA: Insufficient documentation

## 2023-05-01 DIAGNOSIS — R030 Elevated blood-pressure reading, without diagnosis of hypertension: Secondary | ICD-10-CM | POA: Insufficient documentation

## 2023-05-01 DIAGNOSIS — E66813 Obesity, class 3: Secondary | ICD-10-CM | POA: Insufficient documentation

## 2023-05-01 DIAGNOSIS — J189 Pneumonia, unspecified organism: Secondary | ICD-10-CM | POA: Insufficient documentation

## 2023-05-01 DIAGNOSIS — J4540 Moderate persistent asthma, uncomplicated: Secondary | ICD-10-CM | POA: Insufficient documentation

## 2023-05-01 NOTE — Assessment & Plan Note (Signed)
Chronic, she does not have nebulizer. She was given nebulizer to use nightly. Insurance information will be sent to DME company.

## 2023-05-01 NOTE — Assessment & Plan Note (Signed)
 She is encouraged to strive for BMI less than 30 to decrease cardiac risk. Advised to aim for at least 150 minutes of exercise per week.

## 2023-05-01 NOTE — Assessment & Plan Note (Signed)
Both Urgent care and ED notes were reviewed in detail. Encouraged to complete full course of abx.

## 2023-05-01 NOTE — Assessment & Plan Note (Signed)
ED records reviewed in full detail. She is encouraged to avoid dairy, take full antibiotic course and to stay well hydrated. She will let me know if her symptoms persist.

## 2023-05-01 NOTE — Assessment & Plan Note (Signed)
Recent BP readings reviewed, Sept 2024 reading was 144/85. It is improved today, she is encouraged to decrease her intake of packaged foods and to avoid adding salt to her foods. Will re-evaluate at her next visit.

## 2023-07-05 ENCOUNTER — Ambulatory Visit (INDEPENDENT_AMBULATORY_CARE_PROVIDER_SITE_OTHER): Payer: 59 | Admitting: Internal Medicine

## 2023-07-05 ENCOUNTER — Encounter: Payer: Self-pay | Admitting: Internal Medicine

## 2023-07-05 ENCOUNTER — Encounter: Payer: BC Managed Care – PPO | Admitting: Internal Medicine

## 2023-07-05 VITALS — BP 118/80 | HR 78 | Temp 98.2°F | Ht 67.0 in | Wt 255.8 lb

## 2023-07-05 DIAGNOSIS — E66813 Obesity, class 3: Secondary | ICD-10-CM

## 2023-07-05 DIAGNOSIS — D500 Iron deficiency anemia secondary to blood loss (chronic): Secondary | ICD-10-CM

## 2023-07-05 DIAGNOSIS — Z0000 Encounter for general adult medical examination without abnormal findings: Secondary | ICD-10-CM | POA: Diagnosis not present

## 2023-07-05 DIAGNOSIS — J4540 Moderate persistent asthma, uncomplicated: Secondary | ICD-10-CM | POA: Diagnosis not present

## 2023-07-05 DIAGNOSIS — Z6841 Body Mass Index (BMI) 40.0 and over, adult: Secondary | ICD-10-CM

## 2023-07-05 DIAGNOSIS — R7303 Prediabetes: Secondary | ICD-10-CM | POA: Diagnosis not present

## 2023-07-05 MED ORDER — BUDESONIDE-FORMOTEROL FUMARATE 80-4.5 MCG/ACT IN AERO: 2.0000 | INHALATION_SPRAY | Freq: Every day | RESPIRATORY_TRACT | 3 refills | Status: DC

## 2023-07-05 MED ORDER — ALBUTEROL SULFATE HFA 108 (90 BASE) MCG/ACT IN AERS
2.0000 | INHALATION_SPRAY | Freq: Four times a day (QID) | RESPIRATORY_TRACT | 3 refills | Status: AC | PRN
Start: 2023-07-05 — End: ?

## 2023-07-05 NOTE — Progress Notes (Signed)
I,Victoria T Deloria Lair, CMA,acting as a Neurosurgeon for [name], MD.,have documented all relevant documentation on the behalf of [name], MD,as directed by  [name], MD while in the presence of [name], MD.  Subjective:    Patient ID: Rebecca Nicholson , female    DOB: 08-01-1995 , 28 y.o.   MRN: [id number]  Chief Complaint  Patient presents with   Annual Exam    HPI  Patient presents today for annual exam.  She is followed by Henreitta Leber, PA for her pelvic exams. She plans to schedule an appt in the near future for GYN exam. She denies having any headache, chest pain & sob. She has no specific questions or concerns.        Past Medical History:  Diagnosis Date   Anemia    Asthma    as child   Chronic rhinitis 03/14/2018   Elevated LDL cholesterol level 01/29/2022   LDL 103 in March 2023. She is encouraged to limit her intake of fried foods, aim for at least 150 minutes of exercise per week and to increase fiber intake.    Hidradenitis axillaris 07/04/2022   Hidradenitis suppurativa of right axilla    Hypercholesteremia    Medical history non-contributory    Moderate persistent asthma with acute exacerbation 11/18/2019   Moderate persistent asthma without complication 03/14/2018   Pre-diabetes    Weight gain 11/18/2019     Family History  Problem Relation Age of Onset   Colon cancer Maternal Grandmother    Breast cancer Maternal Grandmother    Cancer Father    Hypertension Mother    Diabetes Mother    Diabetes Maternal Aunt    Esophageal cancer Neg Hx    Rectal cancer Neg Hx    Stomach cancer Neg Hx      Current Outpatient Medications:    albuterol (PROVENTIL) (2.5 MG/3ML) 0.083% nebulizer solution, Take 3 mLs (2.5 mg total) by nebulization every 4 (four) hours as needed for wheezing or shortness of breath., Disp: 75 mL, Rfl: 2   Blood Pressure Monitoring (BLOOD PRESSURE KIT) DEVI, 1 Device by Does not apply route once a week., Disp: 1  each, Rfl: 0   fluconazole (DIFLUCAN) 150 MG tablet, Take one tab po today, repeat in 48 hours, Disp: 2 tablet, Rfl: 0   metFORMIN (GLUCOPHAGE) 500 MG tablet, Take 1 tablet (500 mg total) by mouth 2 (two) times daily with a meal., Disp: 60 tablet, Rfl: 2   albuterol (VENTOLIN HFA) 108 (90 Base) MCG/ACT inhaler, Inhale 2 puffs into the lungs every 6 (six) hours as needed for wheezing or shortness of breath., Disp: 3 each, Rfl: 3   budesonide-formoterol (SYMBICORT) 80-4.5 MCG/ACT inhaler, Inhale 2 puffs into the lungs 2 (two) times daily., Disp: 3 each, Rfl: 3   montelukast (SINGULAIR) 10 MG tablet, Take 1 tablet (10 mg total) by mouth daily., Disp: 90 tablet, Rfl: 1   mupirocin ointment (BACTROBAN) 2 %, Apply 1 Application topically 2 (two) times daily. (Patient not taking: Reported on 04/23/2023), Disp: 22 g, Rfl: 0   Vitamin D, Ergocalciferol, (DRISDOL) 1.25 MG (50000 UNIT) CAPS capsule, Take 1 capsule (50,000 Units total) by mouth 2 (two) times a week. (Patient not taking: Reported on 04/23/2023), Disp: 24 capsule, Rfl: 1   Allergies  Allergen Reactions   Cephalosporins Rash      The patient states she uses none for birth control. Patient's last menstrual period was 06/05/2023 (exact date).. Negative  for Dysmenorrhea. Negative for: breast discharge, breast lump(s), breast pain and breast self exam. Associated symptoms include abnormal vaginal bleeding. Pertinent negatives include abnormal bleeding (hematology), anxiety, decreased libido, depression, difficulty falling sleep, dyspareunia, history of infertility, nocturia, sexual dysfunction, sleep disturbances, urinary incontinence, urinary urgency, vaginal discharge and vaginal itching. Diet regular.The patient states her exercise level is  intermittent.  . The patient's tobacco use is:  Social History   Tobacco Use  Smoking Status Never  Smokeless Tobacco Never  . She has been exposed to passive smoke. The patient's alcohol use is:  Social  History   Substance and Sexual Activity  Alcohol Use No     Review of Systems  Constitutional: Negative.   HENT: Negative.    Eyes: Negative.   Respiratory: Negative.    Cardiovascular: Negative.   Gastrointestinal: Negative.   Endocrine: Negative.   Genitourinary: Negative.   Musculoskeletal: Negative.   Skin: Negative.   Allergic/Immunologic: Negative.   Neurological: Negative.   Psychiatric/Behavioral: Negative.       Today's Vitals   07/05/23 1500  BP: 118/80  Pulse: 78  Temp: 98.2 F (36.8 C)  SpO2: 98%  Weight: 255 lb 12.8 oz (116 kg)  Height: 5\' 7"  (1.702 m)   Body mass index is 40.06 kg/m.  Wt Readings from Last 3 Encounters:  07/05/23 255 lb 12.8 oz (116 kg)  04/25/23 238 lb 12.8 oz (108.3 kg)  04/23/23 251 lb 12.3 oz (114.2 kg)     Objective:  Physical Exam Vitals and nursing note reviewed.  Constitutional:      General: She is not in acute distress.    Appearance: Normal appearance. She is well-developed. She is obese.  HENT:     Head: Normocephalic and atraumatic.     Right Ear: Hearing, tympanic membrane, ear canal and external ear normal. There is no impacted cerumen.     Left Ear: Hearing, tympanic membrane, ear canal and external ear normal. There is no impacted cerumen.  Eyes:     General: Lids are normal.     Extraocular Movements: Extraocular movements intact.     Conjunctiva/sclera: Conjunctivae normal.     Pupils: Pupils are equal, round, and reactive to light.     Funduscopic exam:    Right eye: No papilledema.        Left eye: No papilledema.  Neck:     Thyroid: No thyroid mass.     Vascular: No carotid bruit.  Cardiovascular:     Rate and Rhythm: Normal rate and regular rhythm.     Pulses: Normal pulses.     Heart sounds: Normal heart sounds. No murmur heard. Pulmonary:     Effort: Pulmonary effort is normal.     Breath sounds: Normal breath sounds.  Chest:  Breasts:    Tanner Score is 5.     Right: Normal.     Left:  Normal.  Abdominal:     General: Bowel sounds are normal. There is no distension.     Palpations: Abdomen is soft.     Tenderness: There is no abdominal tenderness.     Comments: Obese, soft.  Difficult to assess organomegaly  Genitourinary:    Comments: Deferred Musculoskeletal:        General: No swelling. Normal range of motion.     Cervical back: Full passive range of motion without pain, normal range of motion and neck supple.     Right lower leg: No edema.     Left lower leg:  No edema.  Skin:    General: Skin is warm and dry.     Capillary Refill: Capillary refill takes less than 2 seconds.  Neurological:     General: No focal deficit present.     Mental Status: She is alert and oriented to person, place, and time.     Cranial Nerves: No cranial nerve deficit.     Sensory: No sensory deficit.  Psychiatric:        Mood and Affect: Mood normal.        Behavior: Behavior normal.        Thought Content: Thought content normal.        Judgment: Judgment normal.         Assessment And Plan:     Routine general medical examination at health care facility Assessment & Plan: A full exam was performed.  Importance of monthly self breast exams was discussed with the patient.  She is advised to get 30-45 minutes of regular exercise, no less than four to five days per week. Both weight-bearing and aerobic exercises are recommended.  She is advised to follow a healthy diet with at least six fruits/veggies per day, decrease intake of red meat and other saturated fats and to increase fish intake to twice weekly.  Meats/fish should not be fried -- baked, boiled or broiled is preferable. It is also important to cut back on your sugar intake.  Be sure to read labels - try to avoid anything with added sugar, high fructose corn syrup or other sweeteners.  If you must use a sweetener, you can try stevia or monkfruit.  It is also important to avoid artificially sweetened foods/beverages and diet  drinks. Lastly, wear SPF 50 sunscreen on exposed skin and when in direct sunlight for an extended period of time.  Be sure to avoid fast food restaurants and aim for at least 60 ounces of water daily.      Orders: -     CBC -     CMP14+EGFR -     Lipid panel -     TSH  Moderate persistent asthma without complication Assessment & Plan: Chronic, currently sx are stable.  She is encouraged to avoid known triggers.  She now has nebulizer to use prn.  She will continue with Symbicort 2 puffs twice daily and montelukast 10mg  daily.    Orders: -     Albuterol Sulfate HFA; Inhale 2 puffs into the lungs every 6 (six) hours as needed for wheezing or shortness of breath.  Dispense: 3 each; Refill: 3 -     Budesonide-Formoterol Fumarate; Inhale 2 puffs into the lungs 2 (two) times daily.  Dispense: 3 each; Refill: 3  Pre-diabetes Assessment & Plan: Previous labs reviewed, her A1c has been elevated in the past. I will check an A1c today. Reminded to avoid refined sugars including sugary drinks/foods and processed meats including bacon, sausages and deli meats.    Orders: -     Hemoglobin A1c  Iron deficiency anemia due to chronic blood loss Assessment & Plan: She does report menorrhagia. I will check CBC/iron levels today. I will supplement as indicated.    Orders: -     Iron, TIBC and Ferritin Panel  Class 3 severe obesity due to excess calories with serious comorbidity and body mass index (BMI) of 40.0 to 44.9 in adult [facility]) Assessment & Plan: BMI 40.  She is encouraged to initially strive for BMI less than 35 to decrease cardiac risk. Advised  to aim for at least 150 minutes of exercise per week.    She is encouraged to strive for BMI less than 30 to decrease cardiac risk. Advised to aim for at least 150 minutes of exercise per week.    Return for 1 year hm, 4 month pre dm f/u. Marland Kitchen Patient was given opportunity to ask questions. Patient verbalized understanding of the plan and was able  to repeat key elements of the plan. All questions were answered to their satisfaction.   I, [name], MD, have reviewed all documentation for this visit. The documentation on 07/05/23 for the exam, diagnosis, procedures, and orders are all accurate and complete.

## 2023-07-05 NOTE — Patient Instructions (Addendum)
 Increase intake cruciferous veggies Broccoli, cauliflower, cabbage, brussel sprouts, kale Decrease intake of sugar and processed meats - bacon, sausages, and deli meats  Health Maintenance, Female Adopting a healthy lifestyle and getting preventive care are important in promoting health and wellness. Ask your health care provider about: The right schedule for you to have regular tests and exams. Things you can do on your own to prevent diseases and keep yourself healthy. What should I know about diet, weight, and exercise? Eat a healthy diet  Eat a diet that includes plenty of vegetables, fruits, low-fat dairy products, and lean protein. Do not eat a lot of foods that are high in solid fats, added sugars, or sodium. Maintain a healthy weight Body mass index (BMI) is used to identify weight problems. It estimates body fat based on height and weight. Your health care provider can help determine your BMI and help you achieve or maintain a healthy weight. Get regular exercise Get regular exercise. This is one of the most important things you can do for your health. Most adults should: Exercise for at least 150 minutes each week. The exercise should increase your heart rate and make you sweat (moderate-intensity exercise). Do strengthening exercises at least twice a week. This is in addition to the moderate-intensity exercise. Spend less time sitting. Even light physical activity can be beneficial. Watch cholesterol and blood lipids Have your blood tested for lipids and cholesterol at 29 years of age, then have this test every 5 years. Have your cholesterol levels checked more often if: Your lipid or cholesterol levels are high. You are older than 29 years of age. You are at high risk for heart disease. What should I know about cancer screening? Depending on your health history and family history, you may need to have cancer screening at various ages. This may include screening for: Breast  cancer. Cervical cancer. Colorectal cancer. Skin cancer. Lung cancer. What should I know about heart disease, diabetes, and high blood pressure? Blood pressure and heart disease High blood pressure causes heart disease and increases the risk of stroke. This is more likely to develop in people who have high blood pressure readings or are overweight. Have your blood pressure checked: Every 3-5 years if you are 49-66 years of age. Every year if you are 34 years old or older. Diabetes Have regular diabetes screenings. This checks your fasting blood sugar level. Have the screening done: Once every three years after age 45 if you are at a normal weight and have a low risk for diabetes. More often and at a younger age if you are overweight or have a high risk for diabetes. What should I know about preventing infection? Hepatitis B If you have a higher risk for hepatitis B, you should be screened for this virus. Talk with your health care provider to find out if you are at risk for hepatitis B infection. Hepatitis C Testing is recommended for: Everyone born from [birth year] through 1966. Anyone with known risk factors for hepatitis C. Sexually transmitted infections (STIs) Get screened for STIs, including gonorrhea and chlamydia, if: You are sexually active and are younger than 29 years of age. You are older than 29 years of age and your health care provider tells you that you are at risk for this type of infection. Your sexual activity has changed since you were last screened, and you are at increased risk for chlamydia or gonorrhea. Ask your health care provider if you are at risk. Ask your health  care provider about whether you are at high risk for HIV. Your health care provider may recommend a prescription medicine to help prevent HIV infection. If you choose to take medicine to prevent HIV, you should first get tested for HIV. You should then be tested every 3 months for as long as you are taking  the medicine. Pregnancy If you are about to stop having your period (premenopausal) and you may become pregnant, seek counseling before you get pregnant. Take 400 to 800 micrograms (mcg) of folic acid every day if you become pregnant. Ask for birth control (contraception) if you want to prevent pregnancy. Osteoporosis and menopause Osteoporosis is a disease in which the bones lose minerals and strength with aging. This can result in bone fractures. If you are 68 years old or older, or if you are at risk for osteoporosis and fractures, ask your health care provider if you should: Be screened for bone loss. Take a calcium or vitamin D  supplement to lower your risk of fractures. Be given hormone replacement therapy (HRT) to treat symptoms of menopause. Follow these instructions at home: Alcohol use Do not drink alcohol if: Your health care provider tells you not to drink. You are pregnant, may be pregnant, or are planning to become pregnant. If you drink alcohol: Limit how much you have to: 0-1 drink a day. Know how much alcohol is in your drink. In the [place] one drink equals one 12 oz bottle of beer (355 mL), one 5 oz glass of wine (148 mL), or one 1 oz glass of hard liquor (44 mL). Lifestyle Do not use any products that contain nicotine or tobacco. These products include cigarettes, chewing tobacco, and vaping devices, such as e-cigarettes. If you need help quitting, ask your health care provider. Do not use street drugs. Do not share needles. Ask your health care provider for help if you need support or information about quitting drugs. General instructions Schedule regular health, dental, and eye exams. Stay current with your vaccines. Tell your health care provider if: You often feel depressed. You have ever been abused or do not feel safe at home. Summary Adopting a healthy lifestyle and getting preventive care are important in promoting health and wellness. Follow your health  care provider's instructions about healthy diet, exercising, and getting tested or screened for diseases. Follow your health care provider's instructions on monitoring your cholesterol and blood pressure. This information is not intended to replace advice given to you by your health care provider. Make sure you discuss any questions you have with your health care provider. Document Revised: 10/05/2020 Document Reviewed: 10/05/2020 Elsevier Patient Education  2024 [employer].

## 2023-07-06 LAB — CMP14+EGFR
ALT: 9 [IU]/L (ref 0–32)
AST: 12 [IU]/L (ref 0–40)
Albumin: 4.3 g/dL (ref 4.0–5.0)
Alkaline Phosphatase: 58 [IU]/L (ref 44–121)
BUN/Creatinine Ratio: 21 (ref 9–23)
BUN: 16 mg/dL (ref 6–20)
Bilirubin Total: 0.2 mg/dL (ref 0.0–1.2)
CO2: 22 mmol/L (ref 20–29)
Calcium: 9.5 mg/dL (ref 8.7–10.2)
Chloride: 105 mmol/L (ref 96–106)
Creatinine, Ser: 0.78 mg/dL (ref 0.57–1.00)
Globulin, Total: 2.5 g/dL (ref 1.5–4.5)
Glucose: 82 mg/dL (ref 70–99)
Potassium: 4.1 mmol/L (ref 3.5–5.2)
Sodium: 142 mmol/L (ref 134–144)
Total Protein: 6.8 g/dL (ref 6.0–8.5)
eGFR: 106 mL/min/{1.73_m2} (ref >59)

## 2023-07-06 LAB — CBC
Hematocrit: 36.5 % (ref 34.0–46.6)
Hemoglobin: 10.8 g/dL — ABNORMAL LOW (ref 11.1–15.9)
MCH: 21.8 pg — ABNORMAL LOW (ref 26.6–33.0)
MCHC: 29.6 g/dL — ABNORMAL LOW (ref 31.5–35.7)
MCV: 74 fL — ABNORMAL LOW (ref 79–97)
Platelets: 405 10*3/uL (ref 150–450)
RBC: 4.96 x10E6/uL (ref 3.77–5.28)
RDW: 17.2 % — ABNORMAL HIGH (ref 11.7–15.4)
WBC: 7.3 10*3/uL (ref 3.4–10.8)

## 2023-07-06 LAB — LIPID PANEL
Chol/HDL Ratio: 2.5 {ratio} (ref 0.0–4.4)
Cholesterol, Total: 156 mg/dL (ref 100–199)
HDL: 62 mg/dL (ref >39)
LDL Chol Calc (NIH): 83 mg/dL (ref 0–99)
Triglycerides: 55 mg/dL (ref 0–149)
VLDL Cholesterol Cal: 11 mg/dL (ref 5–40)

## 2023-07-06 LAB — IRON,TIBC AND FERRITIN PANEL
Ferritin: 11 ng/mL — ABNORMAL LOW (ref 15–150)
Iron Saturation: 6 % — CL (ref 15–55)
Iron: 25 ug/dL — ABNORMAL LOW (ref 27–159)
Total Iron Binding Capacity: 394 ug/dL (ref 250–450)
UIBC: 369 ug/dL (ref 131–425)

## 2023-07-06 LAB — HEMOGLOBIN A1C
Est. average glucose Bld gHb Est-mCnc: 117 mg/dL
Hgb A1c MFr Bld: 5.7 % — ABNORMAL HIGH (ref 4.8–5.6)

## 2023-07-06 LAB — TSH: TSH: 0.769 u[IU]/mL (ref 0.450–4.500)

## 2023-07-15 DIAGNOSIS — Z0000 Encounter for general adult medical examination without abnormal findings: Secondary | ICD-10-CM | POA: Insufficient documentation

## 2023-07-15 DIAGNOSIS — R7303 Prediabetes: Secondary | ICD-10-CM | POA: Insufficient documentation

## 2023-07-15 MED ORDER — BUDESONIDE-FORMOTEROL FUMARATE 80-4.5 MCG/ACT IN AERO
2.0000 | INHALATION_SPRAY | Freq: Two times a day (BID) | RESPIRATORY_TRACT | 3 refills | Status: AC
Start: 2023-07-15 — End: ?

## 2023-07-15 NOTE — Assessment & Plan Note (Signed)
 Previous labs reviewed, her A1c has been elevated in the past. I will check an A1c today. Reminded to avoid refined sugars including sugary drinks/foods and processed meats including bacon, sausages and deli meats.

## 2023-07-15 NOTE — Assessment & Plan Note (Signed)
 Chronic, currently sx are stable.  She is encouraged to avoid known triggers.  She now has nebulizer to use prn.  She will continue with Symbicort 2 puffs twice daily and montelukast 10mg  daily.

## 2023-07-15 NOTE — Assessment & Plan Note (Signed)

## 2023-07-15 NOTE — Assessment & Plan Note (Signed)
BMI 40.  She is encouraged to initially strive for BMI less than 35 to decrease cardiac risk. Advised to aim for at least 150 minutes of exercise per week.

## 2023-07-15 NOTE — Assessment & Plan Note (Signed)
 She does report menorrhagia. I will check CBC/iron levels today. I will supplement as indicated.

## 2023-08-11 ENCOUNTER — Encounter (HOSPITAL_BASED_OUTPATIENT_CLINIC_OR_DEPARTMENT_OTHER): Payer: Self-pay

## 2023-08-11 ENCOUNTER — Emergency Department (HOSPITAL_BASED_OUTPATIENT_CLINIC_OR_DEPARTMENT_OTHER)
Admission: EM | Admit: 2023-08-11 | Discharge: 2023-08-12 | Disposition: A | Discharge status: Home / Self Care | Attending: Emergency Medicine | Admitting: Emergency Medicine

## 2023-08-11 ENCOUNTER — Emergency Department (HOSPITAL_BASED_OUTPATIENT_CLINIC_OR_DEPARTMENT_OTHER)

## 2023-08-11 DIAGNOSIS — N72 Inflammatory disease of cervix uteri: Secondary | ICD-10-CM

## 2023-08-11 DIAGNOSIS — R112 Nausea with vomiting, unspecified: Secondary | ICD-10-CM | POA: Insufficient documentation

## 2023-08-11 DIAGNOSIS — R1084 Generalized abdominal pain: Secondary | ICD-10-CM | POA: Insufficient documentation

## 2023-08-11 DIAGNOSIS — R109 Unspecified abdominal pain: Secondary | ICD-10-CM | POA: Diagnosis present

## 2023-08-11 DIAGNOSIS — M7918 Myalgia, other site: Secondary | ICD-10-CM | POA: Diagnosis not present

## 2023-08-11 DIAGNOSIS — R509 Fever, unspecified: Secondary | ICD-10-CM | POA: Diagnosis not present

## 2023-08-11 DIAGNOSIS — R5383 Other fatigue: Secondary | ICD-10-CM | POA: Insufficient documentation

## 2023-08-11 LAB — COMPREHENSIVE METABOLIC PANEL
ALT: 10 U/L (ref 0–44)
AST: 11 U/L — ABNORMAL LOW (ref 15–41)
Albumin: 4.5 g/dL (ref 3.5–5.0)
Alkaline Phosphatase: 51 U/L (ref 38–126)
Anion gap: 9 (ref 5–15)
BUN: 16 mg/dL (ref 6–20)
CO2: 24 mmol/L (ref 22–32)
Calcium: 10 mg/dL (ref 8.9–10.3)
Chloride: 102 mmol/L (ref 98–111)
Creatinine, Ser: 0.71 mg/dL (ref 0.44–1.00)
GFR, Estimated: >60 mL/min (ref >60)
Glucose, Bld: 110 mg/dL — ABNORMAL HIGH (ref 70–99)
Potassium: 3.5 mmol/L (ref 3.5–5.1)
Sodium: 135 mmol/L (ref 135–145)
Total Bilirubin: 0.4 mg/dL (ref 0.0–1.2)
Total Protein: 7.6 g/dL (ref 6.5–8.1)

## 2023-08-11 LAB — CBC WITH DIFFERENTIAL/PLATELET
Abs Immature Granulocytes: 0.12 10*3/uL — ABNORMAL HIGH (ref 0.00–0.07)
Basophils Absolute: 0 10*3/uL (ref 0.0–0.1)
Basophils Relative: 0 %
Eosinophils Absolute: 0.1 10*3/uL (ref 0.0–0.5)
Eosinophils Relative: 1 %
HCT: 36.7 % (ref 36.0–46.0)
Hemoglobin: 11.4 g/dL — ABNORMAL LOW (ref 12.0–15.0)
Immature Granulocytes: 1 %
Lymphocytes Relative: 6 %
Lymphs Abs: 0.9 10*3/uL (ref 0.7–4.0)
MCH: 22.4 pg — ABNORMAL LOW (ref 26.0–34.0)
MCHC: 31.1 g/dL (ref 30.0–36.0)
MCV: 72 fL — ABNORMAL LOW (ref 80.0–100.0)
Monocytes Absolute: 0.7 10*3/uL (ref 0.1–1.0)
Monocytes Relative: 5 %
Neutro Abs: 13.3 10*3/uL — ABNORMAL HIGH (ref 1.7–7.7)
Neutrophils Relative %: 87 %
Platelets: 359 10*3/uL (ref 150–400)
RBC: 5.1 MIL/uL (ref 3.87–5.11)
RDW: 18.6 % — ABNORMAL HIGH (ref 11.5–15.5)
WBC: 15.1 10*3/uL — ABNORMAL HIGH (ref 4.0–10.5)
nRBC: 0 % (ref 0.0–0.2)

## 2023-08-11 LAB — RESP PANEL BY RT-PCR (RSV, FLU A&B, COVID)  RVPGX2
Influenza A by PCR: NEGATIVE
Influenza B by PCR: NEGATIVE
Resp Syncytial Virus by PCR: NEGATIVE
SARS Coronavirus 2 by RT PCR: NEGATIVE

## 2023-08-11 LAB — HCG, SERUM, QUALITATIVE: Preg, Serum: NEGATIVE

## 2023-08-11 MED ORDER — ONDANSETRON 4 MG PO TBDP
4.0000 mg | ORAL_TABLET | Freq: Once | ORAL | Status: AC
  Administered 2023-08-11: 4 mg via ORAL
  Filled 2023-08-11: qty 1

## 2023-08-11 MED ORDER — KETOROLAC TROMETHAMINE 30 MG/ML IJ SOLN
15.0000 mg | Freq: Once | INTRAMUSCULAR | Status: AC
  Administered 2023-08-11: 15 mg via INTRAVENOUS
  Filled 2023-08-11: qty 1

## 2023-08-11 MED ORDER — IOHEXOL 300 MG/ML  SOLN
100.0000 mL | Freq: Once | INTRAMUSCULAR | Status: AC | PRN
  Administered 2023-08-12: 100 mL via INTRAVENOUS

## 2023-08-11 MED ORDER — ONDANSETRON HCL 4 MG/2ML IJ SOLN
4.0000 mg | Freq: Once | INTRAMUSCULAR | Status: AC
  Administered 2023-08-11: 4 mg via INTRAVENOUS
  Filled 2023-08-11: qty 2

## 2023-08-11 NOTE — ED Triage Notes (Signed)
 Pt c/o sudden onset abd pain vomiting, dizziness, bodyaches & fever "for a couple days, got worse today."   Attempted tylenol approx 5p "but it didn't stay down."

## 2023-08-11 NOTE — ED Provider Notes (Incomplete)
 [place] EMERGENCY DEPARTMENT AT [facility] Provider Note   CSN: [id number] Arrival date & time: 08/11/23  2137     History {Add pertinent medical, surgical, social history, OB history to HPI:1} Chief Complaint  Patient presents with  . Abdominal Pain  . Emesis  . Nausea    Rebecca Nicholson is a 28 y.o. female presents today for sudden onset of abdominal pain.  Patient states that she has been having nausea, vomiting, fatigue, body aches, and fever for a couple days.  Patient states that her symptoms got worse today.  Patient denies cough, congestion, shortness of breath, chest pain, or diarrhea.   Abdominal Pain Associated symptoms: fatigue, nausea and vomiting   Emesis Associated symptoms: abdominal pain        Home Medications Prior to Admission medications   Medication Sig Start Date End Date Taking? Authorizing Provider  albuterol (PROVENTIL) (2.5 MG/3ML) 0.083% nebulizer solution Take 3 mLs (2.5 mg total) by nebulization every 4 (four) hours as needed for wheezing or shortness of breath. 04/25/23 04/24/24  Dorothyann Peng, MD  albuterol (VENTOLIN HFA) 108 (90 Base) MCG/ACT inhaler Inhale 2 puffs into the lungs every 6 (six) hours as needed for wheezing or shortness of breath. 07/05/23   Dorothyann Peng, MD  Blood Pressure Monitoring (BLOOD PRESSURE KIT) DEVI 1 Device by Does not apply route once a week. 08/31/22   Hermina Staggers, MD  budesonide-formoterol (SYMBICORT) 80-4.5 MCG/ACT inhaler Inhale 2 puffs into the lungs 2 (two) times daily. 07/15/23   Dorothyann Peng, MD  fluconazole (DIFLUCAN) 150 MG tablet Take one tab po today, repeat in 48 hours 04/25/23   Dorothyann Peng, MD  metFORMIN (GLUCOPHAGE) 500 MG tablet Take 1 tablet (500 mg total) by mouth 2 (two) times daily with a meal. 07/05/22   Arnette Felts, FNP  montelukast (SINGULAIR) 10 MG tablet Take 1 tablet (10 mg total) by mouth daily. 06/27/22 06/27/23  Arnette Felts, FNP  mupirocin ointment (BACTROBAN) 2 %  Apply 1 Application topically 2 (two) times daily. Patient not taking: Reported on 04/23/2023 09/24/22   Valinda Hoar, NP  Vitamin D, Ergocalciferol, (DRISDOL) 1.25 MG (50000 UNIT) CAPS capsule Take 1 capsule (50,000 Units total) by mouth 2 (two) times a week. Patient not taking: Reported on 04/23/2023 07/04/22   Arnette Felts, FNP  hyoscyamine (LEVSIN SL) 0.125 MG SL tablet Place 1 tablet (0.125 mg total) under the tongue 2 (two) times daily. 06/11/18 03/05/19  Napoleon Form, MD  medroxyPROGESTERone (DEPO-PROVERA) 150 MG/ML injection Inject 150 mg into the muscle every 3 (three) months.  03/05/19  [provider]      Allergies    Cephalosporins    Review of Systems   Review of Systems  Constitutional:  Positive for fatigue.  Gastrointestinal:  Positive for abdominal pain, nausea and vomiting.    Physical Exam Updated Vital Signs BP 125/78 (BP Location: Right Arm)   Pulse (!) 108   Temp (!) 101.6 F (38.7 C) (Oral)   Resp 20   SpO2 100%  Physical Exam Vitals and nursing note reviewed.  Constitutional:      General: She is not in acute distress.    Appearance: She is well-developed. She is obese. She is ill-appearing. She is not toxic-appearing or diaphoretic.  HENT:     Head: Normocephalic and atraumatic.     Mouth/Throat:     Mouth: Mucous membranes are moist.  Eyes:     Extraocular Movements: Extraocular movements intact.  Conjunctiva/sclera: Conjunctivae normal.  Cardiovascular:     Rate and Rhythm: Normal rate and regular rhythm.     Heart sounds: Normal heart sounds. No murmur heard. Pulmonary:     Effort: Pulmonary effort is normal. No respiratory distress.     Breath sounds: Normal breath sounds.  Abdominal:     General: Bowel sounds are normal.     Palpations: Abdomen is soft.     Tenderness: There is generalized abdominal tenderness.  Musculoskeletal:        General: No swelling.     Cervical back: Neck supple.  Skin:    General: Skin is  warm and dry.     Capillary Refill: Capillary refill takes less than 2 seconds.  Neurological:     General: No focal deficit present.     Mental Status: She is alert.  Psychiatric:        Mood and Affect: Mood normal.     ED Results / Procedures / Treatments   Labs (all labs ordered are listed, but only abnormal results are displayed) Labs Reviewed  COMPREHENSIVE METABOLIC PANEL - Abnormal; Notable for the following components:      Result Value   Glucose, Bld 110 (*)    AST 11 (*)    All other components within normal limits  CBC WITH DIFFERENTIAL/PLATELET - Abnormal; Notable for the following components:   WBC 15.1 (*)    Hemoglobin 11.4 (*)    MCV 72.0 (*)    MCH 22.4 (*)    RDW 18.6 (*)    Neutro Abs 13.3 (*)    Abs Immature Granulocytes 0.12 (*)    All other components within normal limits  RESP PANEL BY RT-PCR (RSV, FLU A&B, COVID)  RVPGX2  HCG, SERUM, QUALITATIVE    EKG None  Radiology No results found.  Procedures Procedures  {Document cardiac monitor, telemetry assessment procedure when appropriate:1}  Medications Ordered in ED Medications  ondansetron (ZOFRAN-ODT) disintegrating tablet 4 mg (4 mg Oral Given 08/11/23 2145)  ondansetron (ZOFRAN) injection 4 mg (4 mg Intravenous Given 08/11/23 2301)  ketorolac (TORADOL) 30 MG/ML injection 15 mg (15 mg Intravenous Given 08/11/23 2302)    ED Course/ Medical Decision Making/ A&P   {   Click here for ABCD2, HEART and other calculatorsREFRESH Note before signing :1}                              Medical Decision Making Amount and/or Complexity of Data Reviewed Labs: ordered. Radiology: ordered.  Risk Prescription drug management.   This patient presents to the ED with chief complaint(s) of abdominal pain with pertinent past medical history of none which further complicates the presenting complaint. The complaint involves an extensive differential diagnosis and also carries with it a high risk of  complications and morbidity.    The differential diagnosis includes appendicitis  Additional history obtained: Records reviewed Care Everywhere/External Records  ED Course and Reassessment: Patient given Zofran for nausea and Toradol for pain and fever  Independent labs interpretation:  The following labs were independently interpreted:  CBC: Leukocytosis at 15.1 and anemia at 11.4 which is chronic per historical values CMP: Decreased AST at 11 Serum hCG: Respiratory panel: Negative  Independent visualization of imaging: - I independently visualized the following imaging with scope of interpretation limited to determining acute life threatening conditions related to emergency care: CT abdomen pelvis with contrast, which revealed Pending  Patient signed out to Dr.  Polina at shift change pending CT abdomen and pelvis which will likely determine patient's disposition.   {Document critical care time when appropriate:1} {Document review of labs and clinical decision tools ie heart score, Chads2Vasc2 etc:1}  {Document your independent review of radiology images, and any outside records:1} {Document your discussion with family members, caretakers, and with consultants:1} {Document social determinants of health affecting pt's care:1} {Document your decision making why or why not admission, treatments were needed:1} Final Clinical Impression(s) / ED Diagnoses Final diagnoses:  None    Rx / DC Orders ED Discharge Orders     None

## 2023-08-11 NOTE — ED Provider Notes (Signed)
 [place] EMERGENCY DEPARTMENT AT [facility] Provider Note   CSN: [id number] Arrival date & time: 08/11/23  2137     History  Chief Complaint  Patient presents with   Abdominal Pain   Emesis   Nausea    Rebecca Nicholson is a 28 y.o. female presents today for sudden onset of abdominal pain.  Patient states that she has been having nausea, vomiting, fatigue, body aches, and fever for a couple days.  Patient states that her symptoms got worse today.  Patient denies cough, congestion, shortness of breath, chest pain, or diarrhea.   Abdominal Pain Associated symptoms: fatigue, nausea and vomiting   Emesis Associated symptoms: abdominal pain        Home Medications Prior to Admission medications   Medication Sig Start Date End Date Taking? Authorizing Provider  albuterol (PROVENTIL) (2.5 MG/3ML) 0.083% nebulizer solution Take 3 mLs (2.5 mg total) by nebulization every 4 (four) hours as needed for wheezing or shortness of breath. 04/25/23 04/24/24  Dorothyann Peng, MD  albuterol (VENTOLIN HFA) 108 (90 Base) MCG/ACT inhaler Inhale 2 puffs into the lungs every 6 (six) hours as needed for wheezing or shortness of breath. 07/05/23   Dorothyann Peng, MD  Blood Pressure Monitoring (BLOOD PRESSURE KIT) DEVI 1 Device by Does not apply route once a week. 08/31/22   Hermina Staggers, MD  budesonide-formoterol (SYMBICORT) 80-4.5 MCG/ACT inhaler Inhale 2 puffs into the lungs 2 (two) times daily. 07/15/23   Dorothyann Peng, MD  fluconazole (DIFLUCAN) 150 MG tablet Take one tab po today, repeat in 48 hours 04/25/23   Dorothyann Peng, MD  metFORMIN (GLUCOPHAGE) 500 MG tablet Take 1 tablet (500 mg total) by mouth 2 (two) times daily with a meal. 07/05/22   Arnette Felts, FNP  montelukast (SINGULAIR) 10 MG tablet Take 1 tablet (10 mg total) by mouth daily. 06/27/22 06/27/23  Arnette Felts, FNP  mupirocin ointment (BACTROBAN) 2 % Apply 1 Application topically 2 (two) times daily. Patient not taking:  Reported on 04/23/2023 09/24/22   Valinda Hoar, NP  Vitamin D, Ergocalciferol, (DRISDOL) 1.25 MG (50000 UNIT) CAPS capsule Take 1 capsule (50,000 Units total) by mouth 2 (two) times a week. Patient not taking: Reported on 04/23/2023 07/04/22   Arnette Felts, FNP  hyoscyamine (LEVSIN SL) 0.125 MG SL tablet Place 1 tablet (0.125 mg total) under the tongue 2 (two) times daily. 06/11/18 03/05/19  Napoleon Form, MD  medroxyPROGESTERone (DEPO-PROVERA) 150 MG/ML injection Inject 150 mg into the muscle every 3 (three) months.  03/05/19  [provider]      Allergies    Cephalosporins    Review of Systems   Review of Systems  Constitutional:  Positive for fatigue.  Gastrointestinal:  Positive for abdominal pain, nausea and vomiting.    Physical Exam Updated Vital Signs BP 125/78 (BP Location: Right Arm)   Pulse (!) 108   Temp (!) 101.6 F (38.7 C) (Oral)   Resp 20   SpO2 100%  Physical Exam Vitals and nursing note reviewed.  Constitutional:      General: She is not in acute distress.    Appearance: She is well-developed. She is obese. She is ill-appearing. She is not toxic-appearing or diaphoretic.  HENT:     Head: Normocephalic and atraumatic.     Mouth/Throat:     Mouth: Mucous membranes are moist.  Eyes:     Extraocular Movements: Extraocular movements intact.     Conjunctiva/sclera: Conjunctivae normal.  Cardiovascular:  Rate and Rhythm: Normal rate and regular rhythm.     Heart sounds: Normal heart sounds. No murmur heard. Pulmonary:     Effort: Pulmonary effort is normal. No respiratory distress.     Breath sounds: Normal breath sounds.  Abdominal:     General: Bowel sounds are normal.     Palpations: Abdomen is soft.     Tenderness: There is generalized abdominal tenderness.  Musculoskeletal:        General: No swelling.     Cervical back: Neck supple.  Skin:    General: Skin is warm and dry.     Capillary Refill: Capillary refill takes less than 2  seconds.  Neurological:     General: No focal deficit present.     Mental Status: She is alert.  Psychiatric:        Mood and Affect: Mood normal.     ED Results / Procedures / Treatments   Labs (all labs ordered are listed, but only abnormal results are displayed) Labs Reviewed  COMPREHENSIVE METABOLIC PANEL - Abnormal; Notable for the following components:      Result Value   Glucose, Bld 110 (*)    AST 11 (*)    All other components within normal limits  CBC WITH DIFFERENTIAL/PLATELET - Abnormal; Notable for the following components:   WBC 15.1 (*)    Hemoglobin 11.4 (*)    MCV 72.0 (*)    MCH 22.4 (*)    RDW 18.6 (*)    Neutro Abs 13.3 (*)    Abs Immature Granulocytes 0.12 (*)    All other components within normal limits  RESP PANEL BY RT-PCR (RSV, FLU A&B, COVID)  RVPGX2  HCG, SERUM, QUALITATIVE    EKG None  Radiology No results found.  Procedures Procedures    Medications Ordered in ED Medications  ondansetron (ZOFRAN-ODT) disintegrating tablet 4 mg (4 mg Oral Given 08/11/23 2145)  ondansetron (ZOFRAN) injection 4 mg (4 mg Intravenous Given 08/11/23 2301)  ketorolac (TORADOL) 30 MG/ML injection 15 mg (15 mg Intravenous Given 08/11/23 2302)    ED Course/ Medical Decision Making/ A&P                                 Medical Decision Making Amount and/or Complexity of Data Reviewed Labs: ordered. Radiology: ordered.  Risk Prescription drug management.   This patient presents to the ED with chief complaint(s) of abdominal pain with pertinent past medical history of none which further complicates the presenting complaint. The complaint involves an extensive differential diagnosis and also carries with it a high risk of complications and morbidity.    The differential diagnosis includes appendicitis  Additional history obtained: Records reviewed Care Everywhere/External Records  ED Course and Reassessment: Patient given Zofran for nausea and Toradol  for pain and fever  Independent labs interpretation:  The following labs were independently interpreted:  CBC: Leukocytosis at 15.1 and anemia at 11.4 which is chronic per historical values CMP: Decreased AST at 11 Serum hCG: Respiratory panel: Negative  Independent visualization of imaging: - I independently visualized the following imaging with scope of interpretation limited to determining acute life threatening conditions related to emergency care: CT abdomen pelvis with contrast, which revealed Pending  Patient signed out to Dr. Oletta Cohn at shift change pending CT abdomen and pelvis which will likely determine patient's disposition.         Final Clinical Impression(s) / ED Diagnoses Final  diagnoses:  None    Rx / DC Orders ED Discharge Orders     None         Gretta Began 08/12/23 0005    Gilda Crease, MD 08/12/23 0301    Gilda Crease, MD 08/12/23 [id number]

## 2023-08-12 LAB — WET PREP, GENITAL
Sperm: NONE SEEN
Trich, Wet Prep: NONE SEEN
WBC, Wet Prep HPF POC: <10 (ref <10)
Yeast Wet Prep HPF POC: NONE SEEN

## 2023-08-12 LAB — HIV ANTIBODY (ROUTINE TESTING W REFLEX): HIV Screen 4th Generation wRfx: NONREACTIVE

## 2023-08-12 MED ORDER — GENTAMICIN SULFATE 40 MG/ML IJ SOLN
240.0000 mg | Freq: Once | INTRAMUSCULAR | Status: AC
  Administered 2023-08-12: 240 mg via INTRAMUSCULAR
  Filled 2023-08-12: qty 6

## 2023-08-12 MED ORDER — ACETAMINOPHEN 500 MG PO TABS
1000.0000 mg | ORAL_TABLET | Freq: Once | ORAL | Status: AC
Start: 2023-08-12 — End: 2023-08-12
  Administered 2023-08-12: 1000 mg via ORAL
  Filled 2023-08-12: qty 2

## 2023-08-12 MED ORDER — ONDANSETRON 4 MG PO TBDP: ORAL_TABLET | ORAL | 0 refills | Status: AC

## 2023-08-12 MED ORDER — AZITHROMYCIN 1 G PO PACK
2.0000 g | PACK | Freq: Once | ORAL | Status: AC
  Administered 2023-08-12: 2 g via ORAL
  Filled 2023-08-12: qty 2

## 2023-08-12 MED ORDER — DOXYCYCLINE MONOHYDRATE 25 MG/5ML PO SUSR: 100.0000 mg | Freq: Two times a day (BID) | ORAL | 0 refills | Status: AC

## 2023-08-14 LAB — GC/CHLAMYDIA PROBE AMP (~~LOC~~) NOT AT ARMC
Chlamydia: POSITIVE — AB
Comment: NEGATIVE
Comment: NORMAL
Neisseria Gonorrhea: NEGATIVE

## 2023-08-24 ENCOUNTER — Ambulatory Visit: Admitting: Internal Medicine

## 2023-10-18 LAB — HM PAP SMEAR: Chlamydia, Swab/Urine, PCR: NEGATIVE

## 2023-10-30 ENCOUNTER — Encounter: Payer: Self-pay | Admitting: Internal Medicine

## 2023-10-30 ENCOUNTER — Ambulatory Visit: Admitting: Internal Medicine

## 2023-10-30 VITALS — BP 124/82 | HR 78 | Temp 98.8°F | Ht 67.0 in | Wt 253.4 lb

## 2023-10-30 DIAGNOSIS — H538 Other visual disturbances: Secondary | ICD-10-CM

## 2023-10-30 DIAGNOSIS — I10 Essential (primary) hypertension: Secondary | ICD-10-CM

## 2023-10-30 DIAGNOSIS — D500 Iron deficiency anemia secondary to blood loss (chronic): Secondary | ICD-10-CM

## 2023-10-30 DIAGNOSIS — R7303 Prediabetes: Secondary | ICD-10-CM | POA: Diagnosis not present

## 2023-10-30 MED ORDER — AMLODIPINE BESYLATE 2.5 MG PO TABS
2.5000 mg | ORAL_TABLET | Freq: Every day | ORAL | 11 refills | Status: DC
Start: 2023-10-30 — End: 2023-12-12

## 2023-10-30 NOTE — Patient Instructions (Signed)
 Prediabetes: What to Know Prediabetes is when your blood sugar, also called glucose, is at a higher level than normal but not high enough for you to be diagnosed with type 2 diabetes (type 2 diabetes mellitus). Having prediabetes puts you at risk for getting type 2 diabetes. By making some healthy changes, you may be able to prevent or delay getting type 2 diabetes. This is important because type 2 diabetes can lead to serious problems. Some of these include: Heart disease. Stroke. Blindness. Kidney disease. Depression. Poor blood flow in the feet and legs. In very bad cases, this could lead to having a leg removed by surgery (amputation). What are the causes? The exact cause of prediabetes isn't known. It may result from insulin resistance. Insulin resistance happens when cells in the body don't respond properly to insulin that the body makes. This can cause too much sugar to build up in the blood. High blood sugar, also called hyperglycemia, can develop. What increases the risk? Having a family member with type 2 diabetes. Being older than 29 years of age. Having had a temporary form of diabetes during a pregnancy. This is called gestational diabetes. Having had polycystic ovary syndrome (PCOS). Being overweight or obese. Being inactive and not getting much exercise. Having a history of heart disease. This may include problems with cholesterol levels, high levels of blood fats, or high blood pressure. What are the signs or symptoms? You may have no symptoms. If you do have symptoms, they may include: Increased hunger. Increased thirst. Needing to pee more often. Changes in how you see, like blurry vision. Feeling tired. How is this diagnosed? Prediabetes can be diagnosed with blood tests that check your blood sugar. One or more of these tests may be done: A fasting blood glucose (FBG) test. You won't be allowed to eat (you will fast) for at least 8 hours before a blood sample is  taken. An A1C blood test, also called a hemoglobin A1C test. This test shows information about blood sugar levels over the past 2?3 months. An oral glucose tolerance test (OGTT). This test measures your blood sugar at two points in time: After you haven't eaten for a while. This is your baseline level. Two hours after you drink a beverage that has sugar in it. You may be diagnosed with prediabetes if: Your FBG is 100?125 mg/dL (1.6-1.0 mmol/L). Your A1C level is 5.7?6.4% (39-46 mmol/mol). Your OGTT result is 140?199 mg/dL (9.6-04 mmol/L). These blood tests may need to be done again to be sure of the diagnosis. How is this treated? Treatment may include making changes to your diet and lifestyle. These changes can help lower your blood sugar and keep you from getting type 2 diabetes. In some cases, medicine may be given to help lower your risk. Follow these instructions at home: Eating and drinking  Eat and drink as told. Follow a healthy meal plan. This includes eating lean proteins, whole grains, legumes, fresh fruits and vegetables, low-fat dairy products, and healthy fats. Meet with an expert in healthy eating called a dietitian. This person can help create a healthy eating plan that's right for you. Lifestyle Do moderate-intensity exercise. Do this for at least 30 minutes a day on 5 or more days each week, or as told by your health care provider. A mix of activities may be best. Good choices include brisk walking, swimming, biking, and weight lifting. Try to lose weight if your provider says it's OK. Losing 5-7% of your body weight can  help reverse insulin resistance. Do not drink alcohol if: Your provider tells you not to drink. You're pregnant, may be pregnant, or plan to become pregnant. If you drink alcohol: Limit how much you have to: 0-1 drink a day if you're female. 0-2 drinks a day if you're female. Know how much alcohol is in your drink. In the [place] one drink is one 12 oz  bottle of beer (355 mL), one 5 oz glass of wine (148 mL), or one 1 oz glass of hard liquor (44 mL). General instructions Take medicines only as told. You may be given medicines that help lower the risk of type 2 diabetes. Do not smoke, vape, or use nicotine or tobacco. Where to find more information American Diabetes Association: diabetes.org/about-diabetes/prediabetes Academy of Nutrition and Dietetics: eatright.org American Heart Association: Go to ThisJobs.cz. Click the search icon. Type "prediabetes" in the search box. Contact a health care provider if: You have any of these symptoms: Increased hunger. Peeing more often than usual. Increased thirst. Feeling tired. Changes in how you see, like blurry vision. Feeling like you may throw up. Throwing up. Get help right away if: You have shortness of breath. You feel confused. This information is not intended to replace advice given to you by your health care provider. Make sure you discuss any questions you have with your health care provider. Document Revised: 12/18/2022 Document Reviewed: 12/18/2022 Elsevier Patient Education  2024 [employer].

## 2023-10-30 NOTE — Progress Notes (Signed)
 I,Victoria T Basil Lim, CMA,acting as a Neurosurgeon for Smiley Dung, MD.,have documented all relevant documentation on the behalf of Smiley Dung, MD,as directed by  Smiley Dung, MD while in the presence of Smiley Dung, MD.  Subjective:  Patient ID: Rebecca Nicholson , female    DOB: February 14, 1996 , 28 y.o.   MRN: [id number]  Chief Complaint  Patient presents with   Prediabetes    Patient presents today for prediabetes follow up. She reports compliance with medications. She states her bp has been high, school doctor states it could've been related to her recent visit with her eye doctor. Denies headache, chest pain & sob.Patient had pap smear at [facility] on May 19th     HPI Discussed the use of AI scribe software for clinical note transcription with the patient, who gave verbal consent to proceed.  History of Present Illness Rebecca Nicholson is a 28 year old female with prediabetes who presents with concerns about elevated blood pressure and a recent episode of blurred vision.  She experienced a sudden episode of blurred vision in her left eye while at school. An eye doctor noted blood in her left eye and referred her to an ophthalmologist. By the time of the ophthalmologist visit, the blood was no longer visible, and her vision has remained clear since. No recent episodes of blurred vision since the initial incident.  She has been monitoring her blood pressure at home, noting several elevated readings such as 150/90, 158/86, and 158/98. However, her blood pressure was not elevated during a recent ER visit for abdominal pain. 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 She reports no issues with the blood pressure cuff feeling too tight during measurements. She denies headaches and dizziness.   She has been drinking more water and has cut out sodas, though she acknowledges she could increase her water intake further. She is not pregnant and has no  plans for pregnancy soon.  She is currently taking iron supplements but has been inconsistent with daily intake. She is working on resuming regular use of her iron supplements.  Past Medical History:  Diagnosis Date   Anemia    Asthma    as child   Chronic rhinitis 03/14/2018   Elevated LDL cholesterol level 01/29/2022   LDL 103 in March 2023. She is encouraged to limit her intake of fried foods, aim for at least 150 minutes of exercise per week and to increase fiber intake.    Hidradenitis axillaris 07/04/2022   Hidradenitis suppurativa of right axilla    Hypercholesteremia    Medical history non-contributory    Moderate persistent asthma with acute exacerbation 11/18/2019   Moderate persistent asthma without complication 03/14/2018   Pre-diabetes    Weight gain 11/18/2019     Family History  Problem Relation Age of Onset   Colon cancer Maternal Grandmother    Breast cancer Maternal Grandmother    Cancer Father    Hypertension Mother    Diabetes Mother    Diabetes Maternal Aunt    Esophageal cancer Neg Hx    Rectal cancer Neg Hx    Stomach cancer Neg Hx      Current Outpatient Medications:    albuterol  (PROVENTIL ) (2.5 MG/3ML) 0.083% nebulizer solution, Take 3 mLs (2.5 mg total) by nebulization every 4 (four) hours as needed for wheezing or shortness of breath., Disp: 75 mL, Rfl: 2   albuterol  (VENTOLIN  HFA) 108 (90 Base)  MCG/ACT inhaler, Inhale 2 puffs into the lungs every 6 (six) hours as needed for wheezing or shortness of breath., Disp: 3 each, Rfl: 3   amLODipine  (NORVASC ) 2.5 MG tablet, Take 1 tablet (2.5 mg total) by mouth daily., Disp: 30 tablet, Rfl: 11   budesonide -formoterol  (SYMBICORT ) 80-4.5 MCG/ACT inhaler, Inhale 2 puffs into the lungs 2 (two) times daily., Disp: 3 each, Rfl: 3   metFORMIN  (GLUCOPHAGE ) 500 MG tablet, Take 1 tablet (500 mg total) by mouth 2 (two) times daily with a meal., Disp: 60 tablet, Rfl: 2   Blood Pressure Monitoring (BLOOD PRESSURE KIT)  DEVI, 1 Device by Does not apply route once a week. (Patient not taking: Reported on 10/30/2023), Disp: 1 each, Rfl: 0   fluconazole  (DIFLUCAN ) 150 MG tablet, Take one tab po today, repeat in 48 hours (Patient not taking: Reported on 10/30/2023), Disp: 2 tablet, Rfl: 0   montelukast  (SINGULAIR ) 10 MG tablet, Take 1 tablet (10 mg total) by mouth daily., Disp: 90 tablet, Rfl: 1   mupirocin  ointment (BACTROBAN ) 2 %, Apply 1 Application topically 2 (two) times daily. (Patient not taking: Reported on 04/23/2023), Disp: 22 g, Rfl: 0   ondansetron  (ZOFRAN -ODT) 4 MG disintegrating tablet, 4mg  ODT q4 hours prn nausea/vomit (Patient not taking: Reported on 10/30/2023), Disp: 10 tablet, Rfl: 0   Vitamin D , Ergocalciferol , (DRISDOL ) 1.25 MG (50000 UNIT) CAPS capsule, Take 1 capsule (50,000 Units total) by mouth 2 (two) times a week. (Patient not taking: Reported on 04/23/2023), Disp: 24 capsule, Rfl: 1   Allergies  Allergen Reactions   Cephalosporins Rash     Review of Systems  Constitutional: Negative.   Respiratory: Negative.    Cardiovascular: Negative.   Gastrointestinal: Negative.   Neurological: Negative.   Psychiatric/Behavioral: Negative.       Today's Vitals   10/30/23 0903  BP: 124/82  Pulse: 78  Temp: 98.8 F (37.1 C)  SpO2: 98%  Weight: 253 lb 6.4 oz (114.9 kg)  Height: 5\' 7"  (1.702 m)   Body mass index is 39.69 kg/m.  Wt Readings from Last 3 Encounters:  10/30/23 253 lb 6.4 oz (114.9 kg)  07/05/23 255 lb 12.8 oz (116 kg)  04/25/23 238 lb 12.8 oz (108.3 kg)     BP Readings from Last 3 Encounters:  10/30/23 124/82  08/12/23 124/74  07/05/23 118/80     Objective:  Physical Exam Vitals and nursing note reviewed.  Constitutional:      Appearance: Normal appearance. She is obese.  HENT:     Head: Normocephalic and atraumatic.  Eyes:     Extraocular Movements: Extraocular movements intact.  Cardiovascular:     Rate and Rhythm: Normal rate and regular rhythm.     Heart  sounds: Normal heart sounds.  Pulmonary:     Effort: Pulmonary effort is normal.     Breath sounds: Normal breath sounds.  Musculoskeletal:     Cervical back: Normal range of motion.  Skin:    General: Skin is warm.  Neurological:     General: No focal deficit present.     Mental Status: She is alert.  Psychiatric:        Mood and Affect: Mood normal.        Behavior: Behavior normal.         Assessment And Plan:  Pre-diabetes Assessment & Plan: Prediabetes with previous blood work in March. No current hyperglycemia symptoms. - Ordered blood glucose and kidney function tests.  Orders: -     BMP8+EGFR -  Hemoglobin A1c  Essential hypertension, benign Assessment & Plan: Newly diagnosed. Multiple elevated blood pressure readings. Possible stress-related component. No symptoms of dizziness or lightheadedness. - Provided DASH eating plan information. - Prescribed low dose amlodipine  at night. - Advised dietary modifications: reduce salt intake, avoid processed meats. - Scheduled nurse visit in two weeks for blood pressure monitoring. - Instructed to report blurred vision.  Orders: -     BMP8+EGFR  Blurry vision, left eye Assessment & Plan: No recurrence of symptoms.  - I will request records from ophthalmology and optometrist   Other orders -     amLODIPine  Besylate; Take 1 tablet (2.5 mg total) by mouth daily.  Dispense: 30 tablet; Refill: 11   Return in 2 weeks (on 11/13/2023), or NV, for 6 weeks bp check with RS.  Patient was given opportunity to ask questions. Patient verbalized understanding of the plan and was able to repeat key elements of the plan. All questions were answered to their satisfaction.   I, Smiley Dung, MD, have reviewed all documentation for this visit. The documentation on 10/30/23 for the exam, diagnosis, procedures, and orders are all accurate and complete.   IF YOU HAVE BEEN REFERRED TO A SPECIALIST, IT MAY TAKE 1-2 WEEKS TO  SCHEDULE/PROCESS THE REFERRAL. IF YOU HAVE NOT HEARD FROM US /SPECIALIST IN TWO WEEKS, PLEASE GIVE US  A CALL AT [phone].   THE PATIENT IS ENCOURAGED TO PRACTICE SOCIAL DISTANCING DUE TO THE COVID-19 PANDEMIC.

## 2023-10-31 ENCOUNTER — Ambulatory Visit: Payer: Self-pay | Admitting: Internal Medicine

## 2023-10-31 LAB — BMP8+EGFR
BUN/Creatinine Ratio: 23 (ref 9–23)
BUN: 14 mg/dL (ref 6–20)
CO2: 20 mmol/L (ref 20–29)
Calcium: 9.4 mg/dL (ref 8.7–10.2)
Chloride: 103 mmol/L (ref 96–106)
Creatinine, Ser: 0.61 mg/dL (ref 0.57–1.00)
Glucose: 90 mg/dL (ref 70–99)
Potassium: 4.7 mmol/L (ref 3.5–5.2)
Sodium: 138 mmol/L (ref 134–144)
eGFR: 125 mL/min/{1.73_m2} (ref >59)

## 2023-10-31 LAB — HEMOGLOBIN A1C
Est. average glucose Bld gHb Est-mCnc: 117 mg/dL
Hgb A1c MFr Bld: 5.7 % — ABNORMAL HIGH (ref 4.8–5.6)

## 2023-11-01 ENCOUNTER — Ambulatory Visit: Payer: Self-pay | Admitting: Internal Medicine

## 2023-11-04 NOTE — Assessment & Plan Note (Signed)
 Prediabetes with previous blood work in March. No current hyperglycemia symptoms. - Ordered blood glucose and kidney function tests.

## 2023-11-04 NOTE — Assessment & Plan Note (Signed)
 No recurrence of symptoms.  - I will request records from ophthalmology and optometrist

## 2023-11-04 NOTE — Assessment & Plan Note (Signed)
 Newly diagnosed. Multiple elevated blood pressure readings. Possible stress-related component. No symptoms of dizziness or lightheadedness. - Provided DASH eating plan information. - Prescribed low dose amlodipine  at night. - Advised dietary modifications: reduce salt intake, avoid processed meats. - Scheduled nurse visit in two weeks for blood pressure monitoring. - Instructed to report blurred vision.

## 2023-11-07 ENCOUNTER — Ambulatory Visit: Payer: Self-pay | Admitting: Internal Medicine

## 2023-11-13 ENCOUNTER — Telehealth: Admitting: Family Medicine

## 2023-11-13 DIAGNOSIS — S0081XA Abrasion of other part of head, initial encounter: Secondary | ICD-10-CM

## 2023-11-13 DIAGNOSIS — R52 Pain, unspecified: Secondary | ICD-10-CM | POA: Diagnosis not present

## 2023-11-13 MED ORDER — CYCLOBENZAPRINE HCL 10 MG PO TABS
10.0000 mg | ORAL_TABLET | Freq: Three times a day (TID) | ORAL | 0 refills | Status: AC | PRN
Start: 2023-11-13 — End: ?

## 2023-11-13 MED ORDER — NAPROXEN 500 MG PO TABS
500.0000 mg | ORAL_TABLET | Freq: Two times a day (BID) | ORAL | 0 refills | Status: AC
Start: 2023-11-13 — End: ?
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
## 2023-11-13 NOTE — Patient Instructions (Signed)
 Muscle and Joint Pain: What It Means Muscle and joint pain is aches and pains in your bones, joints, muscles, and tissues that surround them. This pain can happen in any part of the body. It can last for a short time or a long time. To find the cause of the pain, you may have: A physical exam. Lab tests. Imaging tests. Follow these instructions at home: Medicines Take your medicines only as told. Treatment may include medicines for pain and inflammation. You might need to take medicines by mouth or put them on your skin. Managing pain, stiffness, and swelling     Use ice or an ice pack as told. Place a towel between your skin and the ice. Leave the ice on for 20 minutes, 2-3 times a day. Use heat as told. Use the heat source that your health care provider recommends, such as a moist heat pack or a heating pad. Do this as often as told. Place a towel between your skin and the heat source. Leave the heat on for 20-30 minutes. If your skin turns red, take off the ice or heat right away to prevent burns. The risk of burns is higher if you can't feel pain, heat, or cold. Activity When your pain is very bad, bed rest may be helpful. Lie or sit in any position that's comfortable. Get up to take short walks at least every 2 hours during the day. This helps you breathe better and keeps your blood flowing. Ask for help if you feel weak or unsteady. You may continue doing your usual activities unless the activities cause more pain. When the pain gets better, slowly increase the time and intensity of your activities or exercise. Lifestyle Learn to manage anxiety and stress. Stress increases muscle tension and can make pain worse. Ask your provider if you should try: Meditation or yoga. Therapy. Acupuncture or massage. General instructions Your provider may tell you to see a physical therapist to help you do exercises to improve movement and strength in the affected area. Exercise as  told. Contact a health care provider if: Your pain gets worse. Medicines do not help your pain. You can't use the part of your body that hurts, such as your arm, leg, or neck. You have trouble sleeping. You have trouble doing your usual activities. Get help right away if: You have a new injury and your pain is worse or different. You feel numb or you have tingling in the painful area. This information is not intended to replace advice given to you by your health care provider. Make sure you discuss any questions you have with your health care provider. Document Revised: 10/14/2022 Document Reviewed: 09/28/2022 Elsevier Patient Education  2024 [employer].

## 2023-11-14 ENCOUNTER — Ambulatory Visit

## 2023-11-21 ENCOUNTER — Ambulatory Visit

## 2023-11-21 VITALS — BP 118/76 | HR 96 | Temp 98.1°F | Ht 67.0 in | Wt 253.0 lb

## 2023-11-21 DIAGNOSIS — I10 Essential (primary) hypertension: Secondary | ICD-10-CM

## 2023-11-21 NOTE — Patient Instructions (Signed)
 Hypertension, Adult Hypertension is another name for high blood pressure. High blood pressure forces your heart to work harder to pump blood. This can cause problems over time. There are two numbers in a blood pressure reading. There is a top number (systolic) over a bottom number (diastolic). It is best to have a blood pressure that is below 120/80. What are the causes? The cause of this condition is not known. Some other conditions can lead to high blood pressure. What increases the risk? Some lifestyle factors can make you more likely to develop high blood pressure: Smoking. Not getting enough exercise or physical activity. Being overweight. Having too much fat, sugar, calories, or salt (sodium) in your diet. Drinking too much alcohol. Other risk factors include: Having any of these conditions: Heart disease. Diabetes. High cholesterol. Kidney disease. Obstructive sleep apnea. Having a family history of high blood pressure and high cholesterol. Age. The risk increases with age. Stress. What are the signs or symptoms? High blood pressure may not cause symptoms. Very high blood pressure (hypertensive crisis) may cause: Headache. Fast or uneven heartbeats (palpitations). Shortness of breath. Nosebleed. Vomiting or feeling like you may vomit (nauseous). Changes in how you see. Very bad chest pain. Feeling dizzy. Seizures. How is this treated? This condition is treated by making healthy lifestyle changes, such as: Eating healthy foods. Exercising more. Drinking less alcohol. Your doctor may prescribe medicine if lifestyle changes do not help enough and if: Your top number is above 130. Your bottom number is above 80. Your personal target blood pressure may vary. Follow these instructions at home: Eating and drinking  If told, follow the DASH eating plan. To follow this plan: Fill one half of your plate at each meal with fruits and vegetables. Fill one fourth of your plate  at each meal with whole grains. Whole grains include whole-wheat pasta, brown rice, and whole-grain bread. Eat or drink low-fat dairy products, such as skim milk or low-fat yogurt. Fill one fourth of your plate at each meal with low-fat (lean) proteins. Low-fat proteins include fish, chicken without skin, eggs, beans, and tofu. Avoid fatty meat, cured and processed meat, or chicken with skin. Avoid pre-made or processed food. Limit the amount of salt in your diet to less than 1,500 mg each day. Do not drink alcohol if: Your doctor tells you not to drink. You are pregnant, may be pregnant, or are planning to become pregnant. If you drink alcohol: Limit how much you have to: 0-1 drink a day for women. 0-2 drinks a day for men. Know how much alcohol is in your drink. In the [place] one drink equals one 12 oz bottle of beer (355 mL), one 5 oz glass of wine (148 mL), or one 1 oz glass of hard liquor (44 mL). Lifestyle  Work with your doctor to stay at a healthy weight or to lose weight. Ask your doctor what the best weight is for you. Get at least 30 minutes of exercise that causes your heart to beat faster (aerobic exercise) most days of the week. This may include walking, swimming, or biking. Get at least 30 minutes of exercise that strengthens your muscles (resistance exercise) at least 3 days a week. This may include lifting weights or doing Pilates. Do not smoke or use any products that contain nicotine or tobacco. If you need help quitting, ask your doctor. Check your blood pressure at home as told by your doctor. Keep all follow-up visits. Medicines Take over-the-counter and prescription medicines  only as told by your doctor. Follow directions carefully. Do not skip doses of blood pressure medicine. The medicine does not work as well if you skip doses. Skipping doses also puts you at risk for problems. Ask your doctor about side effects or reactions to medicines that you should watch  for. Contact a doctor if: You think you are having a reaction to the medicine you are taking. You have headaches that keep coming back. You feel dizzy. You have swelling in your ankles. You have trouble with your vision. Get help right away if: You get a very bad headache. You start to feel mixed up (confused). You feel weak or numb. You feel faint. You have very bad pain in your: Chest. Belly (abdomen). You vomit more than once. You have trouble breathing. These symptoms may be an emergency. Get help right away. Call 911. Do not wait to see if the symptoms will go away. Do not drive yourself to the hospital. Summary Hypertension is another name for high blood pressure. High blood pressure forces your heart to work harder to pump blood. For most people, a normal blood pressure is less than 120/80. Making healthy choices can help lower blood pressure. If your blood pressure does not get lower with healthy choices, you may need to take medicine. This information is not intended to replace advice given to you by your health care provider. Make sure you discuss any questions you have with your health care provider. Document Revised: 03/04/2021 Document Reviewed: 03/04/2021 Elsevier Patient Education  2024 [employer].

## 2023-11-21 NOTE — Progress Notes (Signed)
 Patient presents today for bpc. She currently takes Amlodipine  2.5mg  at night. Denies headache, chest pain & sob. She admits not drinking as much water as she should. She has recently started drinking more regularly.  BP Readings from Last 3 Encounters:  11/21/23 118/76  10/30/23 124/82  08/12/23 124/74  Per provider patient is to stay on current regimen. Along with stress exercise and cutting back on salt. Patient aware. She will follow up at future appointment.

## 2023-12-11 NOTE — Progress Notes (Unsigned)
 I,[name], CMA,acting as a Neurosurgeon for Rebecca LOISE Slocumb, MD.,have documented all relevant documentation on the behalf of Rebecca LOISE Slocumb, MD,as directed by  Rebecca LOISE Slocumb, MD while in the presence of Rebecca LOISE Slocumb, MD.  Subjective:  Patient ID: Rebecca Nicholson , female    DOB: 1996/02/18 , 28 y.o.   MRN: [id number]  No chief complaint on file.   HPI  HPI   Past Medical History:  Diagnosis Date   Anemia    Asthma    as child   Chronic rhinitis 03/14/2018   Elevated LDL cholesterol level 01/29/2022   LDL 103 in March 2023. She is encouraged to limit her intake of fried foods, aim for at least 150 minutes of exercise per week and to increase fiber intake.    Hidradenitis axillaris 07/04/2022   Hidradenitis suppurativa of right axilla    Hypercholesteremia    Medical history non-contributory    Moderate persistent asthma with acute exacerbation 11/18/2019   Moderate persistent asthma without complication 03/14/2018   Pre-diabetes    Weight gain 11/18/2019     Family History  Problem Relation Age of Onset   Colon cancer Maternal Grandmother    Breast cancer Maternal Grandmother    Cancer Father    Hypertension Mother    Diabetes Mother    Diabetes Maternal Aunt    Esophageal cancer Neg Hx    Rectal cancer Neg Hx    Stomach cancer Neg Hx      Current Outpatient Medications:    albuterol  (PROVENTIL ) (2.5 MG/3ML) 0.083% nebulizer solution, Take 3 mLs (2.5 mg total) by nebulization every 4 (four) hours as needed for wheezing or shortness of breath., Disp: 75 mL, Rfl: 2   albuterol  (VENTOLIN  HFA) 108 (90 Base) MCG/ACT inhaler, Inhale 2 puffs into the lungs every 6 (six) hours as needed for wheezing or shortness of breath., Disp: 3 each, Rfl: 3   amLODipine  (NORVASC ) 2.5 MG tablet, Take 1 tablet (2.5 mg total) by mouth daily., Disp: 30 tablet, Rfl: 11   Blood Pressure Monitoring (BLOOD PRESSURE KIT) DEVI, 1 Device by Does not apply route once a week., Disp: 1  each, Rfl: 0   budesonide -formoterol  (SYMBICORT ) 80-4.5 MCG/ACT inhaler, Inhale 2 puffs into the lungs 2 (two) times daily., Disp: 3 each, Rfl: 3   cyclobenzaprine  (FLEXERIL ) 10 MG tablet, Take 1 tablet (10 mg total) by mouth 3 (three) times daily as needed for muscle spasms., Disp: 30 tablet, Rfl: 0   fluconazole  (DIFLUCAN ) 150 MG tablet, Take one tab po today, repeat in 48 hours, Disp: 2 tablet, Rfl: 0   metFORMIN  (GLUCOPHAGE ) 500 MG tablet, Take 1 tablet (500 mg total) by mouth 2 (two) times daily with a meal., Disp: 60 tablet, Rfl: 2   montelukast  (SINGULAIR ) 10 MG tablet, Take 1 tablet (10 mg total) by mouth daily., Disp: 90 tablet, Rfl: 1   mupirocin  ointment (BACTROBAN ) 2 %, Apply 1 Application topically 2 (two) times daily., Disp: 22 g, Rfl: 0   naproxen  (NAPROSYN ) 500 MG tablet, Take 1 tablet (500 mg total) by mouth 2 (two) times daily with a meal., Disp: 30 tablet, Rfl: 0   ondansetron  (ZOFRAN -ODT) 4 MG disintegrating tablet, 4mg  ODT q4 hours prn nausea/vomit, Disp: 10 tablet, Rfl: 0   Vitamin D , Ergocalciferol , (DRISDOL ) 1.25 MG (50000 UNIT) CAPS capsule, Take 1 capsule (50,000 Units total) by mouth 2 (two) times a week., Disp: 24 capsule, Rfl: 1   Allergies  Allergen Reactions  Cephalosporins Rash     Review of Systems  Constitutional: Negative.   Respiratory: Negative.    Cardiovascular: Negative.   Neurological: Negative.   Psychiatric/Behavioral: Negative.       There were no vitals filed for this visit. There is no height or weight on file to calculate BMI.  Wt Readings from Last 3 Encounters:  11/21/23 253 lb (114.8 kg)  10/30/23 253 lb 6.4 oz (114.9 kg)  07/05/23 255 lb 12.8 oz (116 kg)     Objective:  Physical Exam      Assessment And Plan:  There are no diagnoses linked to this encounter.   No follow-ups on file.  Patient was given opportunity to ask questions. Patient verbalized understanding of the plan and was able to repeat key elements of the plan.  All questions were answered to their satisfaction.  Rebecca LOISE Slocumb, MD  I, Rebecca LOISE Slocumb, MD, have reviewed all documentation for this visit. The documentation on 12/11/23 for the exam, diagnosis, procedures, and orders are all accurate and complete.   IF YOU HAVE BEEN REFERRED TO A SPECIALIST, IT MAY TAKE 1-2 WEEKS TO SCHEDULE/PROCESS THE REFERRAL. IF YOU HAVE NOT HEARD FROM US /SPECIALIST IN TWO WEEKS, PLEASE GIVE US  A CALL AT [phone].   THE PATIENT IS ENCOURAGED TO PRACTICE SOCIAL DISTANCING DUE TO THE COVID-19 PANDEMIC.

## 2023-12-11 NOTE — Patient Instructions (Signed)
 Hypertension, Adult Hypertension is another name for high blood pressure. High blood pressure forces your heart to work harder to pump blood. This can cause problems over time. There are two numbers in a blood pressure reading. There is a top number (systolic) over a bottom number (diastolic). It is best to have a blood pressure that is below 120/80. What are the causes? The cause of this condition is not known. Some other conditions can lead to high blood pressure. What increases the risk? Some lifestyle factors can make you more likely to develop high blood pressure: Smoking. Not getting enough exercise or physical activity. Being overweight. Having too much fat, sugar, calories, or salt (sodium) in your diet. Drinking too much alcohol. Other risk factors include: Having any of these conditions: Heart disease. Diabetes. High cholesterol. Kidney disease. Obstructive sleep apnea. Having a family history of high blood pressure and high cholesterol. Age. The risk increases with age. Stress. What are the signs or symptoms? High blood pressure may not cause symptoms. Very high blood pressure (hypertensive crisis) may cause: Headache. Fast or uneven heartbeats (palpitations). Shortness of breath. Nosebleed. Vomiting or feeling like you may vomit (nauseous). Changes in how you see. Very bad chest pain. Feeling dizzy. Seizures. How is this treated? This condition is treated by making healthy lifestyle changes, such as: Eating healthy foods. Exercising more. Drinking less alcohol. Your doctor may prescribe medicine if lifestyle changes do not help enough and if: Your top number is above 130. Your bottom number is above 80. Your personal target blood pressure may vary. Follow these instructions at home: Eating and drinking  If told, follow the DASH eating plan. To follow this plan: Fill one half of your plate at each meal with fruits and vegetables. Fill one fourth of your plate  at each meal with whole grains. Whole grains include whole-wheat pasta, brown rice, and whole-grain bread. Eat or drink low-fat dairy products, such as skim milk or low-fat yogurt. Fill one fourth of your plate at each meal with low-fat (lean) proteins. Low-fat proteins include fish, chicken without skin, eggs, beans, and tofu. Avoid fatty meat, cured and processed meat, or chicken with skin. Avoid pre-made or processed food. Limit the amount of salt in your diet to less than 1,500 mg each day. Do not drink alcohol if: Your doctor tells you not to drink. You are pregnant, may be pregnant, or are planning to become pregnant. If you drink alcohol: Limit how much you have to: 0-1 drink a day for women. 0-2 drinks a day for men. Know how much alcohol is in your drink. In the [place] one drink equals one 12 oz bottle of beer (355 mL), one 5 oz glass of wine (148 mL), or one 1 oz glass of hard liquor (44 mL). Lifestyle  Work with your doctor to stay at a healthy weight or to lose weight. Ask your doctor what the best weight is for you. Get at least 30 minutes of exercise that causes your heart to beat faster (aerobic exercise) most days of the week. This may include walking, swimming, or biking. Get at least 30 minutes of exercise that strengthens your muscles (resistance exercise) at least 3 days a week. This may include lifting weights or doing Pilates. Do not smoke or use any products that contain nicotine or tobacco. If you need help quitting, ask your doctor. Check your blood pressure at home as told by your doctor. Keep all follow-up visits. Medicines Take over-the-counter and prescription medicines  only as told by your doctor. Follow directions carefully. Do not skip doses of blood pressure medicine. The medicine does not work as well if you skip doses. Skipping doses also puts you at risk for problems. Ask your doctor about side effects or reactions to medicines that you should watch  for. Contact a doctor if: You think you are having a reaction to the medicine you are taking. You have headaches that keep coming back. You feel dizzy. You have swelling in your ankles. You have trouble with your vision. Get help right away if: You get a very bad headache. You start to feel mixed up (confused). You feel weak or numb. You feel faint. You have very bad pain in your: Chest. Belly (abdomen). You vomit more than once. You have trouble breathing. These symptoms may be an emergency. Get help right away. Call 911. Do not wait to see if the symptoms will go away. Do not drive yourself to the hospital. Summary Hypertension is another name for high blood pressure. High blood pressure forces your heart to work harder to pump blood. For most people, a normal blood pressure is less than 120/80. Making healthy choices can help lower blood pressure. If your blood pressure does not get lower with healthy choices, you may need to take medicine. This information is not intended to replace advice given to you by your health care provider. Make sure you discuss any questions you have with your health care provider. Document Revised: 03/04/2021 Document Reviewed: 03/04/2021 Elsevier Patient Education  2024 [employer].

## 2023-12-12 ENCOUNTER — Ambulatory Visit: Payer: Self-pay | Admitting: Internal Medicine

## 2023-12-12 ENCOUNTER — Encounter: Payer: Self-pay | Admitting: Internal Medicine

## 2023-12-12 VITALS — BP 120/78 | HR 68 | Temp 98.9°F | Ht 67.0 in | Wt 255.0 lb

## 2023-12-12 DIAGNOSIS — J4540 Moderate persistent asthma, uncomplicated: Secondary | ICD-10-CM

## 2023-12-12 DIAGNOSIS — D500 Iron deficiency anemia secondary to blood loss (chronic): Secondary | ICD-10-CM

## 2023-12-12 DIAGNOSIS — N926 Irregular menstruation, unspecified: Secondary | ICD-10-CM | POA: Insufficient documentation

## 2023-12-12 DIAGNOSIS — I10 Essential (primary) hypertension: Secondary | ICD-10-CM

## 2023-12-12 DIAGNOSIS — R7303 Prediabetes: Secondary | ICD-10-CM

## 2023-12-12 MED ORDER — AMLODIPINE BESYLATE 2.5 MG PO TABS: 2.5000 mg | ORAL_TABLET | Freq: Every day | ORAL | 2 refills | Status: AC

## 2023-12-12 NOTE — Assessment & Plan Note (Signed)
 Chronic, recently diagnosed. She is tolerating amlodipine  2.5mg  daily without any issues. Will continue with current regimen. She will rto in 4 months for re-evaluation.  - Encouraged to follow low sodium diet - Incorporate more exercise into her daily routine.

## 2023-12-12 NOTE — Assessment & Plan Note (Signed)
 Not truly irregular, but they are heavy. She reports h/o uterine fibroids. Encouraged to f/u with GYN.

## 2023-12-12 NOTE — Assessment & Plan Note (Signed)
 Chronic, has h/o menorrhagia. Followed by GYN. Admits she is not taking any iron supplements at this time.

## 2023-12-13 ENCOUNTER — Other Ambulatory Visit: Payer: Self-pay

## 2023-12-13 ENCOUNTER — Other Ambulatory Visit: Payer: Self-pay | Admitting: Internal Medicine

## 2023-12-13 DIAGNOSIS — Z0184 Encounter for antibody response examination: Secondary | ICD-10-CM

## 2023-12-13 DIAGNOSIS — Z789 Other specified health status: Secondary | ICD-10-CM

## 2023-12-13 LAB — IRON,TIBC AND FERRITIN PANEL
Ferritin: 12 ng/mL — ABNORMAL LOW (ref 15–150)
Iron Saturation: 14 % — ABNORMAL LOW (ref 15–55)
Iron: 48 ug/dL (ref 27–159)
Total Iron Binding Capacity: 348 ug/dL (ref 250–450)
UIBC: 300 ug/dL (ref 131–425)

## 2023-12-13 LAB — CBC
Hematocrit: 38.5 % (ref 34.0–46.6)
Hemoglobin: 11.5 g/dL (ref 11.1–15.9)
MCH: 23.2 pg — ABNORMAL LOW (ref 26.6–33.0)
MCHC: 29.9 g/dL — ABNORMAL LOW (ref 31.5–35.7)
MCV: 78 fL — ABNORMAL LOW (ref 79–97)
Platelets: 360 x10E3/uL (ref 150–450)
RBC: 4.96 x10E6/uL (ref 3.77–5.28)
RDW: 15.6 % — ABNORMAL HIGH (ref 11.7–15.4)
WBC: 5.7 x10E3/uL (ref 3.4–10.8)

## 2023-12-17 ENCOUNTER — Ambulatory Visit: Payer: Self-pay | Admitting: Internal Medicine

## 2023-12-18 ENCOUNTER — Other Ambulatory Visit (INDEPENDENT_AMBULATORY_CARE_PROVIDER_SITE_OTHER): Payer: Self-pay

## 2023-12-18 ENCOUNTER — Ambulatory Visit

## 2023-12-18 DIAGNOSIS — Z23 Encounter for immunization: Secondary | ICD-10-CM | POA: Diagnosis not present

## 2023-12-18 DIAGNOSIS — Z0184 Encounter for antibody response examination: Secondary | ICD-10-CM

## 2023-12-18 DIAGNOSIS — Z789 Other specified health status: Secondary | ICD-10-CM

## 2023-12-18 NOTE — Progress Notes (Unsigned)
 Patient presents today for Tdap injection. Patient denies any cold like symptoms.

## 2023-12-19 LAB — MEASLES/MUMPS/RUBELLA IMMUNITY
MUMPS ABS, IGG: <9 [AU]/ml — ABNORMAL LOW (ref >10.9)
RUBEOLA AB, IGG: 19.2 [AU]/ml (ref >16.4)
Rubella Antibodies, IGG: 1.66 {index} (ref >0.99)

## 2023-12-19 LAB — VARICELLA ZOSTER ANTIBODY, IGG: Varicella zoster IgG: REACTIVE

## 2023-12-20 ENCOUNTER — Ambulatory Visit: Payer: Self-pay | Admitting: Internal Medicine

## 2024-02-04 ENCOUNTER — Telehealth

## 2024-02-04 ENCOUNTER — Telehealth: Admitting: Family

## 2024-02-04 DIAGNOSIS — B3731 Acute candidiasis of vulva and vagina: Secondary | ICD-10-CM | POA: Diagnosis not present

## 2024-02-04 MED ORDER — FLUCONAZOLE 150 MG PO TABS: 150.0000 mg | ORAL_TABLET | ORAL | 0 refills | Status: AC | PRN

## 2024-02-04 NOTE — Progress Notes (Signed)

## 2024-02-04 NOTE — Progress Notes (Signed)
Approximately 5 minutes was spent documenting and reviewing patient's chart.

## 2024-02-09 ENCOUNTER — Telehealth: Admitting: Physician Assistant

## 2024-02-09 DIAGNOSIS — R3989 Other symptoms and signs involving the genitourinary system: Secondary | ICD-10-CM

## 2024-02-09 MED ORDER — NITROFURANTOIN MONOHYD MACRO 100 MG PO CAPS: 100.0000 mg | ORAL_CAPSULE | Freq: Two times a day (BID) | ORAL | 0 refills | Status: AC

## 2024-02-09 NOTE — Patient Instructions (Signed)
 Rebecca Nicholson, thank you for joining Rebecca [name], PA-C for today's virtual visit.  While this provider is not your primary care provider (PCP), if your PCP is located in our provider database this encounter information will be shared with them immediately following your visit.   A [facility] MyChart account gives you access to today's visit and all your visits, tests, and labs performed at [facility]  click here if you don't have a [facility] MyChart account or go to [email or web address]  Consent: (Patient) Rebecca Nicholson provided verbal consent for this virtual visit at the beginning of the encounter.  Current Medications:  Current Outpatient Medications:    nitrofurantoin , macrocrystal-monohydrate, (MACROBID ) 100 MG capsule, Take 1 capsule (100 mg total) by mouth 2 (two) times daily., Disp: 10 capsule, Rfl: 0   albuterol  (PROVENTIL ) (2.5 MG/3ML) 0.083% nebulizer solution, Take 3 mLs (2.5 mg total) by nebulization every 4 (four) hours as needed for wheezing or shortness of breath., Disp: 75 mL, Rfl: 2   albuterol  (VENTOLIN  HFA) 108 (90 Base) MCG/ACT inhaler, Inhale 2 puffs into the lungs every 6 (six) hours as needed for wheezing or shortness of breath., Disp: 3 each, Rfl: 3   amLODipine  (NORVASC ) 2.5 MG tablet, Take 1 tablet (2.5 mg total) by mouth daily., Disp: 90 tablet, Rfl: 2   Blood Pressure Monitoring (BLOOD PRESSURE KIT) DEVI, 1 Device by Does not apply route once a week., Disp: 1 each, Rfl: 0   budesonide -formoterol  (SYMBICORT ) 80-4.5 MCG/ACT inhaler, Inhale 2 puffs into the lungs 2 (two) times daily., Disp: 3 each, Rfl: 3   cyclobenzaprine  (FLEXERIL ) 10 MG tablet, Take 1 tablet (10 mg total) by mouth 3 (three) times daily as needed for muscle spasms., Disp: 30 tablet, Rfl: 0   fluconazole  (DIFLUCAN ) 150 MG tablet, Take 1 tablet (150 mg total) by mouth every three (3) days as needed., Disp: 3 tablet, Rfl: 0   metFORMIN  (GLUCOPHAGE ) 500 MG tablet,  Take 1 tablet (500 mg total) by mouth 2 (two) times daily with a meal., Disp: 60 tablet, Rfl: 2   montelukast  (SINGULAIR ) 10 MG tablet, Take 1 tablet (10 mg total) by mouth daily., Disp: 90 tablet, Rfl: 1   mupirocin  ointment (BACTROBAN ) 2 %, Apply 1 Application topically 2 (two) times daily., Disp: 22 g, Rfl: 0   naproxen  (NAPROSYN ) 500 MG tablet, Take 1 tablet (500 mg total) by mouth 2 (two) times daily with a meal., Disp: 30 tablet, Rfl: 0   ondansetron  (ZOFRAN -ODT) 4 MG disintegrating tablet, 4mg  ODT q4 hours prn nausea/vomit, Disp: 10 tablet, Rfl: 0   Vitamin D , Ergocalciferol , (DRISDOL ) 1.25 MG (50000 UNIT) CAPS capsule, Take 1 capsule (50,000 Units total) by mouth 2 (two) times a week., Disp: 24 capsule, Rfl: 1   Medications ordered in this encounter:  Meds ordered this encounter  Medications   nitrofurantoin , macrocrystal-monohydrate, (MACROBID ) 100 MG capsule    Sig: Take 1 capsule (100 mg total) by mouth 2 (two) times daily.    Dispense:  10 capsule    Refill:  0    Supervising Provider:   BLAISE ALEENE KIDD [[id number]]     *If you need refills on other medications prior to your next appointment, please contact your pharmacy*  Follow-Up: Call back or seek an in-person evaluation if the symptoms worsen or if the condition fails to improve as anticipated.  [facility] [phone]  Other Instructions Urinary Tract Infection, Female A urinary tract infection (UTI) is an infection in  your urinary tract. The urinary tract is made up of organs that make, store, and get rid of pee (urine) in your body. These organs include: The kidneys. The ureters. The bladder. The urethra. What are the causes? Most UTIs are caused by germs called bacteria. They may be in or near your genitals. These germs grow and cause swelling in your urinary tract. What increases the risk? You're more likely to get a UTI if: You're a female. The urethra is shorter in females than in males. You  have a soft tube called a catheter that drains your pee. You can't control when you pee or poop. You have trouble peeing because of: A kidney stone. A urinary blockage. A nerve condition that affects your bladder. Not getting enough to drink. You're sexually active. You use a birth control inside your vagina, like spermicide. You're pregnant. You have low levels of the hormone estrogen in your body. You're an older adult. You're also more likely to get a UTI if you have other health problems. These may include: Diabetes. A weak immune system. Your immune system is your body's defense system. Sickle cell disease. Injury of the spine. What are the signs or symptoms? Symptoms may include: Needing to pee right away. Peeing small amounts often. Pain or burning when you pee. Blood in your pee. Pee that smells bad or odd. Pain in your belly or lower back. You may also: Feel confused. This may be the first symptom in older adults. Vomit. Not feel hungry. Feel tired or easily annoyed. Have a fever or chills. How is this diagnosed? A UTI is diagnosed based on your medical history and an exam. You may also have other tests. These may include: Pee tests. Blood tests. Tests for sexually transmitted infections (STIs). If you've had more than one UTI, you may need to have imaging studies done to find out why you keep getting them. How is this treated? A UTI can be treated by: Taking antibiotics or other medicines. Drinking enough fluid to keep your pee pale yellow. In rare cases, a UTI can cause a very bad condition called sepsis. Sepsis may be treated in the hospital. Follow these instructions at home: Medicines Take your medicines only as told by your health care provider. If you were given antibiotics, take them as told by your provider. Do not stop taking them even if you start to feel better. General instructions Make sure you: Pee often and fully. Do not hold your pee for a  long time. Wipe from front to back after you pee or poop. Use each tissue only once when you wipe. Pee after you have sex. Do not douche or use sprays or powders in your genital area. Contact a health care provider if: Your symptoms don't get better after 1-2 days of taking antibiotics. Your symptoms go away and then come back. You have a fever or chills. You vomit or feel like you may vomit. Get help right away if: You have very bad pain in your back or lower belly. You faint. This information is not intended to replace advice given to you by your health care provider. Make sure you discuss any questions you have with your health care provider. Document Revised: 04/26/2023 Document Reviewed: 08/19/2022 Elsevier Patient Education  [employer].   If you have been instructed to have an in-person evaluation today at a local Urgent Care facility, please use the link below. It will take you to a list of all of our  available [facility], including address, phone number and hours of operation. Please do not delay care.  [facility]  If you or a family member do not have a primary care provider, use the link below to schedule a visit and establish care. When you choose a [facility] primary care physician or advanced practice provider, you gain a long-term partner in health. Find a Primary Care Provider  Learn more about [facility]'s in-office and virtual care options: [facility] - Get Care Now

## 2024-02-09 NOTE — Progress Notes (Signed)
 Virtual Visit Consent   Rebecca Nicholson, you are scheduled for a virtual visit with a [facility] provider today. Just as with appointments in the office, your consent must be obtained to participate. Your consent will be active for this visit and any virtual visit you may have with one of our providers in the next 365 days. If you have a MyChart account, a copy of this consent can be sent to you electronically.  As this is a virtual visit, video technology does not allow for your provider to perform a traditional examination. This may limit your provider's ability to fully assess your condition. If your provider identifies any concerns that need to be evaluated in person or the need to arrange testing (such as labs, EKG, etc.), we will make arrangements to do so. Although advances in technology are sophisticated, we cannot ensure that it will always work on either your end or our end. If the connection with a video visit is poor, the visit may have to be switched to a telephone visit. With either a video or telephone visit, we are not always able to ensure that we have a secure connection.  By engaging in this virtual visit, you consent to the provision of healthcare and authorize for your insurance to be billed (if applicable) for the services provided during this visit. Depending on your insurance coverage, you may receive a charge related to this service.  I need to obtain your verbal consent now. Are you willing to proceed with your visit today? Rebecca Nicholson has provided verbal consent on 02/09/2024 for a virtual visit (video or telephone). Rebecca [name], PA-C  Date: 02/09/2024 3:21 PM   Virtual Visit via Video Note   I, Rebecca Nicholson, connected with  Rebecca Nicholson  ([id number], 05-25-1996) on 02/09/24 at  3:15 PM EDT by a video-enabled telemedicine application and verified that I am speaking with the correct person using two identifiers.  Location: Patient: Virtual  Visit Location Patient: Mobile Provider: Virtual Visit Location Provider: Home Office   I discussed the limitations of evaluation and management by telemedicine and the availability of in person appointments. The patient expressed understanding and agreed to proceed.    History of Present Illness: Rebecca Nicholson is a 28 y.o. who identifies as a female who was assigned female at birth, and is being seen today for dysuria.  HPI: Urinary Tract Infection  This is a new problem. The current episode started in the past 7 days (Completed E-visit on 02/04/24 and was suspected to have yeast infection and given Fluconazole ; this has not helped; Symptoms started Sunday). The problem occurs every urination. The problem has been unchanged. The quality of the pain is described as burning. There has been no fever. Associated symptoms include frequency. Pertinent negatives include no chills, discharge, flank pain, hematuria, hesitancy, nausea, possible pregnancy or urgency. Associated symptoms comments: Suprapubic pain, urine darker than normal. Treatments tried: fluconazole . The treatment provided no relief.     Problems:  Patient Active Problem List   Diagnosis Date Noted   Irregular menses 12/12/2023   Essential hypertension, benign 10/30/2023   Blurry vision, left eye 10/30/2023   Routine general medical examination at health care facility 07/15/2023   Pre-diabetes 07/15/2023   Community acquired pneumonia of right upper lobe of lung 05/01/2023   Strep throat 05/01/2023   Moderate persistent asthma without complication 05/01/2023   Class 3 severe obesity due to excess calories with serious comorbidity and body mass  index (BMI) of 40.0 to 44.9 in adult 05/01/2023   Elevated blood pressure reading 05/01/2023   Obesity with body mass index 30 or greater 02/06/2023   Anemia 09/01/2022   Supervision of other normal pregnancy, antepartum 08/31/2022   Other abnormal glucose 01/29/2022   Vitamin D   deficiency 08/28/2020   Morbid obesity (HCC) 11/18/2019    Allergies:  Allergies  Allergen Reactions   Cephalosporins Rash   Medications:  Current Outpatient Medications:    nitrofurantoin , macrocrystal-monohydrate, (MACROBID ) 100 MG capsule, Take 1 capsule (100 mg total) by mouth 2 (two) times daily., Disp: 10 capsule, Rfl: 0   albuterol  (PROVENTIL ) (2.5 MG/3ML) 0.083% nebulizer solution, Take 3 mLs (2.5 mg total) by nebulization every 4 (four) hours as needed for wheezing or shortness of breath., Disp: 75 mL, Rfl: 2   albuterol  (VENTOLIN  HFA) 108 (90 Base) MCG/ACT inhaler, Inhale 2 puffs into the lungs every 6 (six) hours as needed for wheezing or shortness of breath., Disp: 3 each, Rfl: 3   amLODipine  (NORVASC ) 2.5 MG tablet, Take 1 tablet (2.5 mg total) by mouth daily., Disp: 90 tablet, Rfl: 2   Blood Pressure Monitoring (BLOOD PRESSURE KIT) DEVI, 1 Device by Does not apply route once a week., Disp: 1 each, Rfl: 0   budesonide -formoterol  (SYMBICORT ) 80-4.5 MCG/ACT inhaler, Inhale 2 puffs into the lungs 2 (two) times daily., Disp: 3 each, Rfl: 3   cyclobenzaprine  (FLEXERIL ) 10 MG tablet, Take 1 tablet (10 mg total) by mouth 3 (three) times daily as needed for muscle spasms., Disp: 30 tablet, Rfl: 0   fluconazole  (DIFLUCAN ) 150 MG tablet, Take 1 tablet (150 mg total) by mouth every three (3) days as needed., Disp: 3 tablet, Rfl: 0   metFORMIN  (GLUCOPHAGE ) 500 MG tablet, Take 1 tablet (500 mg total) by mouth 2 (two) times daily with a meal., Disp: 60 tablet, Rfl: 2   montelukast  (SINGULAIR ) 10 MG tablet, Take 1 tablet (10 mg total) by mouth daily., Disp: 90 tablet, Rfl: 1   mupirocin  ointment (BACTROBAN ) 2 %, Apply 1 Application topically 2 (two) times daily., Disp: 22 g, Rfl: 0   naproxen  (NAPROSYN ) 500 MG tablet, Take 1 tablet (500 mg total) by mouth 2 (two) times daily with a meal., Disp: 30 tablet, Rfl: 0   ondansetron  (ZOFRAN -ODT) 4 MG disintegrating tablet, 4mg  ODT q4 hours prn  nausea/vomit, Disp: 10 tablet, Rfl: 0   Vitamin D , Ergocalciferol , (DRISDOL ) 1.25 MG (50000 UNIT) CAPS capsule, Take 1 capsule (50,000 Units total) by mouth 2 (two) times a week., Disp: 24 capsule, Rfl: 1  Observations/Objective: Patient is well-developed, well-nourished in no acute distress.  Resting comfortably   Head is normocephalic, atraumatic.  No labored breathing.  Speech is clear and coherent with logical content.  Patient is alert and oriented at baseline.    Assessment and Plan: 1. Suspected UTI (Primary) - nitrofurantoin , macrocrystal-monohydrate, (MACROBID ) 100 MG capsule; Take 1 capsule (100 mg total) by mouth 2 (two) times daily.  Dispense: 10 capsule; Refill: 0  - Worsening symptoms.  - Will treat empirically with Macrobid  - May use AZO for bladder spasms - Continue to push fluids.  - Seek in person evaluation for urine culture if symptoms do not improve or if they worsen.    Follow Up Instructions: I discussed the assessment and treatment plan with the patient. The patient was provided an opportunity to ask questions and all were answered. The patient agreed with the plan and demonstrated an understanding of the instructions.  A copy  of instructions were sent to the patient via MyChart unless otherwise noted below.    The patient was advised to call back or seek an in-person evaluation if the symptoms worsen or if the condition fails to improve as anticipated.    Rebecca [name], PA-C

## 2024-03-14 ENCOUNTER — Emergency Department (HOSPITAL_BASED_OUTPATIENT_CLINIC_OR_DEPARTMENT_OTHER)

## 2024-03-14 ENCOUNTER — Other Ambulatory Visit: Payer: Self-pay

## 2024-03-14 ENCOUNTER — Emergency Department (HOSPITAL_BASED_OUTPATIENT_CLINIC_OR_DEPARTMENT_OTHER)
Admission: EM | Admit: 2024-03-14 | Discharge: 2024-03-14 | Disposition: A | Discharge status: Home / Self Care | Source: Ambulatory Visit | Attending: Emergency Medicine | Admitting: Emergency Medicine

## 2024-03-14 DIAGNOSIS — Z7951 Long term (current) use of inhaled steroids: Secondary | ICD-10-CM | POA: Diagnosis not present

## 2024-03-14 DIAGNOSIS — Z79899 Other long term (current) drug therapy: Secondary | ICD-10-CM | POA: Insufficient documentation

## 2024-03-14 DIAGNOSIS — R110 Nausea: Secondary | ICD-10-CM

## 2024-03-14 DIAGNOSIS — D72829 Elevated white blood cell count, unspecified: Secondary | ICD-10-CM | POA: Insufficient documentation

## 2024-03-14 DIAGNOSIS — N3000 Acute cystitis without hematuria: Secondary | ICD-10-CM | POA: Diagnosis not present

## 2024-03-14 DIAGNOSIS — R1084 Generalized abdominal pain: Secondary | ICD-10-CM

## 2024-03-14 DIAGNOSIS — Z7984 Long term (current) use of oral hypoglycemic drugs: Secondary | ICD-10-CM | POA: Diagnosis not present

## 2024-03-14 DIAGNOSIS — R1031 Right lower quadrant pain: Secondary | ICD-10-CM | POA: Diagnosis present

## 2024-03-14 DIAGNOSIS — J45909 Unspecified asthma, uncomplicated: Secondary | ICD-10-CM | POA: Diagnosis not present

## 2024-03-14 LAB — COMPREHENSIVE METABOLIC PANEL WITH GFR
ALT: 9 U/L (ref 0–44)
AST: 13 U/L — ABNORMAL LOW (ref 15–41)
Albumin: 4.3 g/dL (ref 3.5–5.0)
Alkaline Phosphatase: 57 U/L (ref 38–126)
Anion gap: 11 (ref 5–15)
BUN: 13 mg/dL (ref 6–20)
CO2: 25 mmol/L (ref 22–32)
Calcium: 9.7 mg/dL (ref 8.9–10.3)
Chloride: 103 mmol/L (ref 98–111)
Creatinine, Ser: 0.77 mg/dL (ref 0.44–1.00)
GFR, Estimated: >60 mL/min (ref >60)
Glucose, Bld: 94 mg/dL (ref 70–99)
Potassium: 3.5 mmol/L (ref 3.5–5.1)
Sodium: 139 mmol/L (ref 135–145)
Total Bilirubin: 0.3 mg/dL (ref 0.0–1.2)
Total Protein: 7.4 g/dL (ref 6.5–8.1)

## 2024-03-14 LAB — URINALYSIS, ROUTINE W REFLEX MICROSCOPIC
Bacteria, UA: NONE SEEN
Bilirubin Urine: NEGATIVE
Glucose, UA: NEGATIVE mg/dL
Ketones, ur: NEGATIVE mg/dL
Nitrite: NEGATIVE
Specific Gravity, Urine: 1.016 (ref 1.005–1.030)
WBC, UA: >50 WBC/hpf (ref 0–5)
pH: 7.5 (ref 5.0–8.0)

## 2024-03-14 LAB — LIPASE, BLOOD: Lipase: 15 U/L (ref 11–51)

## 2024-03-14 LAB — CBC
HCT: 35.3 % — ABNORMAL LOW (ref 36.0–46.0)
Hemoglobin: 11 g/dL — ABNORMAL LOW (ref 12.0–15.0)
MCH: 23.3 pg — ABNORMAL LOW (ref 26.0–34.0)
MCHC: 31.2 g/dL (ref 30.0–36.0)
MCV: 74.6 fL — ABNORMAL LOW (ref 80.0–100.0)
Platelets: 298 K/uL (ref 150–400)
RBC: 4.73 MIL/uL (ref 3.87–5.11)
RDW: 17.2 % — ABNORMAL HIGH (ref 11.5–15.5)
WBC: 9.4 K/uL (ref 4.0–10.5)
nRBC: 0 % (ref 0.0–0.2)

## 2024-03-14 LAB — PREGNANCY, URINE: Preg Test, Ur: NEGATIVE

## 2024-03-14 MED ORDER — ONDANSETRON 4 MG PO TBDP: 4.0000 mg | ORAL_TABLET | Freq: Three times a day (TID) | ORAL | 0 refills | Status: AC | PRN

## 2024-03-14 MED ORDER — IOHEXOL 300 MG/ML  SOLN
100.0000 mL | Freq: Once | INTRAMUSCULAR | Status: AC | PRN
  Administered 2024-03-14: 100 mL via INTRAVENOUS

## 2024-03-14 MED ORDER — NITROFURANTOIN MONOHYD MACRO 100 MG PO CAPS
100.0000 mg | ORAL_CAPSULE | Freq: Once | ORAL | Status: AC
  Administered 2024-03-14: 100 mg via ORAL
  Filled 2024-03-14: qty 1

## 2024-03-14 MED ORDER — MORPHINE SULFATE (PF) 4 MG/ML IV SOLN
4.0000 mg | Freq: Once | INTRAVENOUS | Status: AC
  Administered 2024-03-14: 4 mg via INTRAVENOUS
  Filled 2024-03-14: qty 1

## 2024-03-14 MED ORDER — NITROFURANTOIN MONOHYD MACRO 100 MG PO CAPS: 100.0000 mg | ORAL_CAPSULE | Freq: Two times a day (BID) | ORAL | 0 refills | Status: AC

## 2024-03-14 MED ORDER — ONDANSETRON HCL 4 MG/2ML IJ SOLN
4.0000 mg | Freq: Once | INTRAMUSCULAR | Status: AC
  Administered 2024-03-14: 4 mg via INTRAVENOUS
  Filled 2024-03-14: qty 2

## 2024-03-14 MED ORDER — OXYCODONE-ACETAMINOPHEN 5-325 MG PO TABS: 1.0000 | ORAL_TABLET | ORAL | 0 refills | Status: DC | PRN

## 2024-03-14 MED ORDER — OXYCODONE-ACETAMINOPHEN 5-325 MG PO TABS
1.0000 | ORAL_TABLET | Freq: Once | ORAL | Status: AC
  Administered 2024-03-14: 1 via ORAL
  Filled 2024-03-14: qty 1

## 2024-03-14 NOTE — ED Provider Notes (Signed)
 [place] EMERGENCY DEPARTMENT AT [facility] Provider Note   CSN: [id number] Arrival date & time: 03/14/24  1437     Patient presents with: Abdominal Pain   Rebecca Nicholson is a 28 y.o. female.   The history is provided by the patient and medical records. No language interpreter was used.  Abdominal Pain Pain location:  RLQ, LLQ and suprapubic Pain quality: aching   Pain radiates to:  Does not radiate Pain severity:  Moderate Onset quality:  Gradual Duration:  2 days Timing:  Constant Progression:  Waxing and waning Chronicity:  New Context: not trauma   Worsened by:  Nothing Ineffective treatments:  None tried Associated symptoms: nausea   Associated symptoms: no chest pain, no chills, no constipation, no cough, no diarrhea, no dysuria, no fatigue, no fever, no shortness of breath, no vaginal bleeding, no vaginal discharge and no vomiting        Prior to Admission medications   Medication Sig Start Date End Date Taking? Authorizing Provider  albuterol  (PROVENTIL ) (2.5 MG/3ML) 0.083% nebulizer solution Take 3 mLs (2.5 mg total) by nebulization every 4 (four) hours as needed for wheezing or shortness of breath. 04/25/23 04/24/24  Jarold Medici, MD  albuterol  (VENTOLIN  HFA) 108 [phone] Base) MCG/ACT inhaler Inhale 2 puffs into the lungs every 6 (six) hours as needed for wheezing or shortness of breath. 07/05/23   Jarold Medici, MD  amLODipine  (NORVASC ) 2.5 MG tablet Take 1 tablet (2.5 mg total) by mouth daily. 12/12/23 12/11/24  Jarold Medici, MD  Blood Pressure Monitoring (BLOOD PRESSURE KIT) DEVI 1 Device by Does not apply route once a week. 08/31/22   Ervin, Michael L, MD  budesonide -formoterol  (SYMBICORT ) 80-4.5 MCG/ACT inhaler Inhale 2 puffs into the lungs 2 (two) times daily. 07/15/23   Jarold Medici, MD  cyclobenzaprine  (FLEXERIL ) 10 MG tablet Take 1 tablet (10 mg total) by mouth 3 (three) times daily as needed for muscle spasms. 11/13/23   Blair, Diane W, FNP   fluconazole  (DIFLUCAN ) 150 MG tablet Take 1 tablet (150 mg total) by mouth every three (3) days as needed. 02/04/24   Lavell Bari LABOR, FNP  metFORMIN  (GLUCOPHAGE ) 500 MG tablet Take 1 tablet (500 mg total) by mouth 2 (two) times daily with a meal. 07/05/22   Georgina Speaks, FNP  montelukast  (SINGULAIR ) 10 MG tablet Take 1 tablet (10 mg total) by mouth daily. 06/27/22 12/12/23  Georgina Speaks, FNP  mupirocin  ointment (BACTROBAN ) 2 % Apply 1 Application topically 2 (two) times daily. 09/24/22   Teresa Shelba SAUNDERS, NP  naproxen  (NAPROSYN ) 500 MG tablet Take 1 tablet (500 mg total) by mouth 2 (two) times daily with a meal. 11/13/23   Almeda Loa ORN, FNP  nitrofurantoin , macrocrystal-monohydrate, (MACROBID ) 100 MG capsule Take 1 capsule (100 mg total) by mouth 2 (two) times daily. 02/09/24   Vivienne Delon HERO, PA-C  ondansetron  (ZOFRAN -ODT) 4 MG disintegrating tablet 4mg  ODT q4 hours prn nausea/vomit 08/12/23   Pollina, Lonni PARAS, MD  Vitamin D , Ergocalciferol , (DRISDOL ) 1.25 MG (50000 UNIT) CAPS capsule Take 1 capsule (50,000 Units total) by mouth 2 (two) times a week. 07/04/22   Georgina Speaks, FNP  hyoscyamine  (LEVSIN  SL) 0.125 MG SL tablet Place 1 tablet (0.125 mg total) under the tongue 2 (two) times daily. 06/11/18 03/05/19  Nandigam, Kavitha V, MD  medroxyPROGESTERone (DEPO-PROVERA) 150 MG/ML injection Inject 150 mg into the muscle every 3 (three) months.  03/05/19  [provider]    Allergies: Cephalosporins    Review of  Systems  Constitutional:  Negative for chills, fatigue and fever.  HENT:  Negative for congestion.   Eyes:  Negative for visual disturbance.  Respiratory:  Negative for cough, chest tightness and shortness of breath.   Cardiovascular:  Negative for chest pain.  Gastrointestinal:  Positive for abdominal pain and nausea. Negative for constipation, diarrhea and vomiting.  Genitourinary:  Positive for frequency. Negative for dysuria, flank pain, vaginal bleeding and vaginal  discharge.  Musculoskeletal:  Negative for back pain, neck pain and neck stiffness.  Skin:  Negative for rash and wound.  Neurological:  Negative for light-headedness and headaches.  Psychiatric/Behavioral:  Negative for agitation and confusion.   All other systems reviewed and are negative.   Updated Vital Signs BP 120/60 (BP Location: Right Arm)   Pulse 71   Temp 97.8 F (36.6 C)   Resp 16   SpO2 100%   Physical Exam Vitals and nursing note reviewed.  Constitutional:      General: She is not in acute distress.    Appearance: She is well-developed. She is not diaphoretic.  HENT:     Head: Normocephalic and atraumatic.     Right Ear: External ear normal.     Left Ear: External ear normal.     Nose: Nose normal.     Mouth/Throat:     Pharynx: No oropharyngeal exudate.  Eyes:     Conjunctiva/sclera: Conjunctivae normal.     Pupils: Pupils are equal, round, and reactive to light.  Cardiovascular:     Rate and Rhythm: Normal rate.  Pulmonary:     Effort: No respiratory distress.     Breath sounds: No stridor. No wheezing, rhonchi or rales.  Chest:     Chest wall: No tenderness.  Abdominal:     General: Abdomen is flat. Bowel sounds are normal. There is no distension.     Tenderness: There is abdominal tenderness in the right lower quadrant and suprapubic area. There is no right CVA tenderness, left CVA tenderness, guarding or rebound.  Musculoskeletal:     Cervical back: Normal range of motion and neck supple.  Skin:    General: Skin is warm.     Capillary Refill: Capillary refill takes less than 2 seconds.     Findings: No erythema or rash.  Neurological:     Mental Status: She is alert.     Motor: No abnormal muscle tone.     Coordination: Coordination normal.     Deep Tendon Reflexes: Reflexes are normal and symmetric.     (all labs ordered are listed, but only abnormal results are displayed) Labs Reviewed  COMPREHENSIVE METABOLIC PANEL WITH GFR - Abnormal;  Notable for the following components:      Result Value   AST 13 (*)    All other components within normal limits  CBC - Abnormal; Notable for the following components:   Hemoglobin 11.0 (*)    HCT 35.3 (*)    MCV 74.6 (*)    MCH 23.3 (*)    RDW 17.2 (*)    All other components within normal limits  URINALYSIS, ROUTINE W REFLEX MICROSCOPIC - Abnormal; Notable for the following components:   Hgb urine dipstick SMALL (*)    Protein, ur TRACE (*)    Leukocytes,Ua MODERATE (*)    All other components within normal limits  LIPASE, BLOOD  PREGNANCY, URINE    EKG: None  Radiology: CT ABDOMEN PELVIS W CONTRAST Result Date: 03/14/2024 CLINICAL DATA:  Right lower quadrant pain.  EXAM: CT ABDOMEN AND PELVIS WITH CONTRAST TECHNIQUE: Multidetector CT imaging of the abdomen and pelvis was performed using the standard protocol following bolus administration of intravenous contrast. RADIATION DOSE REDUCTION: This exam was performed according to the departmental dose-optimization program which includes automated exposure control, adjustment of the mA and/or kV according to patient size and/or use of iterative reconstruction technique. CONTRAST:  OMNIPAQUE  IOHEXOL  300 MG/ML  SOLN COMPARISON:  August 11, 2023 FINDINGS: Lower chest: No acute abnormality. Hepatobiliary: No focal liver abnormality is seen. No gallstones, gallbladder wall thickening, or biliary dilatation. Pancreas: Unremarkable. No pancreatic ductal dilatation or surrounding inflammatory changes. Spleen: Normal in size without focal abnormality. Adrenals/Urinary Tract: Adrenal glands are unremarkable. Kidneys are normal, without renal calculi, focal lesion, or hydronephrosis. The urinary bladder is poorly distended and subsequently limited in evaluation. Marked severity diffuse urinary bladder wall thickening is seen. Stomach/Bowel: Stomach is within normal limits. Appendix appears normal. No evidence of bowel wall thickening, distention, or  inflammatory changes. Vascular/Lymphatic: No significant vascular findings are present. 1.5 cm 1.6 cm right lower quadrant mesenteric lymph nodes are seen. Reproductive: Uterus and bilateral adnexa are unremarkable. Other: No abdominal wall hernia or abnormality. No abdominopelvic ascites. Musculoskeletal: No acute or significant osseous findings. IMPRESSION: 1. Marked severity diffuse urinary bladder wall thickening which may represent sequelae associated with cystitis. Correlation with urinalysis is recommended. 2. Enlarged right lower quadrant mesenteric lymph nodes which may represent mesenteric adenitis. Electronically Signed   By: Suzen Dials M.D.   On: 03/14/2024 17:44     Procedures   Medications Ordered in the ED  iohexol  (OMNIPAQUE ) 300 MG/ML solution 100 mL (100 mLs Intravenous Contrast Given 03/14/24 1716)  morphine  (PF) 4 MG/ML injection 4 mg (4 mg Intravenous Given 03/14/24 1937)  ondansetron  (ZOFRAN ) injection 4 mg (4 mg Intravenous Given 03/14/24 1935)  nitrofurantoin  (macrocrystal-monohydrate) (MACROBID ) capsule 100 mg (100 mg Oral Given 03/14/24 2041)  oxyCODONE -acetaminophen  (PERCOCET/ROXICET) 5-325 MG per tablet 1 tablet (1 tablet Oral Given 03/14/24 2158)                                    Medical Decision Making Amount and/or Complexity of Data Reviewed Labs: ordered.  Risk Prescription drug management.    SANTIAGA BUTZIN is a 28 y.o. female with past medical history significant for asthma, hidradenitis, hypercholesterolemia, and obesity who presents with abdominal pain and urinary frequency.  According to patient, for the last 2 days she has had pain across her abdomen worse in her lower abdomen.  She reports no vomiting but is had some nausea.  She denies any fevers or chills.  She denies any congestion cough chest pain or shortness of breath.  Denies any vaginal complaints and denies any trauma.  Denies rashes to suggest shingles.  Patient was told to come  here to rule out appendicitis given the right lower quadrant abdominal pain.  She denies any ovarian pain and denies any pelvic discomfort.  On exam, lungs clear.  Chest nontender.  Abdomen is tender in the lower central and right lower abdomen.  Bowel sounds were appreciated.  Flanks and back nontender.  No rash seen.  Patient denied any pelvic symptoms.  Patient otherwise resting.  Patient had workup starting in triage including labs urinalysis and a CT scan to rule out appendicitis based on the chief complaint and recommendations from urgent care.  CT does not show evidence of acute cholecystitis or  appendicitis or diverticulitis but does show thickened bladder with inflammation concerning for acute cystitis.  Given the patient's frequency and lower abdominal tenderness and pain with this imaging finding we will treat for possible UTI.  She has leukocytes and white blood cells in the urine but does not show bacteria or nitrates.  Will send a urine culture and give her antibiotics.  Given her otherwise well appearance and improvement in symptoms anticipate she will be stable for discharge home if she passes a p.o. challenge so we will hold on IV antibiotics at this time.  Patient agrees.  The rest of her labs are overall reassuring and similar to prior.  Her hemoglobin is normal anemic and she reports this is similar.  No leukocytosis.  Metabolic panel and lipase normal.  Due to her cephalosporin allergy  will give a dose of nitrofurantoin  and if she tolerates this, plan for discharge with antibiotics, pain medicine and nausea medicine.  Patient agrees with this plan, dissipate reassessment after medications and p.o. challenge.  Patient tolerated nitrofurantoin  and was feeling better after some pain medicine.  She would like to go home.  Given the workup results will give prescription for antibiotics, pain medicine and nausea medicine and she will follow-up for outpatient management of suspected urinary  tract infection with significant abdominal pain.      Final diagnoses:  Acute cystitis without hematuria  Generalized abdominal pain  Nausea    ED Discharge Orders          Ordered    nitrofurantoin , macrocrystal-monohydrate, (MACROBID ) 100 MG capsule  2 times daily        03/14/24 2318    ondansetron  (ZOFRAN -ODT) 4 MG disintegrating tablet  Every 8 hours PRN        03/14/24 2318    oxyCODONE -acetaminophen  (PERCOCET/ROXICET) 5-325 MG tablet  Every 4 hours PRN        03/14/24 2318            Clinical Impression: 1. Acute cystitis without hematuria   2. Generalized abdominal pain   3. Nausea     Disposition: Discharge  Condition: Good  I have discussed the results, Dx and Tx plan with the pt(& family if present). He/she/they expressed understanding and agree(s) with the plan. Discharge instructions discussed at great length. Strict return precautions discussed and pt &/or family have verbalized understanding of the instructions. No further questions at time of discharge.    New Prescriptions   NITROFURANTOIN , MACROCRYSTAL-MONOHYDRATE, (MACROBID ) 100 MG CAPSULE    Take 1 capsule (100 mg total) by mouth 2 (two) times daily for 7 days.   ONDANSETRON  (ZOFRAN -ODT) 4 MG DISINTEGRATING TABLET    Take 1 tablet (4 mg total) by mouth every 8 (eight) hours as needed for nausea or vomiting.   OXYCODONE -ACETAMINOPHEN  (PERCOCET/ROXICET) 5-325 MG TABLET    Take 1 tablet by mouth every 4 (four) hours as needed for severe pain (pain score 7-10).    Follow Up: Jarold Medici, MD [address] [place] [address] [place] [address] [phone]     [facility] Emergency Department at [facility] [address] [place]  [address] [phone]         Kerron Sedano, Lonni PARAS, MD 03/14/24 [phone]

## 2024-03-14 NOTE — Discharge Instructions (Signed)
 Your history, exam, workup today are consistent with a likely urinary tract infection causing the abdominal pain and nausea symptoms.  Your CT scan did not show clear evidence of appendicitis however we discussed there is a small chance there still could be appendicitis just not seen on the initial CT.  Please follow-up with your primary doctor and use the antibiotics, pain medicine and nausea medicine.  Please rest and stay hydrated.  If any symptoms change or worsen acutely, please return to the nearest emergency department.

## 2024-03-14 NOTE — ED Triage Notes (Signed)
 Patient reports seen at [facility] for RLQ pain that began yesterday. Sent to rule out appendicitis. Denies n/v/d.

## 2024-04-04 ENCOUNTER — Emergency Department (HOSPITAL_COMMUNITY)

## 2024-04-04 ENCOUNTER — Encounter (HOSPITAL_COMMUNITY): Payer: Self-pay

## 2024-04-04 ENCOUNTER — Emergency Department (HOSPITAL_COMMUNITY): Admission: EM | Admit: 2024-04-04 | Discharge: 2024-04-05 | Disposition: A | Discharge status: Home / Self Care

## 2024-04-04 ENCOUNTER — Other Ambulatory Visit: Payer: Self-pay

## 2024-04-04 DIAGNOSIS — I10 Essential (primary) hypertension: Secondary | ICD-10-CM | POA: Insufficient documentation

## 2024-04-04 DIAGNOSIS — N83201 Unspecified ovarian cyst, right side: Secondary | ICD-10-CM | POA: Diagnosis not present

## 2024-04-04 DIAGNOSIS — J4540 Moderate persistent asthma, uncomplicated: Secondary | ICD-10-CM | POA: Insufficient documentation

## 2024-04-04 DIAGNOSIS — R1031 Right lower quadrant pain: Secondary | ICD-10-CM | POA: Diagnosis present

## 2024-04-04 DIAGNOSIS — Z79899 Other long term (current) drug therapy: Secondary | ICD-10-CM | POA: Insufficient documentation

## 2024-04-04 LAB — URINALYSIS, ROUTINE W REFLEX MICROSCOPIC
Bilirubin Urine: NEGATIVE
Glucose, UA: NEGATIVE mg/dL
Hgb urine dipstick: NEGATIVE
Ketones, ur: NEGATIVE mg/dL
Nitrite: NEGATIVE
Protein, ur: NEGATIVE mg/dL
Specific Gravity, Urine: 1.021 (ref 1.005–1.030)
pH: 6 (ref 5.0–8.0)

## 2024-04-04 LAB — COMPREHENSIVE METABOLIC PANEL WITH GFR
ALT: <5 U/L (ref 0–44)
AST: 17 U/L (ref 15–41)
Albumin: 4.1 g/dL (ref 3.5–5.0)
Alkaline Phosphatase: 54 U/L (ref 38–126)
Anion gap: 9 (ref 5–15)
BUN: 17 mg/dL (ref 6–20)
CO2: 24 mmol/L (ref 22–32)
Calcium: 9.8 mg/dL (ref 8.9–10.3)
Chloride: 105 mmol/L (ref 98–111)
Creatinine, Ser: 0.7 mg/dL (ref 0.44–1.00)
GFR, Estimated: >60 mL/min (ref >60)
Glucose, Bld: 99 mg/dL (ref 70–99)
Potassium: 3.8 mmol/L (ref 3.5–5.1)
Sodium: 138 mmol/L (ref 135–145)
Total Bilirubin: 0.3 mg/dL (ref 0.0–1.2)
Total Protein: 7.2 g/dL (ref 6.5–8.1)

## 2024-04-04 LAB — CBC WITH DIFFERENTIAL/PLATELET
Abs Immature Granulocytes: 0.01 K/uL (ref 0.00–0.07)
Basophils Absolute: 0 K/uL (ref 0.0–0.1)
Basophils Relative: 0 %
Eosinophils Absolute: 0.1 K/uL (ref 0.0–0.5)
Eosinophils Relative: 1 %
HCT: 36.9 % (ref 36.0–46.0)
Hemoglobin: 11.1 g/dL — ABNORMAL LOW (ref 12.0–15.0)
Immature Granulocytes: 0 %
Lymphocytes Relative: 36 %
Lymphs Abs: 2.8 K/uL (ref 0.7–4.0)
MCH: 22.9 pg — ABNORMAL LOW (ref 26.0–34.0)
MCHC: 30.1 g/dL (ref 30.0–36.0)
MCV: 76.1 fL — ABNORMAL LOW (ref 80.0–100.0)
Monocytes Absolute: 0.7 K/uL (ref 0.1–1.0)
Monocytes Relative: 9 %
Neutro Abs: 4.2 K/uL (ref 1.7–7.7)
Neutrophils Relative %: 54 %
Platelets: 371 K/uL (ref 150–400)
RBC: 4.85 MIL/uL (ref 3.87–5.11)
RDW: 17.2 % — ABNORMAL HIGH (ref 11.5–15.5)
WBC: 7.8 K/uL (ref 4.0–10.5)
nRBC: 0 % (ref 0.0–0.2)

## 2024-04-04 LAB — LIPASE, BLOOD: Lipase: 15 U/L (ref 11–51)

## 2024-04-04 LAB — PREGNANCY, URINE: Preg Test, Ur: NEGATIVE

## 2024-04-04 MED ORDER — IOHEXOL 300 MG/ML  SOLN
100.0000 mL | Freq: Once | INTRAMUSCULAR | Status: AC | PRN
  Administered 2024-04-04: 100 mL via INTRAVENOUS

## 2024-04-04 MED ORDER — OXYCODONE HCL 5 MG PO TABS
5.0000 mg | ORAL_TABLET | Freq: Once | ORAL | Status: AC
  Administered 2024-04-04: 5 mg via ORAL
  Filled 2024-04-04: qty 1

## 2024-04-04 NOTE — Discharge Instructions (Addendum)
 Please immediately go to [facility] ([facility]) for further evaluation and a pelvic ultrasound.

## 2024-04-04 NOTE — ED Provider Notes (Signed)
 [place] EMERGENCY DEPARTMENT AT [facility] Provider Note   CSN: [id number] Arrival date & time: 04/04/24  8076     Patient presents with: Flank Pain   Rebecca Nicholson is a 28 y.o. female who presents emergency department with a chief complaint of right lower quadrant abdominal pain.  Patient was recently seen for similar approximately 2 weeks ago by urgent care and then referred to [facility] emergency department.  At [facility] a CT scan of abdomen and pelvis was completed which did not show any evidence of appendicitis.  It did however show evidence of cystitis and patient was discharged on Macrobid  due to her cephalosporin allergy .  Patient states that symptoms subsided but then returned today.  Denies urinary frequency, dysuria, hematuria.  Denies flank pain.  Denies back pain.  Denies fever or chills.  Denies chest pain or shortness of breath.  Denies nausea, vomiting.  Past medical history significant for obesity, moderate persistent asthma, hypertension, etc.  {Add pertinent medical, surgical, social history, OB history to HPI:32947}  Flank Pain       Prior to Admission medications   Medication Sig Start Date End Date Taking? Authorizing Provider  albuterol  (PROVENTIL ) (2.5 MG/3ML) 0.083% nebulizer solution Take 3 mLs (2.5 mg total) by nebulization every 4 (four) hours as needed for wheezing or shortness of breath. 04/25/23 04/24/24  Jarold Medici, MD  albuterol  (VENTOLIN  HFA) 108 [phone] Base) MCG/ACT inhaler Inhale 2 puffs into the lungs every 6 (six) hours as needed for wheezing or shortness of breath. 07/05/23   Jarold Medici, MD  amLODipine  (NORVASC ) 2.5 MG tablet Take 1 tablet (2.5 mg total) by mouth daily. 12/12/23 12/11/24  Jarold Medici, MD  Blood Pressure Monitoring (BLOOD PRESSURE KIT) DEVI 1 Device by Does not apply route once a week. 08/31/22   Ervin, Michael L, MD  budesonide -formoterol  (SYMBICORT ) 80-4.5 MCG/ACT inhaler Inhale 2 puffs into the lungs 2 (two)  times daily. 07/15/23   Jarold Medici, MD  cyclobenzaprine  (FLEXERIL ) 10 MG tablet Take 1 tablet (10 mg total) by mouth 3 (three) times daily as needed for muscle spasms. 11/13/23   Blair, Diane W, FNP  fluconazole  (DIFLUCAN ) 150 MG tablet Take 1 tablet (150 mg total) by mouth every three (3) days as needed. 02/04/24   Lavell Bari LABOR, FNP  metFORMIN  (GLUCOPHAGE ) 500 MG tablet Take 1 tablet (500 mg total) by mouth 2 (two) times daily with a meal. 07/05/22   Georgina Speaks, FNP  montelukast  (SINGULAIR ) 10 MG tablet Take 1 tablet (10 mg total) by mouth daily. 06/27/22 12/12/23  Georgina Speaks, FNP  mupirocin  ointment (BACTROBAN ) 2 % Apply 1 Application topically 2 (two) times daily. 09/24/22   Teresa Shelba SAUNDERS, NP  naproxen  (NAPROSYN ) 500 MG tablet Take 1 tablet (500 mg total) by mouth 2 (two) times daily with a meal. 11/13/23   Almeda Loa ORN, FNP  nitrofurantoin , macrocrystal-monohydrate, (MACROBID ) 100 MG capsule Take 1 capsule (100 mg total) by mouth 2 (two) times daily. 02/09/24   Vivienne Delon HERO, PA-C  ondansetron  (ZOFRAN -ODT) 4 MG disintegrating tablet 4mg  ODT q4 hours prn nausea/vomit 08/12/23   Pollina, Lonni PARAS, MD  ondansetron  (ZOFRAN -ODT) 4 MG disintegrating tablet Take 1 tablet (4 mg total) by mouth every 8 (eight) hours as needed for nausea or vomiting. 03/14/24   Tegeler, Lonni PARAS, MD  oxyCODONE -acetaminophen  (PERCOCET/ROXICET) 5-325 MG tablet Take 1 tablet by mouth every 4 (four) hours as needed for severe pain (pain score 7-10). 03/14/24   Tegeler, Lonni PARAS, MD  Vitamin D , Ergocalciferol , (DRISDOL ) 1.25 MG (50000 UNIT) CAPS capsule Take 1 capsule (50,000 Units total) by mouth 2 (two) times a week. 07/04/22   Georgina Speaks, FNP  hyoscyamine  (LEVSIN  SL) 0.125 MG SL tablet Place 1 tablet (0.125 mg total) under the tongue 2 (two) times daily. 06/11/18 03/05/19  Nandigam, Kavitha V, MD  medroxyPROGESTERone (DEPO-PROVERA) 150 MG/ML injection Inject 150 mg into the muscle every 3 (three)  months.  03/05/19  [provider]    Allergies: Cephalosporins    Review of Systems  Genitourinary:  Positive for flank pain.    Updated Vital Signs BP (!) 136/92 (BP Location: Right Arm)   Pulse 72   Temp 98.3 F (36.8 C) (Oral)   Resp 17   Ht 5' 7 (1.702 m)   Wt 113.4 kg   LMP 03/18/2024 (Approximate)   SpO2 100%   BMI 39.16 kg/m   Physical Exam Vitals and nursing note reviewed.  Constitutional:      General: She is awake. She is not in acute distress.    Appearance: Normal appearance. She is not ill-appearing, toxic-appearing or diaphoretic.  HENT:     Head: Normocephalic and atraumatic.  Eyes:     General: No scleral icterus. Pulmonary:     Effort: Pulmonary effort is normal. No respiratory distress.  Abdominal:     General: Abdomen is flat. There is no distension.     Palpations: Abdomen is soft.     Tenderness: There is abdominal tenderness (Right lower quadrant tenderness to palpation). There is no right CVA tenderness, left CVA tenderness, guarding or rebound.  Musculoskeletal:     Right lower leg: No edema.     Left lower leg: No edema.  Skin:    General: Skin is warm.     Capillary Refill: Capillary refill takes less than 2 seconds.     Comments: No rashes or lesions overlying the stomach  Neurological:     General: No focal deficit present.     Mental Status: She is alert and oriented to person, place, and time.  Psychiatric:        Mood and Affect: Mood normal.        Behavior: Behavior normal. Behavior is cooperative.     (all labs ordered are listed, but only abnormal results are displayed) Labs Reviewed  CBC WITH DIFFERENTIAL/PLATELET - Abnormal; Notable for the following components:      Result Value   Hemoglobin 11.1 (*)    MCV 76.1 (*)    MCH 22.9 (*)    RDW 17.2 (*)    All other components within normal limits  COMPREHENSIVE METABOLIC PANEL WITH GFR  LIPASE, BLOOD  URINALYSIS, ROUTINE W REFLEX MICROSCOPIC  PREGNANCY, URINE     EKG: None  Radiology: No results found.  {Document cardiac monitor, telemetry assessment procedure when appropriate:32947} Procedures   Medications Ordered in the ED  oxyCODONE  (Oxy IR/ROXICODONE ) immediate release tablet 5 mg (has no administration in time range)      {Click here for ABCD2, HEART and other calculators REFRESH Note before signing:1}                              Medical Decision Making Amount and/or Complexity of Data Reviewed Radiology: ordered.  Risk Prescription drug management.   Patient presents to the ED for concern of ***, this involves an extensive number of treatment options, and is a complaint that carries with it a  high risk of complications and morbidity.  The differential diagnosis includes ***   Co morbidities that complicate the patient evaluation  ***   Additional history obtained:  Additional history obtained from *** {Blank multiple:19196::EMS,Family,Nursing,Outside Medical Records,Past Admission}   External records from outside source obtained and reviewed including ***   Lab Tests:  I Ordered, and personally interpreted labs.  The pertinent results include:  ***   Imaging Studies ordered:  I ordered imaging studies including ***  I independently visualized and interpreted imaging which showed *** I agree with the radiologist interpretation   Cardiac Monitoring:  The patient was maintained on a cardiac monitor.  I personally viewed and interpreted the cardiac monitored which showed an underlying rhythm of: ***   Medicines ordered and prescription drug management:  I ordered medication including ***  for ***  Reevaluation of the patient after these medicines showed that the patient {resolved/improved/worsened:23923::improved} I have reviewed the patients home medicines and have made adjustments as needed   Test Considered:  ***   Critical Interventions:  ***   Consultations Obtained:  I  requested consultation with the ***,  and discussed lab and imaging findings as well as pertinent plan - they recommend: ***   Problem List / ED Course:  28 year old female, vital signs stable, presents emergency department with a chief complaint of right lower quadrant abdominal pain.  Patient recently seen for same approximately 2 weeks ago with CT scan significant for possible cystitis.  Patient treated with improvement in symptoms however states that symptoms returned.  Denies fever or chills. On physical exam patient abdomen is soft however she does have focal tenderness to the right lower quadrant.  Denies nausea or vomiting.  Denies pelvic pain.  Low clinical suspicion for ovarian torsion or pelvic pathology.  Denies urinary symptoms. Will obtain abdominal pain workup including CBC, CMP, UA, lipase Unfortunately I do think we will have to repeat CT of abdomen and pelvis as patient has focal tenderness to right lower quadrant   Reevaluation:  After the interventions noted above, I reevaluated the patient and found that they have :{resolved/improved/worsened:23923::improved}   Social Determinants of Health:  ***   Dispostion:  After consideration of the diagnostic results and the patients response to treatment, I feel that the patent would benefit from ***.    {Document critical care time when appropriate  Document review of labs and clinical decision tools ie CHADS2VASC2, etc  Document your independent review of radiology images and any outside records  Document your discussion with family members, caretakers and with consultants  Document social determinants of health affecting pt's care  Document your decision making why or why not admission, treatments were needed:32947:::1}   Final diagnoses:  None    ED Discharge Orders     None

## 2024-04-04 NOTE — ED Triage Notes (Signed)
 PT was seen at [facility] about 2 weeks ago what told she had a bladder infection and was given Abx. PT stated that she took all the Abx and was told to come back if the pain returned. PT states that the Pain started about 16:00 and took a half of the pain pill she was prescribed about 17:00

## 2024-04-23 ENCOUNTER — Ambulatory Visit: Payer: Self-pay | Admitting: Internal Medicine

## 2024-04-23 ENCOUNTER — Encounter: Payer: Self-pay | Admitting: Internal Medicine

## 2024-04-23 VITALS — BP 118/80 | HR 85 | Temp 98.3°F | Ht 67.0 in | Wt 248.8 lb

## 2024-04-23 DIAGNOSIS — R7303 Prediabetes: Secondary | ICD-10-CM | POA: Diagnosis not present

## 2024-04-23 DIAGNOSIS — N83201 Unspecified ovarian cyst, right side: Secondary | ICD-10-CM

## 2024-04-23 DIAGNOSIS — I10 Essential (primary) hypertension: Secondary | ICD-10-CM

## 2024-04-23 DIAGNOSIS — D500 Iron deficiency anemia secondary to blood loss (chronic): Secondary | ICD-10-CM | POA: Diagnosis not present

## 2024-04-23 DIAGNOSIS — E559 Vitamin D deficiency, unspecified: Secondary | ICD-10-CM | POA: Diagnosis not present

## 2024-04-23 NOTE — Patient Instructions (Signed)
 Hypertension, Adult Hypertension is another name for high blood pressure. High blood pressure forces your heart to work harder to pump blood. This can cause problems over time. There are two numbers in a blood pressure reading. There is a top number (systolic) over a bottom number (diastolic). It is best to have a blood pressure that is below 120/80. What are the causes? The cause of this condition is not known. Some other conditions can lead to high blood pressure. What increases the risk? Some lifestyle factors can make you more likely to develop high blood pressure: Smoking. Not getting enough exercise or physical activity. Being overweight. Having too much fat, sugar, calories, or salt (sodium) in your diet. Drinking too much alcohol. Other risk factors include: Having any of these conditions: Heart disease. Diabetes. High cholesterol. Kidney disease. Obstructive sleep apnea. Having a family history of high blood pressure and high cholesterol. Age. The risk increases with age. Stress. What are the signs or symptoms? High blood pressure may not cause symptoms. Very high blood pressure (hypertensive crisis) may cause: Headache. Fast or uneven heartbeats (palpitations). Shortness of breath. Nosebleed. Vomiting or feeling like you may vomit (nauseous). Changes in how you see. Very bad chest pain. Feeling dizzy. Seizures. How is this treated? This condition is treated by making healthy lifestyle changes, such as: Eating healthy foods. Exercising more. Drinking less alcohol. Your doctor may prescribe medicine if lifestyle changes do not help enough and if: Your top number is above 130. Your bottom number is above 80. Your personal target blood pressure may vary. Follow these instructions at home: Eating and drinking  If told, follow the DASH eating plan. To follow this plan: Fill one half of your plate at each meal with fruits and vegetables. Fill one fourth of your plate  at each meal with whole grains. Whole grains include whole-wheat pasta, brown rice, and whole-grain bread. Eat or drink low-fat dairy products, such as skim milk or low-fat yogurt. Fill one fourth of your plate at each meal with low-fat (lean) proteins. Low-fat proteins include fish, chicken without skin, eggs, beans, and tofu. Avoid fatty meat, cured and processed meat, or chicken with skin. Avoid pre-made or processed food. Limit the amount of salt in your diet to less than 1,500 mg each day. Do not drink alcohol if: Your doctor tells you not to drink. You are pregnant, may be pregnant, or are planning to become pregnant. If you drink alcohol: Limit how much you have to: 0-1 drink a day for women. 0-2 drinks a day for men. Know how much alcohol is in your drink. In the [place] one drink equals one 12 oz bottle of beer (355 mL), one 5 oz glass of wine (148 mL), or one 1 oz glass of hard liquor (44 mL). Lifestyle  Work with your doctor to stay at a healthy weight or to lose weight. Ask your doctor what the best weight is for you. Get at least 30 minutes of exercise that causes your heart to beat faster (aerobic exercise) most days of the week. This may include walking, swimming, or biking. Get at least 30 minutes of exercise that strengthens your muscles (resistance exercise) at least 3 days a week. This may include lifting weights or doing Pilates. Do not smoke or use any products that contain nicotine or tobacco. If you need help quitting, ask your doctor. Check your blood pressure at home as told by your doctor. Keep all follow-up visits. Medicines Take over-the-counter and prescription medicines  only as told by your doctor. Follow directions carefully. Do not skip doses of blood pressure medicine. The medicine does not work as well if you skip doses. Skipping doses also puts you at risk for problems. Ask your doctor about side effects or reactions to medicines that you should watch  for. Contact a doctor if: You think you are having a reaction to the medicine you are taking. You have headaches that keep coming back. You feel dizzy. You have swelling in your ankles. You have trouble with your vision. Get help right away if: You get a very bad headache. You start to feel mixed up (confused). You feel weak or numb. You feel faint. You have very bad pain in your: Chest. Belly (abdomen). You vomit more than once. You have trouble breathing. These symptoms may be an emergency. Get help right away. Call 911. Do not wait to see if the symptoms will go away. Do not drive yourself to the hospital. Summary Hypertension is another name for high blood pressure. High blood pressure forces your heart to work harder to pump blood. For most people, a normal blood pressure is less than 120/80. Making healthy choices can help lower blood pressure. If your blood pressure does not get lower with healthy choices, you may need to take medicine. This information is not intended to replace advice given to you by your health care provider. Make sure you discuss any questions you have with your health care provider. Document Revised: 03/04/2021 Document Reviewed: 03/04/2021 Elsevier Patient Education  2024 [employer].

## 2024-04-23 NOTE — Progress Notes (Unsigned)
 I,[name], CMA,acting as a neurosurgeon for [name], MD.,have documented all relevant documentation on the behalf of [name], MD,as directed by  [name], MD while in the presence of [name], MD.  Subjective:  Patient ID: Rebecca Nicholson , female    DOB: Oct 24, 1995 , 28 y.o.   MRN: [id number]  Chief Complaint  Patient presents with   Hypertension    Patient presents today for bp & predm follow up. She reports compliance with medications. Denies headache, chest pain & sob. She recently went to ED for abdominal side pain. Ovarian cyst found after completing ultrasound. She followed up with GYN. They want her to wait 6 weeks to complete another ultrasound to see if this goes away on its own. Patient denies any pain currently.    Prediabetes    HPI Discussed the use of AI scribe software for clinical note transcription with the patient, who gave verbal consent to proceed.  History of Present Illness Rebecca Nicholson is a 28 year old female with hypertension who presents for a blood pressure check.  She has recently visited the emergency room twice, on October 16th and November 6th, due to pain associated with an ovarian cyst. The cyst is located on the right side, and the pain is exacerbated by physical exertion, such as playing with children at recess. She is not currently taking any medication for the pain and finds that avoiding overexertion helps manage her symptoms.  She has a history of low vitamin D  levels and slight anemia. She is not taking her vitamin D  and iron supplements as prescribed. She is also not taking metformin  and reports regular menstrual cycles, although they are troublesome.  She is currently using amlodipine  for hypertension.  She uses Symbicort  for breathing issues but has not refilled her prescription since March, as she had a large supply. Her breathing is fine and she has not experienced any recent issues.  No constipation  or other significant symptoms were reported during the review of systems.   Hypertension This is a new problem. The current episode started more than 1 month ago. Pertinent negatives include no blurred vision, chest pain, palpitations or shortness of breath. Risk factors for coronary artery disease include sedentary lifestyle. Past treatments include calcium channel blockers.     Past Medical History:  Diagnosis Date   Anemia    Asthma    as child   Chronic rhinitis 03/14/2018   Elevated LDL cholesterol level 01/29/2022   LDL 103 in March 2023. She is encouraged to limit her intake of fried foods, aim for at least 150 minutes of exercise per week and to increase fiber intake.    Hidradenitis axillaris 07/04/2022   Hidradenitis suppurativa of right axilla    Hypercholesteremia    Medical history non-contributory    Moderate persistent asthma with acute exacerbation 11/18/2019   Moderate persistent asthma without complication 03/14/2018   Pre-diabetes    Weight gain 11/18/2019     Family History  Problem Relation Age of Onset   Colon cancer Maternal Grandmother    Breast cancer Maternal Grandmother    Cancer Father    Hypertension Mother    Diabetes Mother    Diabetes Maternal Aunt    Esophageal cancer Neg Hx    Rectal cancer Neg Hx    Stomach cancer Neg Hx      Current Outpatient Medications:    albuterol  (PROVENTIL ) (2.5 MG/3ML) 0.083% nebulizer solution, Take 3  mLs (2.5 mg total) by nebulization every 4 (four) hours as needed for wheezing or shortness of breath., Disp: 75 mL, Rfl: 2   albuterol  (VENTOLIN  HFA) 108 (90 Base) MCG/ACT inhaler, Inhale 2 puffs into the lungs every 6 (six) hours as needed for wheezing or shortness of breath., Disp: 3 each, Rfl: 3   amLODipine  (NORVASC ) 2.5 MG tablet, Take 1 tablet (2.5 mg total) by mouth daily., Disp: 90 tablet, Rfl: 2   Blood Pressure Monitoring (BLOOD PRESSURE KIT) DEVI, 1 Device by Does not apply route once a week., Disp: 1  each, Rfl: 0   budesonide -formoterol  (SYMBICORT ) 80-4.5 MCG/ACT inhaler, Inhale 2 puffs into the lungs 2 (two) times daily., Disp: 3 each, Rfl: 3   cyclobenzaprine  (FLEXERIL ) 10 MG tablet, Take 1 tablet (10 mg total) by mouth 3 (three) times daily as needed for muscle spasms., Disp: 30 tablet, Rfl: 0   fluconazole  (DIFLUCAN ) 150 MG tablet, Take 1 tablet (150 mg total) by mouth every three (3) days as needed., Disp: 3 tablet, Rfl: 0   montelukast  (SINGULAIR ) 10 MG tablet, Take 1 tablet (10 mg total) by mouth daily., Disp: 90 tablet, Rfl: 1   mupirocin  ointment (BACTROBAN ) 2 %, Apply 1 Application topically 2 (two) times daily., Disp: 22 g, Rfl: 0   naproxen  (NAPROSYN ) 500 MG tablet, Take 1 tablet (500 mg total) by mouth 2 (two) times daily with a meal., Disp: 30 tablet, Rfl: 0   nitrofurantoin , macrocrystal-monohydrate, (MACROBID ) 100 MG capsule, Take 1 capsule (100 mg total) by mouth 2 (two) times daily., Disp: 10 capsule, Rfl: 0   ondansetron  (ZOFRAN -ODT) 4 MG disintegrating tablet, 4mg  ODT q4 hours prn nausea/vomit, Disp: 10 tablet, Rfl: 0   ondansetron  (ZOFRAN -ODT) 4 MG disintegrating tablet, Take 1 tablet (4 mg total) by mouth every 8 (eight) hours as needed for nausea or vomiting., Disp: 20 tablet, Rfl: 0   Vitamin D , Ergocalciferol , (DRISDOL ) 1.25 MG (50000 UNIT) CAPS capsule, Take 1 capsule (50,000 Units total) by mouth 2 (two) times a week., Disp: 24 capsule, Rfl: 1   Allergies  Allergen Reactions   Cephalosporins Rash     Review of Systems  Constitutional: Negative.   Eyes:  Negative for blurred vision.  Respiratory: Negative.  Negative for shortness of breath.   Cardiovascular: Negative.  Negative for chest pain and palpitations.  Neurological: Negative.   Psychiatric/Behavioral: Negative.       Today's Vitals   04/23/24 0951  BP: 118/80  Pulse: 85  Temp: 98.3 F (36.8 C)  SpO2: 98%  Weight: 248 lb 12.8 oz (112.9 kg)  Height: 5' 7 (1.702 m)   Body mass index is 38.97  kg/m.  Wt Readings from Last 3 Encounters:  04/23/24 248 lb 12.8 oz (112.9 kg)  04/04/24 250 lb (113.4 kg)  12/12/23 255 lb (115.7 kg)     Objective:  Physical Exam Vitals and nursing note reviewed.  Constitutional:      Appearance: Normal appearance. She is obese.  HENT:     Head: Normocephalic and atraumatic.  Eyes:     Extraocular Movements: Extraocular movements intact.  Cardiovascular:     Rate and Rhythm: Normal rate and regular rhythm.     Heart sounds: Normal heart sounds.  Pulmonary:     Effort: Pulmonary effort is normal.     Breath sounds: Normal breath sounds.  Musculoskeletal:     Cervical back: Normal range of motion.  Skin:    General: Skin is warm.  Neurological:  General: No focal deficit present.     Mental Status: She is alert.  Psychiatric:        Mood and Affect: Mood normal.        Behavior: Behavior normal.         Assessment And Plan:  Essential hypertension, benign Assessment & Plan: Chronic, well controlled.  Hypertension managed with amlodipine . Goal to discontinue medication through weight loss. - Encouraged weight loss of 24 pounds (10% of body weight) to potentially discontinue amlodipine .   Pre-diabetes Assessment & Plan: Previous labs reviewed, her A1c has been elevated in the past. I will check an A1c today. Reminded to avoid refined sugars including sugary drinks/foods and processed meats including bacon, sausages and deli meats.    Orders: -     Hemoglobin A1c  Cyst of right ovary Assessment & Plan: Recent ER visits for pain due to right ovarian cyst. ER notes reviewed in detail.  GYN follow-up planned with repeat ultrasound in six weeks. - Advised to avoid overexertion to prevent pain exacerbation. - Continue follow-up with GYN for repeat ultrasound in six weeks.   Iron deficiency anemia due to chronic blood loss Assessment & Plan: Slightly anemic with irregular iron supplementation. Recent blood count checked in  ER. - Ordered iron panel to assess current status.  Orders: -     Iron, TIBC and Ferritin Panel  Vitamin D  deficiency disease Assessment & Plan: Previously low vitamin D  levels. Not taking supplements as recommended. - Ordered vitamin D  level test. - Recommended over-the-counter vitamin D  supplementation, likely 2000-5000 units daily.  Orders: -     VITAMIN D  25 Hydroxy (Vit-D Deficiency, Fractures)    Return if symptoms worsen or fail to improve.  Patient was given opportunity to ask questions. Patient verbalized understanding of the plan and was able to repeat key elements of the plan. All questions were answered to their satisfaction.   I, [name], MD, have reviewed all documentation for this visit. The documentation on 04/30/24 for the exam, diagnosis, procedures, and orders are all accurate and complete.   IF YOU HAVE BEEN REFERRED TO A SPECIALIST, IT MAY TAKE 1-2 WEEKS TO SCHEDULE/PROCESS THE REFERRAL. IF YOU HAVE NOT HEARD FROM US /SPECIALIST IN TWO WEEKS, PLEASE GIVE US  A CALL AT [phone] X [phone].   THE PATIENT IS ENCOURAGED TO PRACTICE SOCIAL DISTANCING DUE TO THE COVID-19 PANDEMIC.

## 2024-04-24 ENCOUNTER — Ambulatory Visit: Payer: Self-pay | Admitting: Internal Medicine

## 2024-04-24 LAB — HEMOGLOBIN A1C
Est. average glucose Bld gHb Est-mCnc: 111 mg/dL
Hgb A1c MFr Bld: 5.5 % (ref 4.8–5.6)

## 2024-04-24 LAB — VITAMIN D 25 HYDROXY (VIT D DEFICIENCY, FRACTURES): Vit D, 25-Hydroxy: 8.9 ng/mL — ABNORMAL LOW (ref 30.0–100.0)

## 2024-04-24 LAB — IRON,TIBC AND FERRITIN PANEL
Ferritin: 16 ng/mL (ref 15–150)
Iron Saturation: 8 % — CL (ref 15–55)
Iron: 28 ug/dL (ref 27–159)
Total Iron Binding Capacity: 337 ug/dL (ref 250–450)
UIBC: 309 ug/dL (ref 131–425)

## 2024-05-04 DIAGNOSIS — N83201 Unspecified ovarian cyst, right side: Secondary | ICD-10-CM | POA: Insufficient documentation

## 2024-05-04 NOTE — Assessment & Plan Note (Signed)
 Chronic, well controlled.  Hypertension managed with amlodipine . Goal to discontinue medication through weight loss. - Encouraged weight loss of 24 pounds (10% of body weight) to potentially discontinue amlodipine .

## 2024-05-04 NOTE — Assessment & Plan Note (Addendum)
 Previously low vitamin D  levels. Not taking supplements as recommended. - Ordered vitamin D  level test. - Recommended over-the-counter vitamin D  supplementation, likely 2000-5000 units daily.

## 2024-05-04 NOTE — Assessment & Plan Note (Signed)
 Recent ER visits for pain due to right ovarian cyst. ER notes reviewed in detail.  GYN follow-up planned with repeat ultrasound in six weeks. - Advised to avoid overexertion to prevent pain exacerbation. - Continue follow-up with GYN for repeat ultrasound in six weeks.

## 2024-05-04 NOTE — Assessment & Plan Note (Addendum)
 Previous labs reviewed, her A1c has been elevated in the past. I will check an A1c today. Reminded to avoid refined sugars including sugary drinks/foods and processed meats including bacon, sausages and deli meats.

## 2024-05-04 NOTE — Assessment & Plan Note (Signed)
 Slightly anemic with irregular iron supplementation. Recent blood count checked in ER. - Ordered iron panel to assess current status.

## 2024-05-24 ENCOUNTER — Emergency Department (HOSPITAL_COMMUNITY)
Admission: EM | Admit: 2024-05-24 | Discharge: 2024-05-25 | Discharge status: Against Medical Advice | Attending: Emergency Medicine | Admitting: Emergency Medicine

## 2024-05-24 ENCOUNTER — Encounter (HOSPITAL_COMMUNITY): Payer: Self-pay | Admitting: Emergency Medicine

## 2024-05-24 ENCOUNTER — Other Ambulatory Visit: Payer: Self-pay

## 2024-05-24 DIAGNOSIS — R1030 Lower abdominal pain, unspecified: Secondary | ICD-10-CM | POA: Insufficient documentation

## 2024-05-24 DIAGNOSIS — R110 Nausea: Secondary | ICD-10-CM | POA: Insufficient documentation

## 2024-05-24 DIAGNOSIS — Z5321 Procedure and treatment not carried out due to patient leaving prior to being seen by health care provider: Secondary | ICD-10-CM | POA: Diagnosis not present

## 2024-05-24 LAB — COMPREHENSIVE METABOLIC PANEL WITH GFR
ALT: 11 U/L (ref 0–44)
AST: 16 U/L (ref 15–41)
Albumin: 4.5 g/dL (ref 3.5–5.0)
Alkaline Phosphatase: 60 U/L (ref 38–126)
Anion gap: 9 (ref 5–15)
BUN: 16 mg/dL (ref 6–20)
CO2: 29 mmol/L (ref 22–32)
Calcium: 9.9 mg/dL (ref 8.9–10.3)
Chloride: 101 mmol/L (ref 98–111)
Creatinine, Ser: 0.64 mg/dL (ref 0.44–1.00)
GFR, Estimated: >60 mL/min
Glucose, Bld: 108 mg/dL — ABNORMAL HIGH (ref 70–99)
Potassium: 3.6 mmol/L (ref 3.5–5.1)
Sodium: 139 mmol/L (ref 135–145)
Total Bilirubin: 0.2 mg/dL (ref 0.0–1.2)
Total Protein: 7.7 g/dL (ref 6.5–8.1)

## 2024-05-24 LAB — CBC
HCT: 39.7 % (ref 36.0–46.0)
Hemoglobin: 12 g/dL (ref 12.0–15.0)
MCH: 22.8 pg — ABNORMAL LOW (ref 26.0–34.0)
MCHC: 30.2 g/dL (ref 30.0–36.0)
MCV: 75.5 fL — ABNORMAL LOW (ref 80.0–100.0)
Platelets: 366 K/uL (ref 150–400)
RBC: 5.26 MIL/uL — ABNORMAL HIGH (ref 3.87–5.11)
RDW: 16.8 % — ABNORMAL HIGH (ref 11.5–15.5)
WBC: 7.6 K/uL (ref 4.0–10.5)
nRBC: 0 % (ref 0.0–0.2)

## 2024-05-24 LAB — LIPASE, BLOOD: Lipase: 10 U/L — ABNORMAL LOW (ref 11–51)

## 2024-05-24 LAB — HCG, SERUM, QUALITATIVE: Preg, Serum: NEGATIVE

## 2024-05-24 NOTE — ED Triage Notes (Signed)
 Pt reports lower abd pain x 1 month. Reports nausea but denies v/d. Reports she was dx w/ ovarian cyst and pain feels the same.

## 2024-06-15 ENCOUNTER — Encounter: Payer: Self-pay | Admitting: Internal Medicine

## 2024-06-17 ENCOUNTER — Ambulatory Visit

## 2024-06-17 VITALS — BP 120/70 | HR 75 | Temp 98.7°F | Ht 67.0 in | Wt 261.6 lb

## 2024-06-17 DIAGNOSIS — J069 Acute upper respiratory infection, unspecified: Secondary | ICD-10-CM

## 2024-06-17 DIAGNOSIS — Z1159 Encounter for screening for other viral diseases: Secondary | ICD-10-CM

## 2024-06-17 DIAGNOSIS — R051 Acute cough: Secondary | ICD-10-CM

## 2024-06-17 LAB — POC SOFIA 2 FLU + SARS ANTIGEN FIA
Influenza A, POC: NEGATIVE
Influenza B, POC: NEGATIVE
SARS Coronavirus 2 Ag: NEGATIVE

## 2024-06-17 MED ORDER — PREDNISONE 20 MG PO TABS: 20.0000 mg | ORAL_TABLET | Freq: Every day | ORAL | 0 refills | Status: AC

## 2024-06-17 NOTE — Progress Notes (Signed)
 "  Acute Office Visit  Subjective:     Patient ID: Rebecca Nicholson, female    DOB: [birth year], 29 y.o.   MRN: [id number]  Chief Complaint  Patient presents with   Cough    Patient presents today for a cough. Patient reports she had a bad cold about a week ago. Patient reports her cough hasn't gotten better. She reports she was seen at [facility] last week and covid and flu test were negative. All her other cold symptoms have gotten better. Patient has tried OTC medications no relief.     This is a 29 year old African-American female who is here today reports a 1 week history of having a nonproductive cough.  She did go to urgent care 1 week ago and was tested for influenza and strep which were both negative.  She did have a headache 2 days ago but denies any today.  She does have the sniffles.  She denies any dizziness, headaches, fevers, chills, wheezing, shortness of breath or chest tightness.  She does have a history of ASTHMA.  She has been taking over-the-counter Mucinex DM.    Review of Systems  Constitutional:  Negative for chills, fever and malaise/fatigue.  HENT:  Negative for sore throat.        Sniffles  Respiratory:  Positive for cough. Negative for sputum production, shortness of breath and wheezing.        H/O asthma  Neurological:  Positive for headaches. Negative for dizziness.        Objective:    Vitals:   06/17/24 1435  BP: 120/70  Pulse: 75  Temp: 98.7 F (37.1 C)  Height: 5' 7 (1.702 m)  Weight: 261 lb 9.6 oz (118.7 kg)  TempSrc: Oral  BMI (Calculated): 40.96      Physical Exam Vitals reviewed.  Constitutional:      Appearance: Normal appearance.  HENT:     Head: Normocephalic and atraumatic.     Right Ear: Tympanic membrane, ear canal and external ear normal.     Left Ear: Tympanic membrane, ear canal and external ear normal.     Nose:     Left Turbinates: Swollen.     Right Sinus: No maxillary sinus tenderness or frontal sinus tenderness.      Left Sinus: No maxillary sinus tenderness or frontal sinus tenderness.  Cardiovascular:     Rate and Rhythm: Normal rate and regular rhythm.     Pulses: Normal pulses.     Heart sounds: Normal heart sounds.  Pulmonary:     Effort: Pulmonary effort is normal.     Breath sounds: Normal breath sounds. No wheezing.  Musculoskeletal:     Cervical back: Normal range of motion and neck supple.  Neurological:     Mental Status: She is alert.     Results for orders placed or performed in visit on 06/17/24  POC SOFIA 2 FLU + SARS ANTIGEN FIA  Result Value Ref Range   Influenza A, POC Negative Negative   Influenza B, POC Negative Negative   SARS Coronavirus 2 Ag Negative Negative        Assessment & Plan:   Assessment & Plan Acute cough I sent in prednisone  20 mg once a day for 5 days on 06/17/2024. Orders:   POC SOFIA 2 FLU + SARS ANTIGEN FIA  Encounter for screening for other viral diseases The rapid COVID-19 test and rapid influenza A/B test done on 06/17/2024 were negative.    Viral URI with cough  I recommend that this patient take over-the-counter zinc 50 mg once a day with food for 7 days and over-the-counter vitamin C 500 mg twice a day for 7 days.  She was told to call back if she does not improve by 06/24/2024.  She voiced understanding.     Return for keep same next.  Gaither JONELLE Fischer, DO   "

## 2024-06-17 NOTE — Progress Notes (Signed)
 LILLETTE Kristeen JINNY Gladis, CMA,acting as a scribe for Gaither JONELLE Fischer, DO.,have documented all relevant documentation on the behalf of Gaither JONELLE Fischer, DO,as directed by  Gaither JONELLE Fischer, DO while in the presence of Gaither JONELLE Fischer, DO.  Subjective:  Patient ID: Rebecca Nicholson , female    DOB: March 28, 1996 , 29 y.o.   MRN: [id number]  No chief complaint on file.   HPI  HPI   Past Medical History:  Diagnosis Date   Anemia    Asthma    as child   Chronic rhinitis 03/14/2018   Elevated LDL cholesterol level 01/29/2022   LDL 103 in March 2023. She is encouraged to limit her intake of fried foods, aim for at least 150 minutes of exercise per week and to increase fiber intake.    Hidradenitis axillaris 07/04/2022   Hidradenitis suppurativa of right axilla    Hypercholesteremia    Medical history non-contributory    Moderate persistent asthma with acute exacerbation 11/18/2019   Moderate persistent asthma without complication 03/14/2018   Pre-diabetes    Weight gain 11/18/2019     Family History  Problem Relation Age of Onset   Colon cancer Maternal Grandmother    Breast cancer Maternal Grandmother    Cancer Father    Hypertension Mother    Diabetes Mother    Diabetes Maternal Aunt    Esophageal cancer Neg Hx    Rectal cancer Neg Hx    Stomach cancer Neg Hx     Current Medications[1]   Allergies[2]   Review of Systems   There were no vitals filed for this visit. There is no height or weight on file to calculate BMI.  Wt Readings from Last 3 Encounters:  04/23/24 248 lb 12.8 oz (112.9 kg)  04/04/24 250 lb (113.4 kg)  12/12/23 255 lb (115.7 kg)    The ASCVD Risk score (Arnett DK, et al., 2019) failed to calculate for the following reasons:   The 2019 ASCVD risk score is only valid for ages 71 to 45   * - Cholesterol units were assumed  Objective:  Physical Exam      Assessment And Plan:   Assessment & Plan Acute cough  Encounter for screening for other viral  diseases  Class 3 severe obesity due to excess calories with body mass index (BMI) of 40.0 to 44.9 in adult, unspecified whether serious comorbidity present (HCC)   No orders of the defined types were placed in this encounter.    No follow-ups on file.  Patient was given opportunity to ask questions. Patient verbalized understanding of the plan and was able to repeat key elements of the plan. All questions were answered to their satisfaction.    LILLETTE Gaither JONELLE Fischer, DO, have reviewed all documentation for this visit. The documentation on 06/17/24 for the exam, diagnosis, procedures, and orders are all accurate and complete.   IF YOU HAVE BEEN REFERRED TO A SPECIALIST, IT MAY TAKE 1-2 WEEKS TO SCHEDULE/PROCESS THE REFERRAL. IF YOU HAVE NOT HEARD FROM US /SPECIALIST IN TWO WEEKS, PLEASE GIVE US  A CALL AT [phone].     [1]  Current Outpatient Medications:    albuterol  (PROVENTIL ) (2.5 MG/3ML) 0.083% nebulizer solution, Take 3 mLs (2.5 mg total) by nebulization every 4 (four) hours as needed for wheezing or shortness of breath., Disp: 75 mL, Rfl: 2   albuterol  (VENTOLIN  HFA) 108 (90 Base) MCG/ACT inhaler, Inhale 2 puffs into the lungs every 6 (six) hours as needed for  wheezing or shortness of breath., Disp: 3 each, Rfl: 3   amLODipine  (NORVASC ) 2.5 MG tablet, Take 1 tablet (2.5 mg total) by mouth daily., Disp: 90 tablet, Rfl: 2   Blood Pressure Monitoring (BLOOD PRESSURE KIT) DEVI, 1 Device by Does not apply route once a week., Disp: 1 each, Rfl: 0   budesonide -formoterol  (SYMBICORT ) 80-4.5 MCG/ACT inhaler, Inhale 2 puffs into the lungs 2 (two) times daily., Disp: 3 each, Rfl: 3   cyclobenzaprine  (FLEXERIL ) 10 MG tablet, Take 1 tablet (10 mg total) by mouth 3 (three) times daily as needed for muscle spasms., Disp: 30 tablet, Rfl: 0   fluconazole  (DIFLUCAN ) 150 MG tablet, Take 1 tablet (150 mg total) by mouth every three (3) days as needed., Disp: 3 tablet, Rfl: 0   montelukast   (SINGULAIR ) 10 MG tablet, Take 1 tablet (10 mg total) by mouth daily., Disp: 90 tablet, Rfl: 1   mupirocin  ointment (BACTROBAN ) 2 %, Apply 1 Application topically 2 (two) times daily., Disp: 22 g, Rfl: 0   naproxen  (NAPROSYN ) 500 MG tablet, Take 1 tablet (500 mg total) by mouth 2 (two) times daily with a meal., Disp: 30 tablet, Rfl: 0   nitrofurantoin , macrocrystal-monohydrate, (MACROBID ) 100 MG capsule, Take 1 capsule (100 mg total) by mouth 2 (two) times daily., Disp: 10 capsule, Rfl: 0   ondansetron  (ZOFRAN -ODT) 4 MG disintegrating tablet, 4mg  ODT q4 hours prn nausea/vomit, Disp: 10 tablet, Rfl: 0   ondansetron  (ZOFRAN -ODT) 4 MG disintegrating tablet, Take 1 tablet (4 mg total) by mouth every 8 (eight) hours as needed for nausea or vomiting., Disp: 20 tablet, Rfl: 0   Vitamin D , Ergocalciferol , (DRISDOL ) 1.25 MG (50000 UNIT) CAPS capsule, Take 1 capsule (50,000 Units total) by mouth 2 (two) times a week., Disp: 24 capsule, Rfl: 1 [2]  Allergies Allergen Reactions   Cephalosporins Rash

## 2024-06-18 NOTE — Addendum Note (Signed)
 Addended by: BERNARDO CARWIN on: 06/18/2024 10:46 AM   Modules accepted: Level of Service

## 2024-07-08 ENCOUNTER — Encounter: Admitting: Internal Medicine

## 2024-07-10 ENCOUNTER — Encounter: Payer: Self-pay | Admitting: Internal Medicine
